# Patient Record
Sex: Male | Born: 1990
Health system: Southern US, Community
[De-identification: ages and names within clinical notes are randomized; demographics above are authoritative.]

## PROBLEM LIST (undated history)

## (undated) DIAGNOSIS — E669 Obesity, unspecified: Secondary | ICD-10-CM

## (undated) DIAGNOSIS — R6250 Unspecified lack of expected normal physiological development in childhood: Secondary | ICD-10-CM

## (undated) DIAGNOSIS — G40909 Epilepsy, unspecified, not intractable, without status epilepticus: Secondary | ICD-10-CM

## (undated) DIAGNOSIS — R7302 Impaired glucose tolerance (oral): Secondary | ICD-10-CM

## (undated) DIAGNOSIS — F79 Unspecified intellectual disabilities: Secondary | ICD-10-CM

## (undated) HISTORY — DX: Obesity, unspecified: E66.9

## (undated) HISTORY — DX: Epilepsy, unspecified, not intractable, without status epilepticus: G40.909

## (undated) HISTORY — DX: Impaired glucose tolerance (oral): R73.02

## (undated) HISTORY — PX: NO PAST SURGERIES: SHX2092

## (undated) HISTORY — DX: Unspecified lack of expected normal physiological development in childhood: R62.50

## (undated) HISTORY — DX: Unspecified intellectual disabilities: F79

---
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
## 2011-05-06 LAB — CBC
Hemoglobin: 13
MCHC: 33.6
RBC: 4.72
WBC: 15.3 — ABNORMAL HIGH

## 2011-05-06 LAB — BASIC METABOLIC PANEL
CO2: 25
Calcium: 9.3
Creatinine, Ser: 0.75
Sodium: 137

## 2011-05-06 LAB — DIFFERENTIAL
Basophils Relative: 0
Lymphocytes Relative: 5 — ABNORMAL LOW
Monocytes Absolute: 1.4 — ABNORMAL HIGH
Monocytes Relative: 9
Neutro Abs: 13 — ABNORMAL HIGH
Neutrophils Relative %: 85 — ABNORMAL HIGH

## 2011-05-06 LAB — URINALYSIS, ROUTINE W REFLEX MICROSCOPIC
Hgb urine dipstick: NEGATIVE
Nitrite: NEGATIVE
Specific Gravity, Urine: >1.03 — ABNORMAL HIGH
Urobilinogen, UA: 0.2
pH: 6

## 2011-05-06 LAB — CULTURE, BLOOD (ROUTINE X 2): Culture: NO GROWTH

## 2011-09-14 ENCOUNTER — Encounter: Payer: Self-pay | Admitting: Internal Medicine

## 2011-09-14 DIAGNOSIS — Z0000 Encounter for general adult medical examination without abnormal findings: Secondary | ICD-10-CM | POA: Insufficient documentation

## 2011-09-14 DIAGNOSIS — E669 Obesity, unspecified: Secondary | ICD-10-CM

## 2011-09-14 DIAGNOSIS — G40909 Epilepsy, unspecified, not intractable, without status epilepticus: Secondary | ICD-10-CM

## 2011-09-14 DIAGNOSIS — R6250 Unspecified lack of expected normal physiological development in childhood: Secondary | ICD-10-CM

## 2011-09-14 DIAGNOSIS — F79 Unspecified intellectual disabilities: Secondary | ICD-10-CM

## 2011-09-14 HISTORY — DX: Epilepsy, unspecified, not intractable, without status epilepticus: G40.909

## 2011-09-14 HISTORY — DX: Unspecified lack of expected normal physiological development in childhood: R62.50

## 2011-09-14 HISTORY — DX: Unspecified intellectual disabilities: F79

## 2011-09-14 HISTORY — DX: Obesity, unspecified: E66.9

## 2011-09-17 ENCOUNTER — Ambulatory Visit (INDEPENDENT_AMBULATORY_CARE_PROVIDER_SITE_OTHER): Payer: Medicare Other | Admitting: Internal Medicine

## 2011-09-17 ENCOUNTER — Encounter: Payer: Self-pay | Admitting: Internal Medicine

## 2011-09-17 VITALS — BP 104/70 | HR 60 | Temp 98.7°F | Ht 61.0 in | Wt 232.5 lb

## 2011-09-17 DIAGNOSIS — R21 Rash and other nonspecific skin eruption: Secondary | ICD-10-CM

## 2011-09-17 DIAGNOSIS — H1010 Acute atopic conjunctivitis, unspecified eye: Secondary | ICD-10-CM | POA: Insufficient documentation

## 2011-09-17 DIAGNOSIS — J019 Acute sinusitis, unspecified: Secondary | ICD-10-CM

## 2011-09-17 DIAGNOSIS — H1045 Other chronic allergic conjunctivitis: Secondary | ICD-10-CM

## 2011-09-17 MED ORDER — TRIAMCINOLONE ACETONIDE 0.1 % EX CREA: TOPICAL_CREAM | Freq: Two times a day (BID) | CUTANEOUS | Status: AC

## 2011-09-17 MED ORDER — OLOPATADINE HCL 0.1 % OP SOLN: 1.0000 [drp] | Freq: Two times a day (BID) | OPHTHALMIC | Status: AC

## 2011-09-17 MED ORDER — CEFDINIR 300 MG PO CAPS: 300.0000 mg | ORAL_CAPSULE | Freq: Two times a day (BID) | ORAL | Status: AC

## 2011-09-17 NOTE — Patient Instructions (Signed)
Take all new medications as prescribed Continue all other medications as before  

## 2011-09-22 ENCOUNTER — Encounter: Payer: Self-pay | Admitting: Internal Medicine

## 2011-09-22 NOTE — Assessment & Plan Note (Signed)
Mild to mod, for triam cream prn  course,  to f/u any worsening symptoms or concerns

## 2011-09-22 NOTE — Assessment & Plan Note (Signed)
Mild to mod, for patanol course,  to f/u any worsening symptoms or concerns

## 2011-09-22 NOTE — Progress Notes (Signed)
  Subjective:    Patient ID: Scott Curtis, male    DOB: 10-27-1990, 21 y.o.   MRN: [id number]  HPI  Here as new pt with mother;  Here with 3 days acute onset fever, facial pain, pressure, general weakness and malaise, and greenish d/c, with slight ST, but little to no cough and Pt denies chest pain, increased sob or doe, wheezing, orthopnea, PND, increased LE swelling, palpitations, dizziness or syncope. Does have several wks ongoing nasal  And eye allergy symptoms with clear congestion, itch and sneeze, without fever, pain, ST, cough or wheezing.Also with rash itchy to left knee, tends to spend quite a bit of time on his knees at home. Pt denies new neurological symptoms such as new headache, or facial or extremity weakness or numbness   Pt denies polydipsia, polyuria.  Past Medical History  Diagnosis Date  . Seizure disorder 09/14/2011  . Development delay 09/14/2011  . Mental retardation 09/14/2011  . Obesity 09/14/2011   No past surgical history on file.  reports that he has never smoked. He does not have any smokeless tobacco history on file. He reports that he does not drink alcohol or use illicit drugs. family history includes Arthritis in his other; Cancer in his other; and Hypertension in his other. Allergies  Allergen Reactions  . Codeine    No current outpatient prescriptions on file prior to visit.   Review of Systems Review of Systems  Constitutional: Negative for diaphoresis and unexpected weight change.  HENT: Negative for drooling and tinnitus.   Eyes: Negative for photophobia and visual disturbance.  Respiratory: Negative for choking and stridor.   Gastrointestinal: Negative for vomiting and blood in stool.  Genitourinary: Negative for hematuria and decreased urine volume.    Objective:   Physical Exam BP 104/70  Pulse 60  Temp(Src) 98.7 F (37.1 C) (Oral)  Ht 5\' 1"  (1.549 m)  Wt 232 lb 8 oz (105.461 kg)  BMI 43.93 kg/m2  SpO2 99% Physical Exam  VS noted, mild  ill Constitutional: Pt appears well-developed and well-nourished.  HENT: Head: Normocephalic.  Right Ear: External ear normal.  Left Ear: External ear normal. bilat conjucnt with mild watery d/c, mild redness bilat Bilat tm's mild erythema.  Sinus tender bilat.  Pharynx mild erythema Eyes: Conjunctivae and EOM are normal. Pupils are equal, round, and reactive to light.  Neck: Normal range of motion. Neck supple.  Cardiovascular: Normal rate and regular rhythm.   Pulmonary/Chest: Effort normal and breath sounds normal.  Abd:  Soft, NT, non-distended, + BS Neurological: Pt is alert. No cranial nerve deficit.  Skin: Skin is warm. No erythema.  Psychiatric: Pt behavior is normal. Thought content c/w developmental delay   Assessment & Plan:

## 2011-09-22 NOTE — Assessment & Plan Note (Signed)
Mild to mod, for antibx course,  to f/u any worsening symptoms or concerns 

## 2012-09-24 ENCOUNTER — Ambulatory Visit (INDEPENDENT_AMBULATORY_CARE_PROVIDER_SITE_OTHER): Payer: Medicare Other | Admitting: Internal Medicine

## 2012-09-24 ENCOUNTER — Encounter: Payer: Self-pay | Admitting: Internal Medicine

## 2012-09-24 VITALS — BP 102/70 | HR 103 | Temp 98.4°F | Ht 61.0 in | Wt 232.0 lb

## 2012-09-24 DIAGNOSIS — R7302 Impaired glucose tolerance (oral): Secondary | ICD-10-CM

## 2012-09-24 HISTORY — DX: Impaired glucose tolerance (oral): R73.02

## 2012-09-24 MED ORDER — DOXYCYCLINE HYCLATE 100 MG PO TABS: 100.0000 mg | ORAL_TABLET | Freq: Two times a day (BID) | ORAL | Status: DC

## 2012-09-24 NOTE — Patient Instructions (Addendum)
Please take all new medication as prescribed Please continue all other medications as before Please call if you would need to be referred to dermatology Please go to the LAB in the Basement (turn left off the elevator) for the tests to be done at your convenience You will be contacted by phone if any changes need to be made immediately.  Otherwise, you will receive a letter about your results with an explanation, but please check with MyChart first. Thank you for enrolling in MyChart. Please follow the instructions below to securely access your online medical record. MyChart allows you to send messages to your doctor, view your test results, renew your prescriptions, schedule appointments, and more. To Log into My Chart online, please go by [employer] or [employer] to [email or web address], or download the MyChart App from the [employer] of [employer] .  Your Username is: [email or web address] ([email or web address]) Please send a practice Message on Mychart later today. Please return in 1 year for your yearly visit, or sooner if needed

## 2012-09-24 NOTE — Assessment & Plan Note (Signed)

## 2012-09-24 NOTE — Assessment & Plan Note (Signed)
Asympt, for a1c 

## 2012-09-24 NOTE — Progress Notes (Signed)
  Subjective:    Patient ID: Scott Curtis, male    DOB: 1990-10-18, 22 y.o.   MRN: [id number]  HPI  Here with mother, Here for wellness and f/u;  Overall doing ok;  Pt denies CP, worsening SOB, DOE, wheezing, orthopnea, PND, worsening LE edema, palpitations, dizziness or syncope.  Pt denies neurological change such as new headache, facial or extremity weakness.  Pt denies polydipsia, polyuria, or low sugar symptoms. Pt states overall good compliance with treatment and medications, good tolerability, and has been trying to follow lower cholesterol diet.  Pt denies worsening depressive symptoms, suicidal ideation or panic. No fever, night sweats, wt loss, loss of appetite, or other constitutional symptoms.  Pt states good ability with ADL's, has low fall risk, home safety reviewed and adequate, no other significant changes in hearing or vision, and essentially no exercise as he spends all of his time playing video games Past Medical History  Diagnosis Date  . Seizure disorder 09/14/2011  . Development delay 09/14/2011  . Mental retardation 09/14/2011  . Obesity 09/14/2011  . Impaired glucose tolerance 09/24/2012   No past surgical history on file.  reports that he has never smoked. He does not have any smokeless tobacco history on file. He reports that he does not drink alcohol or use illicit drugs. family history includes Arthritis in his other; Cancer in his other; and Hypertension in his other. Allergies  Allergen Reactions  . Codeine    No current outpatient prescriptions on file prior to visit.   No current facility-administered medications on file prior to visit.   Review of Systems Constitutional: Negative for diaphoresis, activity change, appetite change or unexpected weight change.  HENT: Negative for hearing loss, ear pain, facial swelling, mouth sores and neck stiffness.   Eyes: Negative for pain, redness and visual disturbance.  Respiratory: Negative for shortness of breath and  wheezing.   Cardiovascular: Negative for chest pain and palpitations.  Gastrointestinal: Negative for diarrhea, blood in stool, abdominal distention or other pain Genitourinary: Negative for hematuria, flank pain or change in urine volume.  Musculoskeletal: Negative for myalgias and joint swelling.  Skin: Negative for color change and wound.  Neurological: Negative for syncope and numbness. other than noted Hematological: Negative for adenopathy.  Psychiatric/Behavioral: Negative for hallucinations, self-injury, decreased concentration and agitation.      Objective:   Physical Exam BP 102/70  Pulse 103  Temp(Src) 98.4 F (36.9 C) (Oral)  Ht 5\' 1"  (1.549 m)  Wt 232 lb (105.235 kg)  BMI 43.86 kg/m2  SpO2 94% VS noted,  Constitutional: Pt is oriented to person, place, and time. Appears well-developed and well-nourished.  Head: Normocephalic and atraumatic.  Right Ear: External ear normal.  Left Ear: External ear normal.  Nose: Nose normal.  Mouth/Throat: Oropharynx is clear and moist.  Eyes: Conjunctivae and EOM are normal. Pupils are equal, round, and reactive to light.  Neck: Normal range of motion. Neck supple. No JVD present. No tracheal deviation present.  Cardiovascular: Normal rate, regular rhythm, normal heart sounds and intact distal pulses.   Pulmonary/Chest: Effort normal and breath sounds normal.  Abdominal: Soft. Bowel sounds are normal. There is no tenderness. No HSM  Musculoskeletal: Normal range of motion. Exhibits no edema.  Lymphadenopathy:  Has no cervical adenopathy.  Neurological: Pt is alert . Pt has normal reflexes. No cranial nerve deficit. Motor/gait intact Skin: Skin is warm and dry. Has marked acne to bilat upper back    Assessment & Plan:

## 2012-09-24 NOTE — Assessment & Plan Note (Signed)
Flare to back - for doxy course,  to f/u any worsening symptoms or concerns

## 2012-12-11 ENCOUNTER — Telehealth: Payer: Self-pay

## 2012-12-11 NOTE — Telephone Encounter (Signed)
The patients mother is requesting something sent in for the patients ear to [facility] [address]  He is having drainage and an odor from his ears. Call back number is [phone]

## 2012-12-11 NOTE — Telephone Encounter (Signed)
Ok to come to office for visit now

## 2012-12-11 NOTE — Telephone Encounter (Signed)
Spoke to the mother and informed of MD instructions.  PCP was full and the mother scheduled the patient to be seen tomorrow at the Saturday clinic 12/12/12.

## 2012-12-11 NOTE — Telephone Encounter (Signed)
Called the patients mother Gweneth Dimitri) left message to call back

## 2012-12-12 ENCOUNTER — Ambulatory Visit (INDEPENDENT_AMBULATORY_CARE_PROVIDER_SITE_OTHER): Payer: Medicare Other | Admitting: Family Medicine

## 2012-12-12 ENCOUNTER — Encounter: Payer: Self-pay | Admitting: Family Medicine

## 2012-12-12 VITALS — BP 118/78 | HR 68 | Temp 98.1°F | Wt 233.0 lb

## 2012-12-12 DIAGNOSIS — H6123 Impacted cerumen, bilateral: Secondary | ICD-10-CM

## 2012-12-12 DIAGNOSIS — H6120 Impacted cerumen, unspecified ear: Secondary | ICD-10-CM

## 2012-12-12 DIAGNOSIS — H60399 Other infective otitis externa, unspecified ear: Secondary | ICD-10-CM

## 2012-12-12 DIAGNOSIS — H60391 Other infective otitis externa, right ear: Secondary | ICD-10-CM

## 2012-12-12 MED ORDER — NEOMYCIN-POLYMYXIN-HC 3.5-10000-1 OT SUSP: 3.0000 [drp] | Freq: Four times a day (QID) | OTIC | Status: DC

## 2012-12-12 NOTE — Progress Notes (Signed)
Date:  12/12/2012   Name:  Scott Curtis   DOB:  1990-11-08   MRN:  [id number] Gender: male Age: 22 y.o.  Primary Physician:  [name], MD  Evaluating MD: Hannah Beat, MD   Chief Complaint: Ear Fullness   History of Present Illness:  Scott Curtis is a 22 y.o. pleasant patient who presents with the following:  R ear pain and smell; pleasant non-verbal male. R ear tugging this week and mom worried about infection. Smell coming from the R ear also. No systemic sx.  Patient Active Problem List   Diagnosis Date Noted  . Impaired glucose tolerance 09/24/2012  . Acne 09/24/2012  . Allergic conjunctivitis 09/17/2011  . Seizure disorder 09/14/2011  . Preventative health care 09/14/2011  . Development delay 09/14/2011  . Mental retardation 09/14/2011  . Obesity 09/14/2011    Past Medical History  Diagnosis Date  . Seizure disorder 09/14/2011  . Development delay 09/14/2011  . Mental retardation 09/14/2011  . Obesity 09/14/2011  . Impaired glucose tolerance 09/24/2012    No past surgical history on file.  History   Social History  . Marital Status: Single    Spouse Name: N/A    Number of Children: N/A  . Years of Education: N/A   Occupational History  . Not on file.   Social History Main Topics  . Smoking status: Never Smoker   . Smokeless tobacco: Not on file  . Alcohol Use: No  . Drug Use: No  . Sexually Active: Not on file   Other Topics Concern  . Not on file   Social History Narrative  . No narrative on file    Family History  Problem Relation Age of Onset  . Arthritis Other   . Hypertension Other   . Cancer Other     breast cancer    Allergies  Allergen Reactions  . Codeine     Medication list has been reviewed and updated.  Outpatient Prescriptions Prior to Visit  Medication Sig Dispense Refill  . doxycycline (VIBRA-TABS) 100 MG tablet Take 1 tablet (100 mg total) by mouth 2 (two) times daily.  20 tablet  0   No  facility-administered medications prior to visit.    Review of Systems:   GEN: No acute illnesses, no fevers, chills. GI: No n/v/d, eating normally Pulm: No SOB Interactive and getting along well at home.  Otherwise, ROS is as per the HPI.   Physical Examination: BP 118/78  Temp(Src) 98.1 F (36.7 C) (Oral)  Wt 233 lb (105.688 kg)  BMI 44.05 kg/m2  Ideal Body Weight:     GEN: WDWN, NAD, Non-toxic, Alert & Oriented x 3 HEENT: Atraumatic, Normocephalic. Cerumen impaction B. After cleaning, TM clear, canal swollen on R. Ears and Nose: No external deformity. EXTR: No clubbing/cyanosis/edema NEURO: Normal gait.  PSYCH: Normally interactive. Conversant. Not depressed or anxious appearing.  Calm demeanor.    Assessment and Plan:  Otitis, externa, infective, right  Cerumen impaction, bilateral  Canals cleaned of wax. R ear remains suspicious for oe.  Orders Today:  No orders of the defined types were placed in this encounter.    Updated Medication List: (Includes new medications, updates to list, dose adjustments) Meds ordered this encounter  Medications  . neomycin-polymyxin-hydrocortisone (CORTISPORIN) 3.5-10000-1 otic suspension    Sig: Place 3 drops into the right ear 4 (four) times daily.    Dispense:  10 mL    Refill:  0    Medications Discontinued: Medications  Discontinued During This Encounter  Medication Reason  . doxycycline (VIBRA-TABS) 100 MG tablet       Signed, Sharda Keddy T. Bernese Doffing, MD 12/12/2012 9:05 AM

## 2012-12-12 NOTE — Addendum Note (Signed)
Addended by: Kern Reap B on: 12/12/2012 10:20 AM   Modules accepted: Orders

## 2013-04-06 ENCOUNTER — Ambulatory Visit: Payer: Medicare Other | Admitting: Internal Medicine

## 2013-04-06 ENCOUNTER — Ambulatory Visit (INDEPENDENT_AMBULATORY_CARE_PROVIDER_SITE_OTHER): Payer: Medicare Other | Admitting: Internal Medicine

## 2013-04-06 ENCOUNTER — Encounter: Payer: Self-pay | Admitting: Internal Medicine

## 2013-04-06 VITALS — BP 110/80 | HR 118 | Temp 97.0°F

## 2013-04-06 DIAGNOSIS — R7309 Other abnormal glucose: Secondary | ICD-10-CM

## 2013-04-06 DIAGNOSIS — H60391 Other infective otitis externa, right ear: Secondary | ICD-10-CM

## 2013-04-06 DIAGNOSIS — H60399 Other infective otitis externa, unspecified ear: Secondary | ICD-10-CM | POA: Insufficient documentation

## 2013-04-06 DIAGNOSIS — R7302 Impaired glucose tolerance (oral): Secondary | ICD-10-CM

## 2013-04-06 MED ORDER — NEOMYCIN-POLYMYXIN-HC 3.5-10000-1 OT SUSP: 3.0000 [drp] | Freq: Four times a day (QID) | OTIC | Status: DC

## 2013-04-06 MED ORDER — CIPROFLOXACIN HCL 500 MG PO TABS: 500.0000 mg | ORAL_TABLET | Freq: Two times a day (BID) | ORAL | Status: DC
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
## 2013-04-06 NOTE — Patient Instructions (Addendum)
You have a right ear canal infection today We tried to irrigate (wash out) with water some of the mucous in the canal Please take all new medication as prescribed - the ear drops, but also the pill antibiotic in case the drops do not work well Please continue all other medications as before, and refills have been done if requested.  Please remember to sign up for My Chart if you have not done so, as this will be important to you in the future with finding out test results, communicating by private email, and scheduling acute appointments online when needed.  Please return in 3 months, or sooner if needed

## 2013-04-06 NOTE — Assessment & Plan Note (Signed)
?   Control, but decliens labs at this time, f/u 3 mo

## 2013-04-06 NOTE — Assessment & Plan Note (Signed)
Mild to mod, for antibx course, we did try to irrigate today without complete results, consider ENT if persists, to f/u any worsening symptoms or concerns

## 2013-04-19 ENCOUNTER — Telehealth: Payer: Self-pay | Admitting: *Deleted

## 2013-04-19 ENCOUNTER — Other Ambulatory Visit: Payer: Self-pay | Admitting: Internal Medicine

## 2013-04-19 DIAGNOSIS — H60399 Other infective otitis externa, unspecified ear: Secondary | ICD-10-CM

## 2013-04-19 MED ORDER — CEFDINIR 300 MG PO CAPS: 300.0000 mg | ORAL_CAPSULE | Freq: Two times a day (BID) | ORAL | Status: DC

## 2013-04-19 NOTE — Telephone Encounter (Signed)
[name] pts mother called states Ciprofloxacin is causing abd pain, requesting a different antibiotic.  Please advise in Dr Melvyn Novas absence

## 2013-04-19 NOTE — Telephone Encounter (Signed)
Switched to omnicef

## 2013-04-19 NOTE — Telephone Encounter (Signed)
Spoke with Gweneth Dimitri advised of Medication change.

## 2013-05-27 ENCOUNTER — Other Ambulatory Visit: Payer: Self-pay

## 2013-08-06 ENCOUNTER — Telehealth: Payer: Self-pay

## 2013-08-06 ENCOUNTER — Ambulatory Visit (INDEPENDENT_AMBULATORY_CARE_PROVIDER_SITE_OTHER): Payer: Medicare Other | Admitting: Physician Assistant

## 2013-08-06 ENCOUNTER — Encounter: Payer: Self-pay | Admitting: Physician Assistant

## 2013-08-06 VITALS — BP 120/72 | HR 72 | Temp 98.3°F | Wt 237.8 lb

## 2013-08-06 DIAGNOSIS — H608X9 Other otitis externa, unspecified ear: Secondary | ICD-10-CM

## 2013-08-06 DIAGNOSIS — H6060 Unspecified chronic otitis externa, unspecified ear: Secondary | ICD-10-CM

## 2013-08-06 NOTE — Telephone Encounter (Signed)
Unfort, we are not supposed to tx with antibx by phone; can he come to see PA today?

## 2013-08-06 NOTE — Progress Notes (Signed)
   Subjective:    Patient ID: Scott Curtis, male    DOB: June 22, 1991, 23 y.o.   MRN: [id number]  HPI Comments: Patient is a 23 year old male who present to the office with complaint of right ear pain. Patient is accompanied by his parents. Mother is primary historian. Mother explains patient has been seen in this office for same complaint. Reports patient diagnosed with ear infection in past, provided Rx for antibiotic otic drops which were switched to an oral med. Mother states stills has drops at home. Mother explains due to patient's developmental issues he has always had small tight ear canals with frequent infections. Reports the patient will not leave his right ear alone and continues to pick at the entrance to the right ear canal. Mother noticed the ear has had drainage in the last couple days that "stinks". States she is concerned the ear is infected again.    Review of Systems  Constitutional: Negative for fever and chills.  HENT: Positive for ear discharge ("dirty" and "stinky" per mother) and ear pain (per mother). Negative for facial swelling, rhinorrhea, sneezing and trouble swallowing.   Eyes: Negative for visual disturbance.  Neurological: Negative for dizziness and light-headedness.  All other systems reviewed and are negative.    Objective:  Physical Exam  Vitals reviewed. Constitutional: He appears well-developed and well-nourished. No distress.  Patient seated comfortably in chair, in NAD. Watching notebook screen, picking at right ear intermittently.   HENT:  Head: Normocephalic and atraumatic.  Right Ear: External ear normal.  Left Ear: External ear normal.  Narrowing of right ear canal. Cerumen noted to canal however not completely occluded. No drainage, erythema or odor noted. External ear WNL. No pain noted during inspection.      Past Medical History  Diagnosis Date  . Seizure disorder 09/14/2011  . Development delay 09/14/2011  . Mental retardation 09/14/2011  .  Obesity 09/14/2011  . Impaired glucose tolerance 09/24/2012   Lab Results  Component Value Date   WBC 15.3* 03/05/2007   HGB 13.0 03/05/2007   HCT 38.8 03/05/2007   PLT 290 03/05/2007   GLUCOSE 126* 03/05/2007   NA 137 03/05/2007   K 4.0 03/05/2007   CL 104 03/05/2007   CREATININE 0.75 03/05/2007   BUN 8 03/05/2007   CO2 25 03/05/2007     Assessment & Plan:   Otitis externa Discussed with patients mother physical exam findings, can not rule out oe. Instructed to continue use of Cipro drops and will refer to ENT.

## 2013-08-06 NOTE — Telephone Encounter (Signed)
The patient's mother called in and is hoping to get the patient a medication called in for his ear.   Mom - [phone]

## 2013-08-06 NOTE — Patient Instructions (Signed)
Very nice to meet all of your today!  Continue use of ear drops as prescribed. I have ordered referral to ENT.  Otitis Externa Otitis externa is a bacterial or fungal infection of the outer ear canal. This is the area from the eardrum to the outside of the ear. Otitis externa is sometimes called "swimmer's ear." CAUSES  Possible causes of infection include:  Swimming in dirty water.  Moisture remaining in the ear after swimming or bathing.  Mild injury (trauma) to the ear.  Objects stuck in the ear (foreign body).  Cuts or scrapes (abrasions) on the outside of the ear. SYMPTOMS  The first symptom of infection is often itching in the ear canal. Later signs and symptoms may include swelling and redness of the ear canal, ear pain, and yellowish-white fluid (pus) coming from the ear. The ear pain may be worse when pulling on the earlobe. DIAGNOSIS  Your caregiver will perform a physical exam. A sample of fluid may be taken from the ear and examined for bacteria or fungi. TREATMENT  Antibiotic ear drops are often given for 10 to 14 days. Treatment may also include pain medicine or corticosteroids to reduce itching and swelling. PREVENTION   Keep your ear dry. Use the corner of a towel to absorb water out of the ear canal after swimming or bathing.  Avoid scratching or putting objects inside your ear. This can damage the ear canal or remove the protective wax that lines the canal. This makes it easier for bacteria and fungi to grow.  Avoid swimming in lakes, polluted water, or poorly chlorinated pools.  You may use ear drops made of rubbing alcohol and vinegar after swimming. Combine equal parts of white vinegar and alcohol in a bottle. Put 3 or 4 drops into each ear after swimming. HOME CARE INSTRUCTIONS   Apply antibiotic ear drops to the ear canal as prescribed by your caregiver.  Only take over-the-counter or prescription medicines for pain, discomfort, or fever as directed by  your caregiver.  If you have diabetes, follow any additional treatment instructions from your caregiver.  Keep all follow-up appointments as directed by your caregiver. SEEK MEDICAL CARE IF:   You have a fever.  Your ear is still red, swollen, painful, or draining pus after 3 days.  Your redness, swelling, or pain gets worse.  You have a severe headache.  You have redness, swelling, pain, or tenderness in the area behind your ear. MAKE SURE YOU:   Understand these instructions.  Will watch your condition.  Will get help right away if you are not doing well or get worse. Document Released: 07/08/2005 Document Revised: 09/30/2011 Document Reviewed: 07/25/2011 [facility] Patient Information 2014 [place], [place].

## 2013-08-06 NOTE — Telephone Encounter (Signed)
Mother informed and did agree to appointment.

## 2013-08-06 NOTE — Progress Notes (Signed)
Pre-visit discussion using our clinic review tool. No additional management support is needed unless otherwise documented below in the visit note.  

## 2014-01-14 ENCOUNTER — Ambulatory Visit (INDEPENDENT_AMBULATORY_CARE_PROVIDER_SITE_OTHER): Payer: Medicare Other | Admitting: Internal Medicine

## 2014-01-14 ENCOUNTER — Encounter: Payer: Self-pay | Admitting: Internal Medicine

## 2014-01-14 VITALS — HR 73 | Wt 230.8 lb

## 2014-01-14 DIAGNOSIS — B369 Superficial mycosis, unspecified: Secondary | ICD-10-CM

## 2014-01-14 DIAGNOSIS — H6123 Impacted cerumen, bilateral: Secondary | ICD-10-CM

## 2014-01-14 DIAGNOSIS — L738 Other specified follicular disorders: Secondary | ICD-10-CM

## 2014-01-14 DIAGNOSIS — L03011 Cellulitis of right finger: Secondary | ICD-10-CM

## 2014-01-14 DIAGNOSIS — H6120 Impacted cerumen, unspecified ear: Secondary | ICD-10-CM

## 2014-01-14 DIAGNOSIS — H6240 Otitis externa in other diseases classified elsewhere, unspecified ear: Secondary | ICD-10-CM

## 2014-01-14 DIAGNOSIS — L739 Follicular disorder, unspecified: Secondary | ICD-10-CM

## 2014-01-14 DIAGNOSIS — L03019 Cellulitis of unspecified finger: Secondary | ICD-10-CM

## 2014-01-14 MED ORDER — KETOCONAZOLE 2 % EX CREA: 1.0000 "application " | TOPICAL_CREAM | Freq: Two times a day (BID) | CUTANEOUS | Status: DC

## 2014-01-14 MED ORDER — CEPHALEXIN 500 MG PO CAPS: 500.0000 mg | ORAL_CAPSULE | Freq: Four times a day (QID) | ORAL | Status: DC

## 2014-01-14 NOTE — Progress Notes (Signed)
   Subjective:    Patient ID: Scott Curtis, male    DOB: 1991/02/19, 23 y.o.   MRN: [id number]  HPI Pt presents today with parents for four different issues. The pt is mentally handicapped and thus the HPI is given by his parents.  Finger infection: Yesterday the pt's mom noticed the middle finger of his R hand was discolored with what she believed was a pus pocket. She thinks he may have closed his finger in a door. It has not gotten worse since she noticed it.   R ear fungus: Pt's mom reports he has had a chronic fungal infection of the skin around his R ear for about a month. He was seeing his developmental specialist who gave him a prescription cream for the ear fungus, at which time it resolved. It has since come back, as the pt is out of the cream. The ear is itchy and the mom reports he is always touching it.   Dry skin: The pt's mom reports she has noticed he has dry skin on his back. She is unsure of when this first began but states he does have a history of eczema. She thought it looked like acne on his back and reports he is constantly scratching his back.   Eye drainage: The pt has been suffering from allergies for the past couple of weeks. He has had red watery eyes per pt's mom. He has been taking claritin but his mom reports every morning his L eye is "all gunked up". The gunk is white in color and all in his eyelashes. The eye is not glued shut. The eye does not seem to bother the pt; he does not scratch at the eye.      Review of Systems  Constitutional: Negative for fever and chills.  HENT: Negative for congestion, ear discharge, ear pain and sore throat.   Eyes: Negative for itching.  Respiratory: Negative for cough and shortness of breath.        Objective:   Physical Exam  Dry flaking skin on R pinna and auricle. Unable to visualize TMs bilaterally d/t cerumen  R hand middle finger      Assessment & Plan:  #1 paronychia; bactrim or keflex  #2 eczema vs. Fungus on  ear;  #3 eczema vs. Folliculitis on back #4 conjunctivitis; most likely allergic component with red watery eyes. Unable to see conjuctiva as pt would not open his eyes. Would give erythromycin ointment to be applied to eyelid.

## 2014-01-14 NOTE — Progress Notes (Signed)
Pre visit review using our clinic review tool, if applicable. No additional management support is needed unless otherwise documented below in the visit note. 

## 2014-01-14 NOTE — Progress Notes (Signed)
   Subjective:    Patient ID: Scott Curtis, male    DOB: 1990-12-12, 23 y.o.   MRN: [id number]  HPI  This young man was brought in by his parents to address your issues. The patient is minimally and she; his parents are historians.  6/25 his mother noted that the distal aspect of his third right finger was discolored with what appeared to be a pus pocket. She questioned possible trauma. He has been stable over the last 24 hours.  He's had chronic fungal infection skin around his right ear for a month. His developmental specialist given a prescription for cream which did help. It has now come back to green. The ear itches and he is. Light touch.  She's concerned about dry skin of the back with a question of acneiform rash. This leads to him really scratching his back  She questions whether he is been having extrinsic conjunctivitis for several weeks. He has watery eyes for which he's been using Claritin. In the morning his mother states his eyes are "all gone". The mucus is white without purulence. He has not been scratching her    Review of Systems Fever, chills, sweats are denied  There is an no complaining of frontal headache, facial pain, nasal purulence, or otic discharge.  There is no associated cough or sputum production. r     Objective:   Physical Exam  He is short and stocky.  He is nonverbal.  He appears to be in no acute distress.  Marked decreased excursion of mandible.    The external right ear is dry and scaly.  Wax is noted bilaterally.  The conjunctiva reveal minimal erythema with no purulence. There is no scleral icterus.   Chest is clear to auscultation with no increased work of breathing or abnormal breath sounds  He has an S4 without murmurs or gallops.  Abdomen protuberant.  Lymphadenopathy cannot be appreciated neck or axilla. Paronychia is noted at the nailbed of the third right finger diffuse folliculitis type lesions are noted over the  upper back.        Assessment & Plan:

## 2014-01-14 NOTE — Patient Instructions (Addendum)
Soak finger twice a day in saline. The saline can be purchased at the drugstore or you can make your own .Boil cup of salt in a gallon of water. Store mixture  in a clean container.Report Warning  signs as discussed (red streaks, pus, fever, increasing pain).  Nizoral applied topically and blown  dry with hair dryer after saline soaks twice a day. Nizoral can also be used on the ear.  Please do not use Q-tips . Should wax build up occur, please put 2-3 drops of mineral oil in the affected  ear at night to soften the wax .Cover the canal with a  cotton ball to prevent the oil from staining bed linens. In the morning fill the ear canal with hydrogen peroxide & lie in the opposite lateral decubitus position(on the side opposite the affected ear)  for 10-15 minutes. After allowing this period of time for the peroxide to dissolve the wax ;shower and use the thinnest washrag available to wick out the wax. If both ears are involved ; alternate this treatment from ear to ear each night until no wax is found on the washrag.    External cleansing of tightly closed eyes in the shower  with lather of no tears  shampoo.After 10 seconds wash off lather completely

## 2014-03-01 ENCOUNTER — Telehealth: Payer: Self-pay | Admitting: Internal Medicine

## 2014-03-01 NOTE — Telephone Encounter (Signed)
Virl CageyLouise Welker PHD Fax [phone]

## 2014-03-01 NOTE — Telephone Encounter (Signed)
Faxed as requested by the patients mother the patients Guardianship Paperwork to Virl CageyLouise Welker PHD at [phone]

## 2014-03-02 ENCOUNTER — Telehealth: Payer: Self-pay

## 2014-03-02 DIAGNOSIS — R6250 Unspecified lack of expected normal physiological development in childhood: Secondary | ICD-10-CM

## 2014-03-02 DIAGNOSIS — F79 Unspecified intellectual disabilities: Secondary | ICD-10-CM

## 2014-03-02 NOTE — Telephone Encounter (Signed)
Received a phone call from Virl CageyLouise Welker PHD an independent psychologist who is seeing this patient for an evaluation.  To satisfy Medicaid she needs a written referral faxed to her office at [phone].  She stated it could be faxed from this telephone note or written on a prescription.

## 2014-03-02 NOTE — Telephone Encounter (Signed)
Done per emr 

## 2014-05-25 ENCOUNTER — Telehealth (HOSPITAL_COMMUNITY): Payer: Self-pay | Admitting: *Deleted

## 2014-05-25 NOTE — Telephone Encounter (Signed)
Talked to pts mother & stated not interested in bringing pt in if he is not sick because he does not do good in doctor's office since he is a "special needs child.

## 2014-10-26 ENCOUNTER — Ambulatory Visit (INDEPENDENT_AMBULATORY_CARE_PROVIDER_SITE_OTHER): Payer: Medicare Other | Admitting: Internal Medicine

## 2014-10-26 ENCOUNTER — Encounter: Payer: Self-pay | Admitting: Internal Medicine

## 2014-10-26 VITALS — BP 118/82 | HR 83 | Temp 97.4°F | Resp 18 | Wt 232.1 lb

## 2014-10-26 DIAGNOSIS — J019 Acute sinusitis, unspecified: Secondary | ICD-10-CM | POA: Diagnosis not present

## 2014-10-26 DIAGNOSIS — R7302 Impaired glucose tolerance (oral): Secondary | ICD-10-CM | POA: Diagnosis not present

## 2014-10-26 DIAGNOSIS — J309 Allergic rhinitis, unspecified: Secondary | ICD-10-CM

## 2014-10-26 MED ORDER — AZITHROMYCIN 250 MG PO TABS: ORAL_TABLET | ORAL | Status: DC

## 2014-10-26 MED ORDER — PREDNISONE 10 MG PO TABS: ORAL_TABLET | ORAL | Status: DC

## 2014-10-26 NOTE — Progress Notes (Signed)
   Subjective:    Patient ID: Scott Curtis, male    DOB: 19-Jul-1991, 24 y.o.   MRN: [id number]  HPI   Here with 2-3 days acute onset fever, facial pain, pressure, headache, general weakness and malaise, and greenish d/c, with mild ST and cough, but pt denies chest pain, wheezing, increased sob or doe, orthopnea, PND, increased LE swelling, palpitations, dizziness or syncope.  Does have several wks ongoing nasal allergy symptoms with clearish congestion, itch and sneezing, without fever, pain, ST, cough, swelling or wheezing.   Pt denies polydipsia, polyuria, did have elev blood sugar 126 several yrs ago, has refused all fingerstick and labs since then Past Medical History  Diagnosis Date  . Seizure disorder 09/14/2011  . Development delay 09/14/2011  . Mental retardation 09/14/2011  . Obesity 09/14/2011  . Impaired glucose tolerance 09/24/2012   No past surgical history on file.  reports that he has never smoked. He does not have any smokeless tobacco history on file. He reports that he does not drink alcohol or use illicit drugs. family history includes Arthritis in his other; Cancer in his other; Hypertension in his other. Allergies  Allergen Reactions  . Codeine    Current Outpatient Prescriptions on File Prior to Visit  Medication Sig Dispense Refill  . cephALEXin (KEFLEX) 500 MG capsule Take 1 capsule (500 mg total) by mouth 4 (four) times daily. (Patient not taking: Reported on 10/26/2014) 20 capsule 0  . ketoconazole (NIZORAL) 2 % cream Apply 1 application topically 2 (two) times daily. To ear & finger (Patient not taking: Reported on 10/26/2014) 30 g 0   No current facility-administered medications on file prior to visit.   Review of Systems  Constitutional: Negative for unusual diaphoresis or night sweats HENT: Negative for ringing in ear or discharge Eyes: Negative for double vision or worsening visual disturbance.  Respiratory: Negative for choking and stridor.   Gastrointestinal:  Negative for vomiting or other signifcant bowel change Genitourinary: Negative for hematuria or change in urine volume.  Musculoskeletal: Negative for other MSK pain or swelling Skin: Negative for color change and worsening wound.  Neurological: Negative for tremors and numbness other than noted  Psychiatric/Behavioral: Negative for decreased concentration or agitation other than above       Objective:   Physical Exam BP 118/82 mmHg  Pulse 83  Temp(Src) 97.4 F (36.3 C) (Oral)  Resp 18  Wt 232 lb 1.3 oz (105.271 kg)  SpO2 94% VS noted, mild ill Constitutional: Pt appears in no significant distress HENT: Head: NCAT.  Right Ear: External ear normal.  Left Ear: External ear normal.  Eyes: . Pupils are equal, round, and reactive to light. Conjunctivae and EOM are normal Bilat tm's with mild erythema.  Max sinus areas mild tender.  Pharynx with mild erythema, no exudate Neck: Normal range of motion. Neck supple.  Cardiovascular: Normal rate and regular rhythm.   Pulmonary/Chest: Effort normal and breath sounds without rales or wheezing.  Abd:  Soft, NT, ND, + BS Neurological: Pt is alert. Not confused , motor grossly intact Skin: Skin is warm. No rash, no LE edema Psychiatric: Pt behavior is normal. No agitation.      Assessment & Plan:

## 2014-10-26 NOTE — Assessment & Plan Note (Signed)
Refused cbg in office today, encouraged to follow lower cal diet, increase activity, for wt loss

## 2014-10-26 NOTE — Progress Notes (Signed)
Pre visit review using our clinic review tool, if applicable. No additional management support is needed unless otherwise documented below in the visit note. 

## 2014-10-26 NOTE — Patient Instructions (Signed)
Please take all new medication as prescribed - the antibiotic, and prednisone  Please continue all other medications as before, and refills have been done if requested.  Please have the pharmacy call with any other refills you may need.  Please continue your efforts at being more active, low cholesterol diet, and weight control.  Please keep your appointments with your specialists as you may have planned  Please return if you change your mind about having the blood tests

## 2014-10-26 NOTE — Assessment & Plan Note (Signed)
For short low prednisone, declines all other tx

## 2014-10-26 NOTE — Assessment & Plan Note (Signed)
Mild to mod, for antibx course,  to f/u any worsening symptoms or concerns 

## 2014-11-10 ENCOUNTER — Telehealth: Payer: Self-pay | Admitting: Internal Medicine

## 2014-11-10 MED ORDER — LEVOFLOXACIN 500 MG PO TABS: 500.0000 mg | ORAL_TABLET | Freq: Every day | ORAL | Status: DC

## 2014-11-10 NOTE — Telephone Encounter (Signed)
Ok for levaquin asd - done erx °

## 2014-11-10 NOTE — Telephone Encounter (Signed)
Pt mother called in and said that meds did not work that was give on the 6th.  Can something else be called in ?     [phone]  [facility]

## 2014-11-11 NOTE — Telephone Encounter (Signed)
Called pt mother no answer LMOM with md response...Raechel Chute/lmb

## 2014-11-25 ENCOUNTER — Telehealth: Payer: Self-pay | Admitting: Internal Medicine

## 2014-11-25 NOTE — Telephone Encounter (Signed)
Left message for patient to call back  

## 2014-11-25 NOTE — Telephone Encounter (Signed)
Pt mother called in said that she would like a call back from a nurse.  She said every time he eats his nose and eyes run?    Best number [phone]-

## 2015-04-06 ENCOUNTER — Encounter: Payer: Self-pay | Admitting: Internal Medicine

## 2015-04-06 ENCOUNTER — Ambulatory Visit (INDEPENDENT_AMBULATORY_CARE_PROVIDER_SITE_OTHER): Payer: Medicare Other | Admitting: Internal Medicine

## 2015-04-06 VITALS — BP 122/78 | HR 66 | Temp 98.4°F | Wt 233.0 lb

## 2015-04-06 DIAGNOSIS — R21 Rash and other nonspecific skin eruption: Secondary | ICD-10-CM

## 2015-04-06 DIAGNOSIS — J019 Acute sinusitis, unspecified: Secondary | ICD-10-CM

## 2015-04-06 DIAGNOSIS — J309 Allergic rhinitis, unspecified: Secondary | ICD-10-CM | POA: Diagnosis not present

## 2015-04-06 MED ORDER — OLOPATADINE HCL 0.2 % OP SOLN: OPHTHALMIC | Status: DC

## 2015-04-06 MED ORDER — CEFDINIR 300 MG PO CAPS: 300.0000 mg | ORAL_CAPSULE | Freq: Two times a day (BID) | ORAL | Status: DC

## 2015-04-06 MED ORDER — PREDNISONE 10 MG PO TABS: ORAL_TABLET | ORAL | Status: DC

## 2015-04-06 MED ORDER — CLOTRIMAZOLE-BETAMETHASONE 1-0.05 % EX CREA: TOPICAL_CREAM | CUTANEOUS | Status: DC

## 2015-04-06 NOTE — Patient Instructions (Signed)
Please take all new medication as prescribed - the antibiotic, pataday, prednisone, and lotrisone cream (all generic)  Please continue all other medications as before, and refills have been done if requested.  Please have the pharmacy call with any other refills you may need.  Please keep your appointments with your specialists as you may have planned

## 2015-04-06 NOTE — Progress Notes (Signed)
Pre visit review using our clinic review tool, if applicable. No additional management support is needed unless otherwise documented below in the visit note. 

## 2015-04-06 NOTE — Progress Notes (Signed)
   Subjective:    Patient ID: Scott Curtis, Scott Curtis    DOB: 1991/06/05, 24 y.o.   MRN: [id number]  HPI  Here with mother -  Here with 2-3 days acute onset fever, facial pain, pressure, headache, general weakness and malaise, and greenish d/c, with mild ST and cough, but pt denies chest pain, wheezing, increased sob or doe, orthopnea, PND, increased LE swelling, palpitations, dizziness or syncope.Does have several wks ongoing nasal allergy symptoms with clearish congestion, itch and sneezing, without fever, pain, ST, cough, swelling or wheezing, but does eye itching as well. Also with bilat ear congestion and popping, hearing decreased.  Also with itchy rash to right face just in front or ear.   Past Medical History  Diagnosis Date  . Seizure disorder 09/14/2011  . Development delay 09/14/2011  . Mental retardation 09/14/2011  . Obesity 09/14/2011  . Impaired glucose tolerance 09/24/2012   History reviewed. No pertinent past surgical history.  reports that he has never smoked. He does not have any smokeless tobacco history on file. He reports that he does not drink alcohol or use illicit drugs. family history includes Arthritis in his other; Cancer in his other; Hypertension in his other. Allergies  Allergen Reactions  . Codeine    Current Outpatient Prescriptions on File Prior to Visit  Medication Sig Dispense Refill  . cephALEXin (KEFLEX) 500 MG capsule Take 1 capsule (500 mg total) by mouth 4 (four) times daily. (Patient not taking: Reported on 10/26/2014) 20 capsule 0  . ketoconazole (NIZORAL) 2 % cream Apply 1 application topically 2 (two) times daily. To ear & finger (Patient not taking: Reported on 10/26/2014) 30 g 0  . levofloxacin (LEVAQUIN) 500 MG tablet Take 1 tablet (500 mg total) by mouth daily. (Patient not taking: Reported on 04/06/2015) 10 tablet 0   No current facility-administered medications on file prior to visit.   Review of Systems  Constitutional: Negative for unusual  diaphoresis or night sweats HENT: Negative for ringing in ear or discharge Eyes: Negative for double vision or worsening visual disturbance.  Respiratory: Negative for choking and stridor.   Gastrointestinal: Negative for vomiting or other signifcant bowel change Genitourinary: Negative for hematuria or change in urine volume.  Musculoskeletal: Negative for other MSK pain or swelling Skin: Negative for color change and worsening wound.  Neurological: Negative for tremors and numbness other than noted  Psychiatric/Behavioral: Negative for decreased concentration or agitation other than above       Objective:   Physical Exam BP 122/78 mmHg  Pulse 66  Temp(Src) 98.4 F (36.9 C)  Wt 233 lb (105.688 kg)  SpO2 98% VS noted, mild ill Constitutional: Pt appears in no significant distress HENT: Head: NCAT.  Right Ear: External ear normal.  Left Ear: External ear normal.  Eyes: . Pupils are equal, round, and reactive to light. Conjunctivae with mild erythema , no d/c and EOM are normal Bilat tm's with mild erythema.  Max sinus areas mild tender.  Pharynx with mild erythema, no exudate Neck: Normal range of motion. Neck supple.  Cardiovascular: Normal rate and regular rhythm.   Pulmonary/Chest: Effort normal and breath sounds without rales or wheezing.  Neurological: Pt is alert. Not confused , motor grossly intact Skin: Skin is warm. + rash erythem, scaly slight raised .5 x 1 cm preauricular, NT,  no LE edema Psychiatric: Pt behavior is normal. No agitation.     Assessment & Plan:

## 2015-04-08 NOTE — Assessment & Plan Note (Signed)
Noninfectious, possible allergic, for lotrisone prn,  to f/u any worsening symptoms or concerns

## 2015-04-08 NOTE — Assessment & Plan Note (Addendum)
Mild to mod, for antibx course,  to f/u any worsening symptoms or concerns, also for mucinex otc prn 

## 2015-04-08 NOTE — Assessment & Plan Note (Signed)
Also with conjunctivitis related, for depomedrol, predpac asd, pataday rx asd

## 2016-07-25 ENCOUNTER — Emergency Department (HOSPITAL_COMMUNITY): Payer: Medicare Other

## 2016-07-25 ENCOUNTER — Encounter (HOSPITAL_COMMUNITY): Payer: Self-pay | Admitting: Emergency Medicine

## 2016-07-25 ENCOUNTER — Emergency Department (HOSPITAL_COMMUNITY)
Admission: EM | Admit: 2016-07-25 | Discharge: 2016-07-25 | Disposition: A | Discharge status: Home / Self Care | Payer: Medicare Other | Attending: Emergency Medicine | Admitting: Emergency Medicine

## 2016-07-25 DIAGNOSIS — R319 Hematuria, unspecified: Secondary | ICD-10-CM | POA: Insufficient documentation

## 2016-07-25 DIAGNOSIS — R109 Unspecified abdominal pain: Secondary | ICD-10-CM | POA: Diagnosis present

## 2016-07-25 LAB — URINALYSIS, ROUTINE W REFLEX MICROSCOPIC
Bacteria, UA: NONE SEEN
Bilirubin Urine: NEGATIVE
Glucose, UA: NEGATIVE mg/dL
KETONES UR: NEGATIVE mg/dL
Leukocytes, UA: NEGATIVE
Nitrite: NEGATIVE
PROTEIN: 30 mg/dL — AB
Specific Gravity, Urine: 1.019 (ref 1.005–1.030)
pH: 5 (ref 5.0–8.0)

## 2016-07-25 LAB — COMPREHENSIVE METABOLIC PANEL
ALT: 16 U/L — AB (ref 17–63)
AST: 18 U/L (ref 15–41)
Albumin: 4.3 g/dL (ref 3.5–5.0)
Alkaline Phosphatase: 62 U/L (ref 38–126)
Anion gap: 11 (ref 5–15)
BUN: 10 mg/dL (ref 6–20)
CHLORIDE: 103 mmol/L (ref 101–111)
CO2: 23 mmol/L (ref 22–32)
CREATININE: 0.76 mg/dL (ref 0.61–1.24)
Calcium: 9.1 mg/dL (ref 8.9–10.3)
GFR calc Af Amer: >60 mL/min (ref >60)
Glucose, Bld: 172 mg/dL — ABNORMAL HIGH (ref 65–99)
Potassium: 3.3 mmol/L — ABNORMAL LOW (ref 3.5–5.1)
Sodium: 137 mmol/L (ref 135–145)
Total Bilirubin: 0.7 mg/dL (ref 0.3–1.2)
Total Protein: 7.4 g/dL (ref 6.5–8.1)

## 2016-07-25 LAB — CBC
HCT: 43.4 % (ref 39.0–52.0)
Hemoglobin: 14.4 g/dL (ref 13.0–17.0)
MCH: 28.4 pg (ref 26.0–34.0)
MCHC: 33.2 g/dL (ref 30.0–36.0)
MCV: 85.6 fL (ref 78.0–100.0)
PLATELETS: 304 10*3/uL (ref 150–400)
RBC: 5.07 MIL/uL (ref 4.22–5.81)
RDW: 13.4 % (ref 11.5–15.5)
WBC: 15.9 10*3/uL — ABNORMAL HIGH (ref 4.0–10.5)

## 2016-07-25 LAB — LIPASE, BLOOD: LIPASE: 21 U/L (ref 11–51)

## 2016-07-25 MED ORDER — CEPHALEXIN 500 MG PO CAPS: 500.0000 mg | ORAL_CAPSULE | Freq: Two times a day (BID) | ORAL | 0 refills | Status: DC

## 2016-07-25 MED ORDER — SODIUM CHLORIDE 0.9 % IV BOLUS (SEPSIS)
500.0000 mL | Freq: Once | INTRAVENOUS | Status: AC
  Administered 2016-07-25: 500 mL via INTRAVENOUS

## 2016-07-25 MED ORDER — ONDANSETRON 4 MG PO TBDP: 4.0000 mg | ORAL_TABLET | Freq: Three times a day (TID) | ORAL | 0 refills | Status: DC | PRN

## 2016-07-25 MED ORDER — CEPHALEXIN 500 MG PO CAPS
500.0000 mg | ORAL_CAPSULE | Freq: Once | ORAL | Status: AC
  Administered 2016-07-25: 500 mg via ORAL
  Filled 2016-07-25: qty 1

## 2016-07-25 MED ORDER — ONDANSETRON 4 MG PO TBDP
4.0000 mg | ORAL_TABLET | Freq: Once | ORAL | Status: AC | PRN
  Administered 2016-07-25: 4 mg via ORAL
  Filled 2016-07-25: qty 1

## 2016-07-25 NOTE — ED Notes (Signed)
Pt given Coke for PO challenge.

## 2016-07-25 NOTE — ED Notes (Signed)
Pt attempted to use restroom in triage and was not able to. Will try again.

## 2016-07-25 NOTE — ED Provider Notes (Signed)
[facility]-EMERGENCY DEPT Provider Note   CSN: [id number] Arrival date & time: 07/25/16  1219     History   Chief Complaint Chief Complaint  Patient presents with  . Emesis  . Abdominal Pain    HPI Scott Curtis is a 26 y.o. male.  The history is provided by a parent. No language interpreter was used.  Emesis   Associated symptoms include abdominal pain.  Abdominal Pain   Associated symptoms include vomiting.   Scott Curtis is a 26 y.o. male who presents to the Emergency Department complaining of abdominal pain, vomiting.  Level V caveat due to developmental delay.  History is provided by the patient's mother. She states that he has a history of cough and serous syndrome and is nonverbal at baseline. Today he communicated with her that he was having burning in his stomach and left side. Shortly after the burning he developed severe vomiting. No hematemesis. No fevers, chest pain, dysuria, diarrhea, constipation. Past Medical History:  Diagnosis Date  . Development delay 09/14/2011  . Impaired glucose tolerance 09/24/2012  . Mental retardation 09/14/2011  . Obesity 09/14/2011  . Seizure disorder (HCC) 09/14/2011    Patient Active Problem List   Diagnosis Date Noted  . Rash and nonspecific skin eruption 04/06/2015  . Acute sinus infection 10/26/2014  . Allergic rhinitis 10/26/2014  . Impaired glucose tolerance 09/24/2012  . Acne 09/24/2012  . Allergic conjunctivitis 09/17/2011  . Seizure disorder (HCC) 09/14/2011  . Preventative health care 09/14/2011  . Development delay 09/14/2011  . Mental retardation 09/14/2011  . Obesity 09/14/2011    History reviewed. No pertinent surgical history.     Home Medications    Prior to Admission medications   Medication Sig Start Date End Date Taking? Authorizing Provider  cefdinir (OMNICEF) 300 MG capsule Take 1 capsule (300 mg total) by mouth 2 (two) times daily. Patient not taking: Reported on 07/25/2016 04/06/15   Corwin LevinsJames W John, MD    cephALEXin (KEFLEX) 500 MG capsule Take 1 capsule (500 mg total) by mouth 2 (two) times daily. 07/25/16   Tilden FossaElizabeth Garrit Marrow, MD  clotrimazole-betamethasone (LOTRISONE) cream Use as directed to affected area twice per day as needed Patient not taking: Reported on 07/25/2016 04/06/15   Corwin LevinsJames W John, MD  ketoconazole (NIZORAL) 2 % cream Apply 1 application topically 2 (two) times daily. To ear & finger Patient not taking: Reported on 10/26/2014 01/14/14   Pecola LawlessWilliam F Hopper, MD  levofloxacin (LEVAQUIN) 500 MG tablet Take 1 tablet (500 mg total) by mouth daily. Patient not taking: Reported on 04/06/2015 11/10/14   Corwin LevinsJames W John, MD  Olopatadine HCl (PATADAY) 0.2 % SOLN 1-2 drops each eye twice per day as needed Patient not taking: Reported on 07/25/2016 04/06/15   Corwin LevinsJames W John, MD  ondansetron (ZOFRAN ODT) 4 MG disintegrating tablet Take 1 tablet (4 mg total) by mouth every 8 (eight) hours as needed for nausea or vomiting. 07/25/16   Tilden FossaElizabeth Savonna Birchmeier, MD  predniSONE (DELTASONE) 10 MG tablet 3 tabs by mouth per day for 3 days,2tabs per day for 3 days,1tab per day for 3 days Patient not taking: Reported on 07/25/2016 04/06/15   Corwin LevinsJames W John, MD    Family History Family History  Problem Relation Age of Onset  . Arthritis Other   . Hypertension Other   . Cancer Other     breast cancer    Social History Social History  Substance Use Topics  . Smoking status: Never Smoker  . Smokeless  tobacco: Not on file  . Alcohol use No     Allergies   Codeine   Review of Systems Review of Systems  Gastrointestinal: Positive for abdominal pain and vomiting.  All other systems reviewed and are negative.    Physical Exam Updated Vital Signs BP 111/74   Pulse 77   Resp 16   Ht 5\' 1"  (1.549 m)   Wt 229 lb 8 oz (104.1 kg)   SpO2 100%   BMI 43.36 kg/m   Physical Exam  Constitutional: He appears well-nourished. No distress.  HENT:  Head: Normocephalic and atraumatic.  Neck: Normal range of motion.   Cardiovascular: Normal rate and regular rhythm.   No murmur heard. Pulmonary/Chest: Effort normal and breath sounds normal. No respiratory distress.  Abdominal: Soft.  Mild right-sided abdominal tenderness, moderate left-sided abdominal tenderness no guarding or rebound  Musculoskeletal: He exhibits no edema or tenderness.  Neurological:  Drowsy but arousable to verbal stimuli. Moves all extremities symmetrically.  Skin: Skin is warm and dry. Capillary refill takes less than 2 seconds.  Nursing note and vitals reviewed.    ED Treatments / Results  Labs (all labs ordered are listed, but only abnormal results are displayed) Labs Reviewed  COMPREHENSIVE METABOLIC PANEL - Abnormal; Notable for the following:       Result Value   Potassium 3.3 (*)    Glucose, Bld 172 (*)    ALT 16 (*)    All other components within normal limits  CBC - Abnormal; Notable for the following:    WBC 15.9 (*)    All other components within normal limits  URINALYSIS, ROUTINE W REFLEX MICROSCOPIC - Abnormal; Notable for the following:    APPearance HAZY (*)    Hgb urine dipstick LARGE (*)    Protein, ur 30 (*)    Squamous Epithelial / LPF 0-5 (*)    All other components within normal limits  URINE CULTURE  LIPASE, BLOOD    EKG  EKG Interpretation None       Radiology Ct Renal Stone Study  Result Date: 07/25/2016 CLINICAL DATA:  Left upper quadrant pain EXAM: CT ABDOMEN AND PELVIS WITHOUT CONTRAST TECHNIQUE: Multidetector CT imaging of the abdomen and pelvis was performed following the standard protocol without IV contrast. COMPARISON:  None. FINDINGS: Lower chest: Partially visualized gynecomastia. Lung bases show no acute infiltrate or effusion. Heart size is normal. Hepatobiliary: No focal liver abnormality is seen. No gallstones, gallbladder wall thickening, or biliary dilatation. Pancreas: Unremarkable. No pancreatic ductal dilatation or surrounding inflammatory changes. Spleen: Normal in size  without focal abnormality. Adrenals/Urinary Tract: Adrenal glands are within normal limits. Punctate nonobstructing stone in the upper pole of the left kidney. No hydronephrosis. No ureteral stones. Subtle soft tissue stranding around left kidney hands inferior renal pelvis. Bladder is nearly empty. Superior tenting of the bladder which is contiguous with linear non fluid filled tract extending to the umbilicus, suspicious for urachal remnant, possible vesicourachal diverticulum. Stomach/Bowel: Stomach is nonenlarged. No dilated small bowel. No colon wall thickening. Mild prominent submucosal fatty deposition of colon and distal small bowel, could relate to chronic inflammation. Appendix is noninflamed. Small amount of increased intraluminal density within the appendix. Vascular/Lymphatic: No significant vascular findings are present. No enlarged abdominal or pelvic lymph nodes. Reproductive: Prostate is unremarkable. Other: No free air or free fluid. Small fat filled umbilical hernia. Musculoskeletal: No acute or significant osseous findings. IMPRESSION: 1. Subtle soft tissue stranding/mild perinephric edema raises possibility of pyelonephritis. No hydronephrosis or  obstructing stones. Punctate nonobstructing stone in the left kidney. 2. Tented appearance of the bladder which is contiguous with a linear fibrotic tract extending to the umbilicus raises possibility of your apical remnant/small vesicourachal diverticulum. Electronically Signed   By: Jasmine Pang M.D.   On: 07/25/2016 19:24    Procedures Procedures (including critical care time)  Medications Ordered in ED Medications  ondansetron (ZOFRAN-ODT) disintegrating tablet 4 mg (4 mg Oral Given 07/25/16 1324)  sodium chloride 0.9 % bolus 500 mL (0 mLs Intravenous Stopped 07/25/16 2024)  cephALEXin (KEFLEX) capsule 500 mg (500 mg Oral Given 07/25/16 2023)     Initial Impression / Assessment and Plan / ED Course  I have reviewed the triage vital signs  and the nursing notes.  Pertinent labs & imaging results that were available during my care of the patient were reviewed by me and considered in my medical decision making (see chart for details).  Clinical Course     Patient here for evaluation of severe left flank pain, vomiting. History and examination are significantly limited due to developmental delay. He does have hematuria on UA. CT stone study with perinephric stranding on the left. Question patient had recently passed a stone related to his pain. UA is not consistent with UTI, but with radiographic findings will treat for possible UTI. On repeat assessments in the emergency department his abdominal pain has completely resolved and he is no longer vomiting. Plan to DC home with close outpatient follow up in her symptoms worsens.  Final Clinical Impressions(s) / ED Diagnoses   Final diagnoses:  Left flank pain  Hematuria, unspecified type    New Prescriptions Discharge Medication List as of 07/25/2016  7:53 PM    START taking these medications   Details  ondansetron (ZOFRAN ODT) 4 MG disintegrating tablet Take 1 tablet (4 mg total) by mouth every 8 (eight) hours as needed for nausea or vomiting., Starting Thu 07/25/2016, Print         Tilden Fossa, MD 07/26/16 [id number]

## 2016-07-25 NOTE — ED Notes (Signed)
Pt has urinal at bedside. Has tried several times and cannot urinate. Fluids are now running

## 2016-07-25 NOTE — ED Triage Notes (Signed)
Unable to get temp. Pt would not hold probe under tongue

## 2016-07-25 NOTE — ED Triage Notes (Signed)
Mother reports pt began to have LUQ abd pain today accompanied by dry heaves. Felt well yesterday. No diarrhea or dysuria.

## 2016-07-27 LAB — URINE CULTURE: Culture: NO GROWTH

## 2016-08-21 DIAGNOSIS — H6240 Otitis externa in other diseases classified elsewhere, unspecified ear: Secondary | ICD-10-CM

## 2016-08-21 DIAGNOSIS — B369 Superficial mycosis, unspecified: Secondary | ICD-10-CM | POA: Insufficient documentation

## 2016-08-21 DIAGNOSIS — H6123 Impacted cerumen, bilateral: Secondary | ICD-10-CM | POA: Insufficient documentation

## 2016-09-10 ENCOUNTER — Ambulatory Visit (INDEPENDENT_AMBULATORY_CARE_PROVIDER_SITE_OTHER): Payer: Medicare Other | Admitting: Internal Medicine

## 2016-09-10 ENCOUNTER — Encounter: Payer: Self-pay | Admitting: Internal Medicine

## 2016-09-10 VITALS — BP 160/100 | HR 80 | Temp 99.4°F | Ht 61.0 in | Wt 229.0 lb

## 2016-09-10 DIAGNOSIS — L989 Disorder of the skin and subcutaneous tissue, unspecified: Secondary | ICD-10-CM

## 2016-09-10 DIAGNOSIS — J069 Acute upper respiratory infection, unspecified: Secondary | ICD-10-CM | POA: Diagnosis not present

## 2016-09-10 DIAGNOSIS — R030 Elevated blood-pressure reading, without diagnosis of hypertension: Secondary | ICD-10-CM

## 2016-09-10 MED ORDER — AZITHROMYCIN 250 MG PO TABS: ORAL_TABLET | ORAL | 1 refills | Status: DC

## 2016-09-10 NOTE — Patient Instructions (Signed)
Your flu test was negative  Please take all new medication as prescribed - the antibiotic  You can also take Delsym OTC for cough, and/or Mucinex (or it's generic off brand) for congestion, and tylenol as needed for pain.  You will be contacted regarding the referral for: Dermatology  Please continue all other medications as before, and refills have been done if requested.  Please have the pharmacy call with any other refills you may need.  Please keep your appointments with your specialists as you may have planned

## 2016-09-10 NOTE — Progress Notes (Signed)
Pre visit review using our clinic review tool, if applicable. No additional management support is needed unless otherwise documented below in the visit note. 

## 2016-09-10 NOTE — Progress Notes (Signed)
Subjective:    Patient ID: Scott Curtis, male    DOB: 17-Oct-1990, 26 y.o.   MRN: [id number]  HPI   Here with 2-3 days acute onset fever, facial pain, pressure, headache, general weakness and malaise, and greenish d/c, with mild ST and cough, but pt denies chest pain, wheezing, increased sob or doe, orthopnea, PND, increased LE swelling, palpitations, dizziness or syncope.  Has seen Dr Cloria Spring recently for ear infection.  Has also a scalp nodule getting larger in the past weeks, with slight tender but no pain, drainage or ulcer, no recent trauma Past Medical History:  Diagnosis Date  . Development delay 09/14/2011  . Impaired glucose tolerance 09/24/2012  . Mental retardation 09/14/2011  . Obesity 09/14/2011  . Seizure disorder (HCC) 09/14/2011   No past surgical history on file.  reports that he has never smoked. He has never used smokeless tobacco. He reports that he does not drink alcohol or use drugs. family history includes Arthritis in his other; Cancer in his other; Hypertension in his other. Allergies  Allergen Reactions  . Codeine Hives  . Penicillin G Rash   Current Outpatient Prescriptions on File Prior to Visit  Medication Sig Dispense Refill  . clotrimazole-betamethasone (LOTRISONE) cream Use as directed to affected area twice per day as needed 45 g 1  . ketoconazole (NIZORAL) 2 % cream Apply 1 application topically 2 (two) times daily. To ear & finger 30 g 0  . Olopatadine HCl (PATADAY) 0.2 % SOLN 1-2 drops each eye twice per day as needed 2.5 mL 5  . ondansetron (ZOFRAN ODT) 4 MG disintegrating tablet Take 1 tablet (4 mg total) by mouth every 8 (eight) hours as needed for nausea or vomiting. 10 tablet 0   No current facility-administered medications on file prior to visit.    Review of Systems  Constitutional: Negative for unusual diaphoresis or night sweats HENT: Negative for ear swelling or discharge Eyes: Negative for worsening visual haziness  Respiratory: Negative  for choking and stridor.   Gastrointestinal: Negative for distension or worsening eructation Genitourinary: Negative for retention or change in urine volume.  Musculoskeletal: Negative for other MSK pain or swelling Skin: Negative for color change and worsening wound Neurological: Negative for tremors and numbness other than noted  Psychiatric/Behavioral: Negative for decreased concentration or agitation other than above   All other system neg per pt    Objective:   Physical Exam BP (!) 160/100 (BP Location: Left Arm, Patient Position: Sitting, Cuff Size: Normal)   Pulse 80   Temp 99.4 F (37.4 C) (Oral)   Ht 5\' 1"  (1.549 m)   Wt 229 lb (103.9 kg)   SpO2 98%   BMI 43.27 kg/m  VS noted, mild ill Constitutional: Pt appears in no apparent distress HENT: Head: NCAT.  Right Ear: External ear normal.  Left Ear: External ear normal.  Eyes: . Pupils are equal, round, and reactive to light. Conjunctivae and EOM are normal Bilat tm's with mild erythema.  Max sinus areas mild tender.  Pharynx with mild erythema, no exudate Neck: Normal range of motion. Neck supple.  Cardiovascular: Normal rate and regular rhythm.   Pulmonary/Chest: Effort normal and breath sounds without rales or wheezing.  Neurological: Pt is alert. Not confused , motor grossly intact Skin: Skin is warm. No rash, no LE edema, left crown scalp with 1-1.5 cm area slight tender raised approx 1/2 cm soft nodule without fluctuance or drainage or ulcer Psychiatric: Pt behavior is normal. No agitation.  No other exam findings  Flu test  - negative     Assessment & Plan:

## 2016-09-15 DIAGNOSIS — R030 Elevated blood-pressure reading, without diagnosis of hypertension: Secondary | ICD-10-CM | POA: Insufficient documentation

## 2016-09-15 NOTE — Assessment & Plan Note (Signed)
Mild to mod, for antibx course,  to f/u any worsening symptoms or concerns 

## 2016-09-15 NOTE — Assessment & Plan Note (Signed)
BP Readings from Last 3 Encounters:  09/10/16 (!) 160/100  07/25/16 111/74  04/06/15 122/78   Reading most likely reactive, hold on specific tx except that as above, cont to monitor at home and next visit

## 2016-09-15 NOTE — Assessment & Plan Note (Signed)
Mild, likely benign, for derm referral,  to f/u any worsening symptoms or concerns

## 2016-11-21 DIAGNOSIS — L280 Lichen simplex chronicus: Secondary | ICD-10-CM | POA: Insufficient documentation

## 2016-11-21 DIAGNOSIS — L219 Seborrheic dermatitis, unspecified: Secondary | ICD-10-CM | POA: Insufficient documentation

## 2016-12-11 ENCOUNTER — Encounter: Payer: Self-pay | Admitting: Neurology

## 2016-12-11 ENCOUNTER — Ambulatory Visit (INDEPENDENT_AMBULATORY_CARE_PROVIDER_SITE_OTHER): Payer: Medicare Other | Admitting: Neurology

## 2016-12-11 VITALS — BP 101/66 | HR 76 | Ht 61.0 in | Wt 223.0 lb

## 2016-12-11 DIAGNOSIS — R51 Headache: Secondary | ICD-10-CM

## 2016-12-11 DIAGNOSIS — R238 Other skin changes: Secondary | ICD-10-CM

## 2016-12-11 DIAGNOSIS — R519 Headache, unspecified: Secondary | ICD-10-CM

## 2016-12-11 DIAGNOSIS — G8929 Other chronic pain: Secondary | ICD-10-CM

## 2016-12-11 NOTE — Patient Instructions (Signed)
Remember to drink plenty of fluid, eat healthy meals and do not skip any meals. Try to eat protein with a every meal and eat a healthy snack such as fruit or nuts in between meals. Try to keep a regular sleep-wake schedule and try to exercise daily, particularly in the form of walking, 20-30 minutes a day, if you can.   As far as diagnostic testing: CT of the head  I would like to see you back as needed, sooner if we need to. Please call us with any interim questions, concerns, problems, updates or refill requests.   Our phone number is [phone]. We also have an after hours call service for urgent matters and there is a physician on-call for urgent questions. For any emergencies you know to call 911 or go to the nearest emergency room

## 2016-12-11 NOTE — Progress Notes (Signed)
[facility]    Provider:  Dr Lucia Gaskins Referring Provider: Doreen Beam, MD Primary Care Physician:  Corwin Levins, MD  CC:  Localized neuropathy in the parietal scalp  HPI:  Scott Curtis is a 26 y.o. male here as a referral from Dr. Jonny Ruiz for evaluation of localized neuropathy on the parietal scalp. He has a past medical history of Coffin Siris syndrome, mild mental retardation, seizures, obesity. He was previously followed by Dr. Sharene Skeans.  Mother is here and provides all information. Patient is non verbal but cannot do questions and follow simple commands. This has been ongoing for 25 years. Sensitivity of the left parietal scalp. Only when he gets his hair cut he can;t stand it. No headaches. He touches it. He points to the entire top of the head. He is in no acute distress. Only when cutting his hear. No other issues with wearing hats or headaches or sleeping on it. Something about the part on the scalp.  He denies numbness, tingling, burning it just hurts. Getting worse. More painful. Just hurts when someone touches not continuous. No headache. No vision changes. No pain anywhere else. Just the left side of the head. No other focal neurologic deficits, associated symptoms, inciting events or modifiable factors.  Reviewed notes, labs and imaging from outside physicians, which showed:   He's had a lesion on the scalp years, is a bump in pain, recommend bumpy, getting more sensitive. Reviewed notes from [facility]. He has no significant skin history. They put him on triamcinolone topical cream. Located on the right inferior lateral anterior scalp. No clinical findings. Had chickenpox vaccine as a child and has never had shingles to explain this pain. Appears to be localized neuropathy for some reason. Dry, scaly, mildly erythematous plaques located on his left inferior mid concha diagnosis seborrheic dermatitis. Apply triamcinolone. He also has erythematous,  scaly, lichenified plaques located in his right mid medial knee, left inferior central knee.  Reviewed XR report of the skull report from the year 2000:FINDINGS CLINICAL:    HEAD VERSUS POLE. NO EVIDENCE OF DISPLACED OR DEPRESSED FRACTURE OR OTHER BONE ABNORMALITY. IMPRESSION NEGATIVE.  Reviewed labs from January CBC showed elevated white blood cells otherwise normal, CMP was largely unremarkable except for some minimally decreased potassium and elevated glucose  Review of Systems: Patient complains of symptoms per HPI as well as the following symptoms: No rash, no lesions. Pertinent negatives per HPI. All others negative.   Social History   Social History  . Marital status: Single    Spouse name: N/A  . Number of children: N/A  . Years of education: N/A   Occupational History  . N/A    Social History Main Topics  . Smoking status: Never Smoker  . Smokeless tobacco: Never Used  . Alcohol use No  . Drug use: No  . Sexual activity: Not on file   Other Topics Concern  . Not on file   Social History Narrative   Lives at home w/ his mom   Caffeine: occasional tea or soda    Family History  Problem Relation Age of Onset  . Arthritis Other   . Hypertension Other   . Cancer Other        breast cancer  . Neuropathy Neg Hx     Past Medical History:  Diagnosis Date  . Development delay 09/14/2011  . Impaired glucose tolerance 09/24/2012  . Mental retardation 09/14/2011  . Obesity 09/14/2011  . Seizure disorder (HCC) 09/14/2011  Past Surgical History:  Procedure Laterality Date  . NO PAST SURGERIES      Current Outpatient Prescriptions  Medication Sig Dispense Refill  . clotrimazole-betamethasone (LOTRISONE) cream Use as directed to affected area twice per day as needed 45 g 1  . ketoconazole (NIZORAL) 2 % cream Apply 1 application topically 2 (two) times daily. To ear & finger 30 g 0  . Olopatadine HCl (PATADAY) 0.2 % SOLN 1-2 drops each eye twice per day as  needed 2.5 mL 5  . ondansetron (ZOFRAN ODT) 4 MG disintegrating tablet Take 1 tablet (4 mg total) by mouth every 8 (eight) hours as needed for nausea or vomiting. 10 tablet 0   No current facility-administered medications for this visit.     Allergies as of 12/11/2016 - Review Complete 12/11/2016  Allergen Reaction Noted  . Codeine Hives 09/14/2011  . Penicillin g Rash 08/21/2016    Vitals: BP 101/66   Pulse 76   Ht 5\' 1"  (1.549 m)   Wt 223 lb (101.2 kg)   BMI 42.14 kg/m  Last Weight:  Wt Readings from Last 1 Encounters:  12/11/16 223 lb (101.2 kg)   Last Height:   Ht Readings from Last 1 Encounters:  12/11/16 5\' 1"  (1.549 m)    caould not test equal and antigravity Large round face Not ataxic wide based due to  Cannot speak but can answer questions by nodding and follow simple comands   Physical exam: Exam: Gen: NAD, patient is averbal and obese, coarse facial features            CV: RRR, no MRG. No Carotid Bruits. No peripheral edema, warm, nontender Eyes: Conjunctivae clear without exudates or hemorrhage  Neuro: Detailed Neurologic Exam  Speech:    Patient is nonverbal Cognition:    The patient is oriented to person.    recent and remote memory impaired;     language nonverbal but can follow simple commands and nod yes or no to simple questions;     Impaired attention, concentration, fund of knowledge Cranial Nerves:    The pupils are equal, round, and reactive to light. Attempted funduscopic exam could not visualize due to patient noncooperation. Blink to threat in all quadrants. Extraocular movements are intact. Trigeminal sensation is intact and the muscles of mastication are normal. The face is symmetric. The palate elevates in the midline. Hearing intact. Voice is severely dysarthric and he is largely nonverbal. Shoulder shrug is normal. The tongue has normal motion without fasciculations.   Coordination:    Mild dysmetria dysmetria and impaired fine  motor  Gait:    Slightly wide-based, waddling  Motor Observation:     no involuntary movements noted. Tone:    Normal muscle tone.    Posture:    Posture is normal. normal erect    Strength:    Strength is V/V in the upper and lower limbs.      Sensation: intact to LT     Reflex Exam:  DTR's:    Deep tendon reflexes in the upper and lower extremities are symmetrical bilaterally.   Toes:    The toes are equivocal bilaterally.   Clonus:    Clonus is absent.   Assessment/Plan:   26 y.o. male here as a referral from Dr. Jonny Ruiz for evaluation of localized neuropathy on the parietal scalp. He has a past medical history of Coffin Siris syndrome, mild mental retardation, seizures, obesity.  He has had a left scalp sensitivity as a young child,  not sensitive to touch, patient only reports that it makes him uncomfortable to have his haircut on that side. She's been to dermatology. I don't see any lesions or rashes. Nontender to palpation. Unclear etiology. Order a CT of the head to rule out underlying lesions but otherwise we could try some topical numbing agent when he gets his haircut, mother declined she just wants to make sure everything is fine with him.  Orders Placed This Encounter  Procedures  . CT HEAD WO CONTRAST   Cc: Dr. Doreen BeamWhitworth and Dr. Brynda PeonJohn Mahlik Lenn, MD  [facility] [address] [place], [place] [id number]  Phone [phone] Fax [phone]

## 2016-12-12 ENCOUNTER — Encounter: Payer: Self-pay | Admitting: Neurology

## 2016-12-20 ENCOUNTER — Ambulatory Visit
Admission: RE | Admit: 2016-12-20 | Discharge: 2016-12-20 | Disposition: A | Discharge status: Home / Self Care | Payer: Medicare Other | Source: Ambulatory Visit | Attending: Neurology | Admitting: Neurology

## 2016-12-20 DIAGNOSIS — R238 Other skin changes: Secondary | ICD-10-CM

## 2016-12-20 DIAGNOSIS — R51 Headache: Secondary | ICD-10-CM | POA: Diagnosis not present

## 2016-12-20 DIAGNOSIS — R519 Headache, unspecified: Secondary | ICD-10-CM

## 2016-12-20 DIAGNOSIS — G8929 Other chronic pain: Secondary | ICD-10-CM

## 2016-12-31 ENCOUNTER — Telehealth: Payer: Self-pay | Admitting: Internal Medicine

## 2016-12-31 ENCOUNTER — Telehealth: Payer: Self-pay | Admitting: *Deleted

## 2016-12-31 NOTE — Telephone Encounter (Signed)
Called and LVM for pt about normal CT head per [facility],MD note. Gave [facility] phone number if they have further questions or concerns.

## 2016-12-31 NOTE — Telephone Encounter (Signed)
Pt mother called and states her son has been very hyper lately and would like something prescribed to calm him down and take the edge off.  Please advise. Would you like them to come in for an appointment? Thanks

## 2016-12-31 NOTE — Telephone Encounter (Signed)
-----   Message from Anson FretAntonia B Ahern, MD sent at 12/22/2016  6:30 PM EDT ----- CT of the head is normal thanks

## 2016-12-31 NOTE — Telephone Encounter (Signed)
Please schedule an appt

## 2017-08-11 ENCOUNTER — Encounter (HOSPITAL_COMMUNITY): Payer: Self-pay | Admitting: Nurse Practitioner

## 2017-08-11 ENCOUNTER — Other Ambulatory Visit: Payer: Self-pay

## 2017-08-11 ENCOUNTER — Emergency Department (HOSPITAL_COMMUNITY)
Admission: EM | Admit: 2017-08-11 | Discharge: 2017-08-12 | Disposition: A | Discharge status: Home / Self Care | Payer: Medicare Other | Attending: Emergency Medicine | Admitting: Emergency Medicine

## 2017-08-11 DIAGNOSIS — Z79899 Other long term (current) drug therapy: Secondary | ICD-10-CM | POA: Diagnosis not present

## 2017-08-11 DIAGNOSIS — F71 Moderate intellectual disabilities: Secondary | ICD-10-CM | POA: Diagnosis not present

## 2017-08-11 DIAGNOSIS — F3481 Disruptive mood dysregulation disorder: Secondary | ICD-10-CM | POA: Diagnosis not present

## 2017-08-11 DIAGNOSIS — G3184 Mild cognitive impairment, so stated: Secondary | ICD-10-CM | POA: Insufficient documentation

## 2017-08-11 DIAGNOSIS — R17 Unspecified jaundice: Secondary | ICD-10-CM | POA: Insufficient documentation

## 2017-08-11 DIAGNOSIS — F79 Unspecified intellectual disabilities: Secondary | ICD-10-CM | POA: Diagnosis not present

## 2017-08-11 DIAGNOSIS — R4689 Other symptoms and signs involving appearance and behavior: Secondary | ICD-10-CM | POA: Diagnosis not present

## 2017-08-11 DIAGNOSIS — R6250 Unspecified lack of expected normal physiological development in childhood: Secondary | ICD-10-CM | POA: Diagnosis not present

## 2017-08-11 LAB — CBC WITH DIFFERENTIAL/PLATELET
Basophils Absolute: 0 10*3/uL (ref 0.0–0.1)
Basophils Relative: 0 %
EOS ABS: 0 10*3/uL (ref 0.0–0.7)
Eosinophils Relative: 1 %
HCT: 41.7 % (ref 39.0–52.0)
HEMOGLOBIN: 13.9 g/dL (ref 13.0–17.0)
LYMPHS PCT: 20 %
Lymphs Abs: 1.4 10*3/uL (ref 0.7–4.0)
MCH: 29.2 pg (ref 26.0–34.0)
MCHC: 33.3 g/dL (ref 30.0–36.0)
MCV: 87.6 fL (ref 78.0–100.0)
Monocytes Absolute: 0.5 10*3/uL (ref 0.1–1.0)
Monocytes Relative: 7 %
NEUTROS PCT: 72 %
Neutro Abs: 4.8 10*3/uL (ref 1.7–7.7)
Platelets: 241 10*3/uL (ref 150–400)
RBC: 4.76 MIL/uL (ref 4.22–5.81)
RDW: 13.4 % (ref 11.5–15.5)
WBC: 6.7 10*3/uL (ref 4.0–10.5)

## 2017-08-11 LAB — COMPREHENSIVE METABOLIC PANEL
ALBUMIN: 4.1 g/dL (ref 3.5–5.0)
ALK PHOS: 61 U/L (ref 38–126)
ALT: 17 U/L (ref 17–63)
ANION GAP: 8 (ref 5–15)
AST: 23 U/L (ref 15–41)
BUN: 9 mg/dL (ref 6–20)
CALCIUM: 9.3 mg/dL (ref 8.9–10.3)
CO2: 27 mmol/L (ref 22–32)
CREATININE: 0.66 mg/dL (ref 0.61–1.24)
Chloride: 103 mmol/L (ref 101–111)
GFR calc Af Amer: >60 mL/min (ref >60)
GFR calc non Af Amer: >60 mL/min (ref >60)
Glucose, Bld: 95 mg/dL (ref 65–99)
Potassium: 3.3 mmol/L — ABNORMAL LOW (ref 3.5–5.1)
SODIUM: 138 mmol/L (ref 135–145)
Total Bilirubin: 2 mg/dL — ABNORMAL HIGH (ref 0.3–1.2)
Total Protein: 7.4 g/dL (ref 6.5–8.1)

## 2017-08-11 LAB — ETHANOL: Alcohol, Ethyl (B): <10 mg/dL (ref <10)

## 2017-08-11 LAB — SALICYLATE LEVEL: Salicylate Lvl: <7 mg/dL (ref 2.8–30.0)

## 2017-08-11 LAB — ACETAMINOPHEN LEVEL

## 2017-08-11 MED ORDER — LORAZEPAM 2 MG/ML IJ SOLN
1.0000 mg | Freq: Once | INTRAMUSCULAR | Status: AC
  Administered 2017-08-11: 1 mg via INTRAMUSCULAR
  Filled 2017-08-11: qty 1

## 2017-08-11 MED ORDER — ZIPRASIDONE MESYLATE 20 MG IM SOLR
20.0000 mg | Freq: Once | INTRAMUSCULAR | Status: AC
  Administered 2017-08-11: 20 mg via INTRAMUSCULAR
  Filled 2017-08-11: qty 20

## 2017-08-11 MED ORDER — DIPHENHYDRAMINE HCL 50 MG/ML IJ SOLN
50.0000 mg | Freq: Once | INTRAMUSCULAR | Status: AC
  Administered 2017-08-11: 50 mg via INTRAMUSCULAR
  Filled 2017-08-11: qty 1

## 2017-08-11 MED ORDER — STERILE WATER FOR INJECTION IJ SOLN
INTRAMUSCULAR | Status: AC
  Administered 2017-08-11: 1.2 mL
  Filled 2017-08-11: qty 10

## 2017-08-11 MED ORDER — ZIPRASIDONE MESYLATE 20 MG IM SOLR: 10.0000 mg | Freq: Once | INTRAMUSCULAR | Status: DC

## 2017-08-11 NOTE — ED Notes (Signed)
Patient has been physically aggressive. Mostly towards himself. He has been slapping and punching himself in the face. He has been biting himself to the point where he draws blood. MD was made aware and medicated per MAR.
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
## 2017-08-12 ENCOUNTER — Telehealth: Payer: Self-pay | Admitting: Internal Medicine

## 2017-08-12 DIAGNOSIS — Z79899 Other long term (current) drug therapy: Secondary | ICD-10-CM | POA: Diagnosis not present

## 2017-08-12 DIAGNOSIS — R6250 Unspecified lack of expected normal physiological development in childhood: Secondary | ICD-10-CM

## 2017-08-12 DIAGNOSIS — R4689 Other symptoms and signs involving appearance and behavior: Secondary | ICD-10-CM

## 2017-08-12 DIAGNOSIS — F71 Moderate intellectual disabilities: Secondary | ICD-10-CM | POA: Diagnosis not present

## 2017-08-12 MED ORDER — POTASSIUM CHLORIDE CRYS ER 20 MEQ PO TBCR: 40.0000 meq | EXTENDED_RELEASE_TABLET | Freq: Once | ORAL | Status: DC

## 2017-08-12 NOTE — Telephone Encounter (Signed)
Very sorry, I dont feel comfortable with this as I have not examined him, and I would think the d/c physicians should be aware of medication that he would need at d/c

## 2017-08-12 NOTE — ED Notes (Signed)
Mother-[phone]

## 2017-08-12 NOTE — Telephone Encounter (Signed)
Called pt's mother, [name], LVM on her cell phone.

## 2017-08-12 NOTE — BH Assessment (Signed)
[facility]  Per Juanetta BeetsJacqueline Norman, DO, this pt does not require psychiatric hospitalization at this time.  Pt presents under IVC initiated by pt's mother/guardian, which Dr Sharma CovertNorman has rescinded.  [facility].  This Clinical research associatewriter also called the [facility] and ascertained that pt has an IDD care coordinator by the name of Scott Curtis [phone]).  Discharge instructions include contact information for both of these resources.  At 10:39 this Clinical research associatewriter called pt's mother, Scott Curtis [phone]), reporting pt's disposition.  [facility] up.  Pt's nurse, [name], has been notified.  Scott Canninghomas Zafir Schauer, MA Triage Specialist [phone]

## 2017-08-12 NOTE — Telephone Encounter (Signed)
Copied from CRM #[id number]. Topic: Quick Communication - See Telephone Encounter >> Aug 12, 2017 10:48 AM Diana EvesHoyt, Maryann B wrote: CRM for notification. See Telephone encounter for:  Pt's mother calling in requesting a xanax be called in for son. He is being discharged from the hospital today and he has a follow up appt on 08/19/17. She is requesting it from his behavior she is stating all the notes from his hospital records will indicate why he needs it.  08/12/17.

## 2017-08-12 NOTE — BHH Suicide Risk Assessment (Signed)
Suicide Risk Assessment  Discharge Assessment   [facility]   Principal Problem: Development delay Discharge Diagnoses:  Patient Active Problem List   Diagnosis Date Noted  . Elevated blood-pressure reading without diagnosis of hypertension [R03.0] 09/15/2016  . Scalp lesion [L98.9] 09/10/2016  . Acute upper respiratory infection [J06.9] 09/10/2016  . Rash and nonspecific skin eruption [R21] 04/06/2015  . Allergic rhinitis [J30.9] 10/26/2014  . Impaired glucose tolerance [R73.02] 09/24/2012  . Acne [L70.9] 09/24/2012  . Allergic conjunctivitis [H10.10] 09/17/2011  . Seizure disorder (HCC) [G40.909] 09/14/2011  . Preventative health care [Z00.00] 09/14/2011  . Development delay [R62.50] 09/14/2011  . Mental retardation [F79] 09/14/2011  . Obesity [E66.9] 09/14/2011    Total Time spent with patient: 45 minutes  Musculoskeletal: Strength & Muscle Tone: within normal limits Gait & Station: normal Patient leans: N/A  Psychiatric Specialty Exam: Physical Exam  Constitutional: He appears well-developed and well-nourished.  HENT:  Head: Normocephalic.  Respiratory: Effort normal.  Musculoskeletal: Normal range of motion.  Neurological: He is alert.  Psychiatric: His speech is normal. Thought content normal. His mood appears anxious. He is agitated. Cognition and memory are impaired. He expresses impulsivity.   Review of Systems  Psychiatric/Behavioral: Positive for depression and memory loss (moderate IDD). Negative for hallucinations, substance abuse and suicidal ideas. The patient is nervous/anxious. The patient does not have insomnia.   All other systems reviewed and are negative.  Blood pressure 113/71, pulse 87, temperature 98.2 F (36.8 C), temperature source Oral, resp. rate 16, height 5' (1.524 m), weight 101.2 kg (223 lb), SpO2 100 %.Body mass index is 43.55 kg/m. General Appearance: Casual Eye Contact:  Fair Speech:  non verbal Volume:   non verbal Mood:  Depressed and Irritable Affect:  Congruent Thought Process:  NA Orientation:  Other:  person Thought Content:  UTA pt is non verbal and moderate IDD Suicidal Thoughts:  No Homicidal Thoughts:  No Memory:  Immediate;   UTA pt is non verbal and moderate IDD Recent;   UTA pt is non verbal and moderate IDD Remote;   UTA pt is non verbal and moderate IDD Judgement:  Poor Insight:  Shallow Psychomotor Activity:  Increased Concentration:  Concentration: Fair and Attention Span: Fair Recall:  UTA pt is non verbal and moderate IDD Fund of Knowledge:  Poor Language:  UTA pt is non verbal and moderate IDD Akathisia:  No Handed:  Right AIMS (if indicated):    Assets:  Financial Resources/Insurance Housing ADL's:  Intact Cognition:  WNL   Mental Status Per Nursing Assessment::   On Admission:   aggressive and agitated  Demographic Factors:  Male, Adolescent or young adult and moderte IDD  Loss Factors: NA  Historical Factors: Impulsivity  Risk Reduction Factors:   Living with another person, especially a relative  Continued Clinical Symptoms:  Severe Anxiety and/or Agitation Depression:   Impulsivity  Cognitive Features That Contribute To Risk:  Loss of executive function    Suicide Risk:  Minimal: No identifiable suicidal ideation.  Patients presenting with no risk factors but with morbid ruminations; may be classified as minimal risk based on the severity of the depressive symptoms    Plan Of Care/Follow-up recommendations:  Activity:  as tolerated Diet:  Heart healthy  Laveda AbbeLaurie Britton Glorimar Stroope, NP 08/12/2017, 10:57 AM

## 2017-08-12 NOTE — Consult Note (Signed)
[facility] Consult   Reason for Consult:  Aggressive behavior Referring Physician:  EDP Patient Identification: Scott Curtis MRN:  [id number] Principal Diagnosis: Development delay Diagnosis:   Patient Active Problem List   Diagnosis Date Noted  . Elevated blood-pressure reading without diagnosis of hypertension [R03.0] 09/15/2016  . Scalp lesion [L98.9] 09/10/2016  . Acute upper respiratory infection [J06.9] 09/10/2016  . Rash and nonspecific skin eruption [R21] 04/06/2015  . Allergic rhinitis [J30.9] 10/26/2014  . Impaired glucose tolerance [R73.02] 09/24/2012  . Acne [L70.9] 09/24/2012  . Allergic conjunctivitis [H10.10] 09/17/2011  . Seizure disorder ([place]) [S49.675] 09/14/2011  . Preventative health care [Z00.00] 09/14/2011  . Development delay [R62.50] 09/14/2011  . Mental retardation [F79] 09/14/2011  . Obesity [E66.9] 09/14/2011    Total Time spent with patient: 45 minutes  Subjective:   Scott Curtis is a 27 y.o. male patient admitted with aggressive behavior.  HPI:  Pt was seen and chart reviewed with treatment team and Dr Mariea Clonts. Pt is non-verbal and moderate IDD. Pt communicates by shaking his head yes and no and hitting himself in the chest with his hand. Pt is focused on having a cell phone to play games. Pt has become more aggressive at home. Pt's legal guardian is his mother and she placed him under IVC due to being overwhelmed with his care. Pt has an appointment tomorrow with his PCP for medication management.  Pt denies suicidal/homicidal ideation, denies auditory/visual hallucinations and does not appear to be responding to internal stimuli. Pt's mother is aware of the plan to discharge patient today and is in agreement. Pt will be given outpatient resources for psychiatry and Pt has been assigned a care coordinator with [facility]. Pt is psychiatrically clear for discharge.   Past Psychiatric History: As tolerated  Risk to Self: None Risk to  Others: None Prior Inpatient Therapy: Prior Inpatient Therapy: No Prior Therapy Dates: None Prior Therapy Facilty/Provider(s): None Reason for Treatment: None Prior Outpatient Therapy: Prior Outpatient Therapy: No Prior Therapy Dates: None Prior Therapy Facilty/Provider(s): None Reason for Treatment: N/A Does patient have an ACCT team?: No Does patient have Intensive In-House Services?  : No Does patient have [facility] services? : No Does patient have P4CC services?: No  Past Medical History:  Past Medical History:  Diagnosis Date  . Development delay 09/14/2011  . Impaired glucose tolerance 09/24/2012  . Mental retardation 09/14/2011  . Obesity 09/14/2011  . Seizure disorder ([facility] [place]) 09/14/2011    Past Surgical History:  Procedure Laterality Date  . NO PAST SURGERIES     Family History:  Family History  Problem Relation Age of Onset  . Arthritis Other   . Hypertension Other   . Cancer Other        breast cancer  . Neuropathy Neg Hx    Family Psychiatric  History: Unknown Social History:  Social History   Substance and Sexual Activity  Alcohol Use No     Social History   Substance and Sexual Activity  Drug Use No    Social History   Socioeconomic History  . Marital status: Single    Spouse name: None  . Number of children: None  . Years of education: None  . Highest education level: None  Social Needs  . Financial resource strain: None  . Food insecurity - worry: None  . Food insecurity - inability: None  . Transportation needs - medical: None  . Transportation needs - non-medical: None  Occupational History  . Occupation: N/A  Tobacco Use  . Smoking status: Never Smoker  . Smokeless tobacco: Never Used  Substance and Sexual Activity  . Alcohol use: No  . Drug use: No  . Sexual activity: None  Other Topics Concern  . None  Social History Narrative   Lives at home w/ his mom   Caffeine: occasional tea or soda   Additional Social History: N/A     Allergies:   Allergies  Allergen Reactions  . Codeine Hives  . Penicillin G Rash    Has patient had a PCN reaction causing immediate rash, facial/tongue/throat swelling, SOB or lightheadedness with hypotension: Unknown Has patient had a PCN reaction causing severe rash involving mucus membranes or skin necrosis: No Has patient had a PCN reaction that required hospitalization: No Has patient had a PCN reaction occurring within the last 10 years: No If all of the above answers are "NO", then may proceed with Cephalosporin use.     Labs:  Results for orders placed or performed during the hospital encounter of 08/11/17 (from the past 48 hour(s))  Comprehensive metabolic panel     Status: Abnormal   Collection Time: 08/11/17  8:22 PM  Result Value Ref Range   Sodium 138 135 - 145 mmol/L   Potassium 3.3 (L) 3.5 - 5.1 mmol/L   Chloride 103 101 - 111 mmol/L   CO2 27 22 - 32 mmol/L   Glucose, Bld 95 65 - 99 mg/dL   BUN 9 6 - 20 mg/dL   Creatinine, Ser 0.66 0.61 - 1.24 mg/dL   Calcium 9.3 8.9 - 10.3 mg/dL   Total Protein 7.4 6.5 - 8.1 g/dL   Albumin 4.1 3.5 - 5.0 g/dL   AST 23 15 - 41 U/L   ALT 17 17 - 63 U/L   Alkaline Phosphatase 61 38 - 126 U/L   Total Bilirubin 2.0 (H) 0.3 - 1.2 mg/dL   GFR calc non Af Amer >60 >60 mL/min   GFR calc Af Amer >60 >60 mL/min    Comment: (NOTE) The eGFR has been calculated using the CKD EPI equation. This calculation has not been validated in all clinical situations. eGFR's persistently <60 mL/min signify possible Chronic Kidney Disease.    Anion gap 8 5 - 15  Ethanol     Status: None   Collection Time: 08/11/17  8:22 PM  Result Value Ref Range   Alcohol, Ethyl (B) <10 <10 mg/dL    Comment:        LOWEST DETECTABLE LIMIT FOR SERUM ALCOHOL IS 10 mg/dL FOR MEDICAL PURPOSES ONLY   CBC with Diff     Status: None   Collection Time: 08/11/17  8:22 PM  Result Value Ref Range   WBC 6.7 4.0 - 10.5 K/uL   RBC 4.76 4.22 - 5.81 MIL/uL    Hemoglobin 13.9 13.0 - 17.0 g/dL   HCT 41.7 39.0 - 52.0 %   MCV 87.6 78.0 - 100.0 fL   MCH 29.2 26.0 - 34.0 pg   MCHC 33.3 30.0 - 36.0 g/dL   RDW 13.4 11.5 - 15.5 %   Platelets 241 150 - 400 K/uL   Neutrophils Relative % 72 %   Neutro Abs 4.8 1.7 - 7.7 K/uL   Lymphocytes Relative 20 %   Lymphs Abs 1.4 0.7 - 4.0 K/uL   Monocytes Relative 7 %   Monocytes Absolute 0.5 0.1 - 1.0 K/uL   Eosinophils Relative 1 %   Eosinophils Absolute 0.0 0.0 - 0.7 K/uL   Basophils  Relative 0 %   Basophils Absolute 0.0 0.0 - 0.1 K/uL  Acetaminophen level     Status: Abnormal   Collection Time: 08/11/17  8:22 PM  Result Value Ref Range   Acetaminophen (Tylenol), Serum <10 (L) 10 - 30 ug/mL    Comment:        THERAPEUTIC CONCENTRATIONS VARY SIGNIFICANTLY. A RANGE OF 10-30 ug/mL MAY BE AN EFFECTIVE CONCENTRATION FOR MANY PATIENTS. HOWEVER, SOME ARE BEST TREATED AT CONCENTRATIONS OUTSIDE THIS RANGE. ACETAMINOPHEN CONCENTRATIONS >150 ug/mL AT 4 HOURS AFTER INGESTION AND >50 ug/mL AT 12 HOURS AFTER INGESTION ARE OFTEN ASSOCIATED WITH TOXIC REACTIONS.   Salicylate level     Status: None   Collection Time: 08/11/17  8:22 PM  Result Value Ref Range   Salicylate Lvl <7.3 2.8 - 30.0 mg/dL    Current Facility-Administered Medications  Medication Dose Route Frequency Provider Last Rate Last Dose  . potassium chloride SA (K-DUR,KLOR-CON) CR tablet 40 mEq  40 mEq Oral Once Duffy Bruce, MD   Stopped at 08/12/17 0226   Current Outpatient Medications  Medication Sig Dispense Refill  . clotrimazole-betamethasone (LOTRISONE) cream Use as directed to affected area twice per day as needed (Patient not taking: Reported on 08/11/2017) 45 g 1  . ketoconazole (NIZORAL) 2 % cream Apply 1 application topically 2 (two) times daily. To ear & finger (Patient not taking: Reported on 08/11/2017) 30 g 0  . Olopatadine HCl (PATADAY) 0.2 % SOLN 1-2 drops each eye twice per day as needed (Patient not taking: Reported on  08/11/2017) 2.5 mL 5  . ondansetron (ZOFRAN ODT) 4 MG disintegrating tablet Take 1 tablet (4 mg total) by mouth every 8 (eight) hours as needed for nausea or vomiting. (Patient not taking: Reported on 08/11/2017) 10 tablet 0    Musculoskeletal: Strength & Muscle Tone: within normal limits Gait & Station: normal Patient leans: N/A  Psychiatric Specialty Exam: Physical Exam  Nursing note and vitals reviewed. Constitutional: He appears well-developed and well-nourished.  HENT:  Head: Normocephalic.  Neck: Normal range of motion.  Respiratory: Effort normal.  Musculoskeletal: Normal range of motion.  Neurological: He is alert.  Psychiatric: His speech is normal. Thought content normal. His mood appears anxious. He is agitated. Cognition and memory are impaired. He expresses impulsivity.    Review of Systems  Psychiatric/Behavioral: Positive for depression and memory loss (moderate IDD). Negative for hallucinations, substance abuse and suicidal ideas. The patient is nervous/anxious. The patient does not have insomnia.   All other systems reviewed and are negative.   Blood pressure 113/71, pulse 87, temperature 98.2 F (36.8 C), temperature source Oral, resp. rate 16, height 5' (1.524 m), weight 101.2 kg (223 lb), SpO2 100 %.Body mass index is 43.55 kg/m.  General Appearance: Casual  Eye Contact:  Fair  Speech:  non verbal  Volume:  non verbal  Mood:  UTA since patient is nonverbal.  Affect:  Anxious and mildly irritable.  Thought Process:  NA  Orientation:  Other:  person  Thought Content:  UTA pt is non verbal and moderate IDD  Suicidal Thoughts:  No  Homicidal Thoughts:  No  Memory:  Immediate;   UTA pt is non verbal and moderate IDD Recent;   UTA pt is non verbal and moderate IDD Remote;   UTA pt is non verbal and moderate IDD  Judgement:  Poor  Insight:  Shallow  Psychomotor Activity:  Increased  Concentration:  Concentration: Fair and Attention Span: Fair  Recall:  UTA pt  is  non verbal and moderate IDD  Fund of Knowledge:  Poor  Language:  UTA pt is non verbal and moderate IDD  Akathisia:  No  Handed:  Right  AIMS (if indicated):    N/A  Assets:  Financial Resources/Insurance Housing  ADL's:  Intact  Cognition:  Impaired secondary to IDD.   Sleep:    N/A     Treatment Plan Summary: Plan development delay with aggressive behavior  Discharge Home Follow up with PCP for medication management Follow up with outpatient psychiatry resources provided Take all medications as prescribed  Disposition: No evidence of imminent risk to self or others at present.   Patient does not meet criteria for psychiatric inpatient admission. Supportive therapy provided about ongoing stressors. Discussed crisis plan, support from social network, calling 911, coming to the Emergency Department, and calling Suicide Hotline.  Ethelene Hal, NP 08/12/2017 10:55 AM   Patient seen face-to-face for psychiatric evaluation, chart reviewed and case discussed with the physician extender and developed treatment plan. Reviewed the information documented and agree with the treatment plan.  Buford Dresser, DO

## 2017-08-12 NOTE — Telephone Encounter (Signed)
I have scheduled patient appt

## 2017-08-12 NOTE — Discharge Instructions (Signed)
For your behavioral health needs, you are advised to follow up with an outpatient psychiatrist.  Contact the [facility] at your earliest opportunity to ask about scheduling an intake appointment:       [facility]      [address]      [place], [place] [address]      [phone]  For additional supportive services, contact your [facility] care coordinator, Asher MuirJamie Buccola:       Osvaldo HumanJamie Buccola, Care Coordinator      [facility]      [phone]

## 2017-08-13 ENCOUNTER — Encounter: Payer: Medicare Other | Admitting: Internal Medicine

## 2017-08-13 ENCOUNTER — Encounter: Payer: Self-pay | Admitting: Internal Medicine

## 2017-08-13 ENCOUNTER — Ambulatory Visit (INDEPENDENT_AMBULATORY_CARE_PROVIDER_SITE_OTHER): Payer: Medicare Other | Admitting: Internal Medicine

## 2017-08-13 VITALS — BP 128/86 | HR 73 | Ht 60.0 in | Wt 212.0 lb

## 2017-08-13 DIAGNOSIS — R7302 Impaired glucose tolerance (oral): Secondary | ICD-10-CM | POA: Diagnosis not present

## 2017-08-13 DIAGNOSIS — Z0001 Encounter for general adult medical examination with abnormal findings: Secondary | ICD-10-CM | POA: Diagnosis not present

## 2017-08-13 DIAGNOSIS — R454 Irritability and anger: Secondary | ICD-10-CM | POA: Insufficient documentation

## 2017-08-13 DIAGNOSIS — H6091 Unspecified otitis externa, right ear: Secondary | ICD-10-CM

## 2017-08-13 DIAGNOSIS — Z0000 Encounter for general adult medical examination without abnormal findings: Secondary | ICD-10-CM

## 2017-08-13 MED ORDER — LORAZEPAM 0.5 MG PO TABS: 0.5000 mg | ORAL_TABLET | Freq: Two times a day (BID) | ORAL | 1 refills | Status: DC | PRN

## 2017-08-13 MED ORDER — NEOMYCIN-POLYMYXIN-HC 1 % OT SOLN: 3.0000 [drp] | Freq: Four times a day (QID) | OTIC | 0 refills | Status: DC

## 2017-08-13 NOTE — Patient Instructions (Addendum)
Please take all new medication as prescribed -  Ativan 0.5 mg twice per day as needed, and ear antibiotic  Please call if worsening, and we could consider seroquel for behavior  Please continue all other medications as before, and refills have been done if requested.  Please have the pharmacy call with any other refills you may need.  Please keep your appointments with your specialists as you may have planned

## 2017-08-13 NOTE — Progress Notes (Signed)
Subjective:    Patient ID: Scott Curtis, male    DOB: [birth year], 27 y.o.   MRN: [id number]  HPI  Here for wellness and f/u with mother who gives most of the hx;  Overall doing ok;  Pt denies Chest pain, worsening SOB, DOE, wheezing, orthopnea, PND, worsening LE edema, palpitations, dizziness or syncope.  Pt denies neurological change such as new headache, facial or extremity weakness.  Pt denies polydipsia, polyuria, or low sugar symptoms. Pt states overall good compliance with treatment and medications, good tolerability, and has been trying to follow appropriate diet.  Pt denies worsening depressive symptoms, suicidal ideation or panic. No fever, night sweats, wt loss, loss of appetite, or other constitutional symptoms.  Pt states good ability with ADL's, has low fall risk, home safety reviewed and adequate, no other significant changes in hearing or vision, and only occasionally active with exercise. Declines labs or immunizations.   Aggressive towards mom, now 212 lbs but usually is gentle giant but gets frustrated.  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  Has lost wt with better diet.  Wt Readings from Last 3 Encounters:  08/13/17 212 lb (96.2 kg)  08/11/17 223 lb (101.2 kg)  12/11/16 223 lb (101.2 kg)  Also c/o right ear pain and swelling mild for 1 wk without d/c, perceived hearing loss, apparent vertigo, ST or fever.  Has been batting at the ear.  Was seen recently in ED with aggressive behavior, mother quite supportive and states she can get him to take meds when needed, asks for ativan prn type med. Mother states ? Mild worsening depressive symptoms though pt denies to her, and no suicidal ideation, or panic; has ongoing anxiety with frustration at times Past Medical History:  Diagnosis Date  . Development delay 09/14/2011  . Impaired glucose tolerance 09/24/2012  . Mental retardation 09/14/2011  . Obesity 09/14/2011  . Seizure disorder ([facility]) 09/14/2011   Past Surgical History:    Procedure Laterality Date  . NO PAST SURGERIES      reports that  has never smoked. he has never used smokeless tobacco. He reports that he does not drink alcohol or use drugs. family history includes Arthritis in his other; Cancer in his other; Hypertension in his other. Allergies  Allergen Reactions  . Codeine Hives  . Penicillin G Rash    Has patient had a PCN reaction causing immediate rash, facial/tongue/throat swelling, SOB or lightheadedness with hypotension: Unknown Has patient had a PCN reaction causing severe rash involving mucus membranes or skin necrosis: No Has patient had a PCN reaction that required hospitalization: No Has patient had a PCN reaction occurring within the last 10 years: No If all of the above answers are "NO", then may proceed with Cephalosporin use.    Current Outpatient Medications on File Prior to Visit  Medication Sig Dispense Refill  . clotrimazole-betamethasone (LOTRISONE) cream Use as directed to affected area twice per day as needed 45 g 1  . ketoconazole (NIZORAL) 2 % cream Apply 1 application topically 2 (two) times daily. To ear & finger 30 g 0  . Olopatadine HCl (PATADAY) 0.2 % SOLN 1-2 drops each eye twice per day as needed 2.5 mL 5  . ondansetron (ZOFRAN ODT) 4 MG disintegrating tablet Take 1 tablet (4 mg total) by mouth every 8 (eight) hours as needed for nausea or vomiting. 10 tablet 0   No current facility-administered medications on file prior to visit.    Review of Systems Unable  due to pt not communicative All otherwise neg per mother    Objective:   Physical Exam BP 128/86   Pulse 73   Ht 5' (1.524 m)   Wt 212 lb (96.2 kg)   SpO2 99%   BMI 41.40 kg/m  VS noted,  Constitutional: Pt appears in NAD HENT: Head: NCAT.  Right Ear: External ear normal. Right ext canal with 1+ red, tender, swelling and slight mucoid d/c Left Ear: External ear normal.  Eyes: . Pupils are equal, round, and reactive to light. Conjunctivae and EOM  are normal Nose: without d/c or deformity Neck: Neck supple. Gross normal ROM Cardiovascular: Normal rate and regular rhythm.   Pulmonary/Chest: Effort normal and breath sounds without rales or wheezing.  Abd:  Soft, NT, ND, + BS, no organomegaly Neurological: Pt is alert. At baseline orientation, motor grossly intact Skin: Skin is warm. No rashes, other new lesions, no LE edema Psychiatric: Pt behavior is normal without agitation  No other exam findings     Assessment & Plan:

## 2017-08-17 ENCOUNTER — Encounter: Payer: Self-pay | Admitting: Internal Medicine

## 2017-08-17 NOTE — Assessment & Plan Note (Addendum)
Possible mild depression, but mother only willing to accept ativan prn for Very occasional use only  In addition to the time spent performing CPE, I spent an additional 10 minutes face to face,in which greater than 50% of this time was spent in counseling and coordination of care for patient's illness as documented, including the differential dx, treatment, further evaluation and other management of right external otitis media, hyperglycemia, anger and irritability

## 2017-08-17 NOTE — Assessment & Plan Note (Signed)

## 2017-08-17 NOTE — Assessment & Plan Note (Signed)
asympt per mother, for a1c with next labs, decline POCT today

## 2017-08-17 NOTE — Assessment & Plan Note (Signed)
Mild to mod, for topical antibx course,  to f/u any worsening symptoms or concerns 

## 2017-08-19 ENCOUNTER — Inpatient Hospital Stay: Payer: Medicare Other | Admitting: Internal Medicine

## 2017-09-10 ENCOUNTER — Other Ambulatory Visit: Payer: Self-pay | Admitting: Internal Medicine

## 2017-10-15 ENCOUNTER — Ambulatory Visit: Payer: Self-pay | Admitting: *Deleted

## 2017-10-15 NOTE — Telephone Encounter (Signed)
Attempted  To  Call  Both numbers    Home  Number  vm not  Set  Up  Yet

## 2017-10-16 ENCOUNTER — Ambulatory Visit (INDEPENDENT_AMBULATORY_CARE_PROVIDER_SITE_OTHER): Payer: Medicare Other | Admitting: Internal Medicine

## 2017-10-16 ENCOUNTER — Encounter: Payer: Self-pay | Admitting: Internal Medicine

## 2017-10-16 VITALS — BP 114/80 | HR 72 | Temp 97.6°F | Ht 60.0 in | Wt 214.0 lb

## 2017-10-16 DIAGNOSIS — R030 Elevated blood-pressure reading, without diagnosis of hypertension: Secondary | ICD-10-CM

## 2017-10-16 DIAGNOSIS — K4090 Unilateral inguinal hernia, without obstruction or gangrene, not specified as recurrent: Secondary | ICD-10-CM

## 2017-10-16 DIAGNOSIS — H1013 Acute atopic conjunctivitis, bilateral: Secondary | ICD-10-CM

## 2017-10-16 DIAGNOSIS — R7302 Impaired glucose tolerance (oral): Secondary | ICD-10-CM | POA: Diagnosis not present

## 2017-10-16 MED ORDER — AZELASTINE HCL 0.05 % OP SOLN: 1.0000 [drp] | Freq: Two times a day (BID) | OPHTHALMIC | 12 refills | Status: DC

## 2017-10-16 MED ORDER — TRIAMCINOLONE ACETONIDE 55 MCG/ACT NA AERO: 2.0000 | INHALATION_SPRAY | Freq: Every day | NASAL | 12 refills | Status: DC

## 2017-10-16 NOTE — Patient Instructions (Addendum)
You will be contacted regarding the referral for: General Surgury (urgent)  Please take all new medication as prescribed - the eye drops and the nasal spray  Please continue all other medications as before, and refills have been done if requested.  Please have the pharmacy call with any other refills you may need.  Please keep your appointments with your specialists as you may have planned

## 2017-10-16 NOTE — Progress Notes (Signed)
   Subjective:    Patient ID: Scott Curtis, male    DOB: Jun 21, 1991, 27 y.o.   MRN: [id number]  HPI  Here with mother for acute visit, pt is disabled and not responding to questions today, will only read his cell phon, but she explains she noted a swelling to left groin area with clothes off for back a few days ago; it was tender at the time, and cont'd to complain of pain later, now present for evaluation.  No prior hx of hernia.  Denies worsening reflux, other abd pain, dysphagia, n/v, bowel change or blood. No fever. Pain overall mild to mod but sensitive to touch and have not tried to push too hard on it.  Pt denies chest pain, increased sob or doe, wheezing, orthopnea, PND, increased LE swelling, palpitations, dizziness or syncope.  Does also have several wks ongoing nasal allergy symptoms with clearish congestion, itch and sneezing, without fever, pain, ST, cough, swelling or wheezing, but with marked eye symptoms of itch and clearish d/c as well.   Pt denies polydipsia, polyuria,  Past Medical History:  Diagnosis Date  . Development delay 09/14/2011  . Impaired glucose tolerance 09/24/2012  . Mental retardation 09/14/2011  . Obesity 09/14/2011  . Seizure disorder ([facility]) 09/14/2011   Past Surgical History:  Procedure Laterality Date  . NO PAST SURGERIES      reports that he has never smoked. He has never used smokeless tobacco. He reports that he does not drink alcohol or use drugs. family history includes Arthritis in his other; Cancer in his other; Hypertension in his other. Allergies  Allergen Reactions  . Codeine Hives  . Penicillin G Rash    Has patient had a PCN reaction causing immediate rash, facial/tongue/throat swelling, SOB or lightheadedness with hypotension: Unknown Has patient had a PCN reaction causing severe rash involving mucus membranes or skin necrosis: No Has patient had a PCN reaction that required hospitalization: No Has patient had a PCN reaction occurring within the  last 10 years: No If all of the above answers are "NO", then may proceed with Cephalosporin use.    No current outpatient medications on file prior to visit.   No current facility-administered medications on file prior to visit.    Review of Systems Pt unwilling today    Objective:   Physical Exam BP 114/80   Pulse 72   Temp 97.6 F (36.4 C) (Oral)   Ht 5' (1.524 m)   Wt 214 lb (97.1 kg)   SpO2 98%   BMI 41.79 kg/m  VS noted,  Constitutional: Pt appears in NAD HENT: Head: NCAT.  Right Ear: External ear normal.  Left Ear: External ear normal.  Eyes: . Pupils are equal, round, and reactive to light. Conjunctivae with mod erythema and d/c, and EOM are normal Nose: without d/c or deformity Neck: Neck supple. Gross normal ROM Cardiovascular: Normal rate and regular rhythm.   Pulmonary/Chest: Effort normal and breath sounds without rales or wheezing.  Abd:  Soft, NT, ND, + BS, no organomegaly, but left groin with mild tender swelling not easily reduced, no rash and penis/scrotum o/w normal in appearance Neurological: Pt is alert. At baseline orientation, motor grossly intact Skin: Skin is warm. No rashes, other new lesions, no LE edema Psychiatric: Pt behavior is normal without agitation  No other exam findings    Assessment & Plan:

## 2017-10-17 NOTE — Assessment & Plan Note (Signed)
New onset, symptomatic, for general surgury referral

## 2017-10-17 NOTE — Assessment & Plan Note (Signed)
Mild to mod likely seasonal reaction, for optivar and nasacort asd

## 2017-10-17 NOTE — Assessment & Plan Note (Signed)
stable overall by history and exam, recent data reviewed with pt, and pt to continue medical treatment as before,  to f/u any worsening symptoms or concerns  

## 2017-11-11 DIAGNOSIS — R1032 Left lower quadrant pain: Secondary | ICD-10-CM | POA: Diagnosis not present

## 2017-11-13 ENCOUNTER — Other Ambulatory Visit: Payer: Self-pay | Admitting: Surgery

## 2017-11-13 DIAGNOSIS — R1032 Left lower quadrant pain: Secondary | ICD-10-CM

## 2017-11-19 ENCOUNTER — Ambulatory Visit
Admission: RE | Admit: 2017-11-19 | Discharge: 2017-11-19 | Disposition: A | Discharge status: Home / Self Care | Payer: Medicare Other | Source: Ambulatory Visit | Attending: Surgery | Admitting: Surgery

## 2017-11-19 DIAGNOSIS — R1032 Left lower quadrant pain: Secondary | ICD-10-CM

## 2017-11-19 DIAGNOSIS — R1030 Lower abdominal pain, unspecified: Secondary | ICD-10-CM | POA: Diagnosis not present

## 2017-11-19 MED ORDER — IOPAMIDOL (ISOVUE-300) INJECTION 61%
100.0000 mL | Freq: Once | INTRAVENOUS | Status: AC | PRN
  Administered 2017-11-19: 100 mL via INTRAVENOUS

## 2017-11-25 ENCOUNTER — Telehealth: Payer: Self-pay | Admitting: Internal Medicine

## 2017-11-25 MED ORDER — ESCITALOPRAM OXALATE 10 MG PO TABS: 10.0000 mg | ORAL_TABLET | Freq: Every day | ORAL | 3 refills | Status: DC

## 2017-11-25 NOTE — Telephone Encounter (Signed)
Copied from CRM [id number]. Topic: Quick Communication - See Telephone Encounter >> Nov 25, 2017 12:07 PM Arlyss Gandy, NT wrote: CRM for notification. See Telephone encounter for: 11/25/17. Pts mom calling to see if something else can be ordered for her sons anxiety. She states the current medication he is on is not helping.

## 2017-11-25 NOTE — Telephone Encounter (Signed)
Ok for start lexapro 10 qd - done erx

## 2017-11-25 NOTE — Telephone Encounter (Signed)
Pt's mother, [name], has been informed.  

## 2017-11-26 ENCOUNTER — Telehealth: Payer: Self-pay | Admitting: Internal Medicine

## 2017-11-26 NOTE — Telephone Encounter (Signed)
Copied from CRM [id number]. Topic: Quick Communication - See Telephone Encounter >> Nov 26, 2017  1:03 PM Raquel Sarna wrote: Pt's Mother called about anxiety Rx prescribed to pt is too strong escitalopram (LEXAPRO) 10 MG tablet.

## 2017-11-26 NOTE — Telephone Encounter (Signed)
This is the usual dose, I 'm really not sure why she would think it is too strong.  I will not start xanax or other medication similar as these are controlled substances that we dont want him to get hooked on

## 2017-11-27 NOTE — Telephone Encounter (Signed)
LVM for pt's mother, Gweneth Dimitri, with details below per PCP.

## 2017-12-05 ENCOUNTER — Telehealth: Payer: Self-pay | Admitting: Internal Medicine

## 2017-12-05 NOTE — Telephone Encounter (Signed)
Called pt's mother, LVM with details below.

## 2017-12-05 NOTE — Telephone Encounter (Signed)
This could be the new medication side effect, but would be unusual at this dose and his weight.  Please continue as is, but call on Monday if symptoms persist.  If has anything else new such as fever, he would need ROV.  Otherwise we would consider change of the lexapro

## 2017-12-05 NOTE — Telephone Encounter (Signed)
Copied from CRM [id number]. Topic: Quick Communication - See Telephone Encounter >> Dec 05, 2017 11:38 AM Raquel Sarna wrote: Pt's mother is calling regarding pt and his sleeping excessively, behavior has calmed down, but not active and very tired.  escitalopram (LEXAPRO) 10 MG tablet

## 2018-02-23 ENCOUNTER — Ambulatory Visit (INDEPENDENT_AMBULATORY_CARE_PROVIDER_SITE_OTHER): Payer: Medicare Other | Admitting: Internal Medicine

## 2018-02-23 ENCOUNTER — Encounter: Payer: Self-pay | Admitting: Internal Medicine

## 2018-02-23 VITALS — BP 126/84 | HR 61 | Temp 98.7°F | Ht 60.0 in | Wt 233.0 lb

## 2018-02-23 DIAGNOSIS — J069 Acute upper respiratory infection, unspecified: Secondary | ICD-10-CM | POA: Diagnosis not present

## 2018-02-23 DIAGNOSIS — R454 Irritability and anger: Secondary | ICD-10-CM

## 2018-02-23 DIAGNOSIS — J309 Allergic rhinitis, unspecified: Secondary | ICD-10-CM

## 2018-02-23 MED ORDER — ESCITALOPRAM OXALATE 20 MG PO TABS: 20.0000 mg | ORAL_TABLET | Freq: Every day | ORAL | 3 refills | Status: DC

## 2018-02-23 MED ORDER — FEXOFENADINE HCL 180 MG PO TABS: 180.0000 mg | ORAL_TABLET | Freq: Every day | ORAL | 11 refills | Status: DC

## 2018-02-23 MED ORDER — AZITHROMYCIN 250 MG PO TABS: ORAL_TABLET | ORAL | 1 refills | Status: DC

## 2018-02-23 NOTE — Assessment & Plan Note (Signed)
For allegra asd,  to f/u any worsening symptoms or concerns 

## 2018-02-23 NOTE — Progress Notes (Signed)
Subjective:    Patient ID: Scott Curtis, male    DOB: Jan 19, 1991, 27 y.o.   MRN: [id number]  HPI   Here with 2-3 days acute onset fever, facial pain, pressure, headache, general weakness and malaise, and greenish d/c, with mild ST and cough, but pt denies chest pain, wheezing, increased sob or doe, orthopnea, PND, increased LE swelling, palpitations, dizziness or syncope.  Does have several wks ongoing nasal allergy symptoms with clearish congestion, itch and sneezing, without fever, pain, ST, cough, swelling or wheezing.  Also per mom has "control issues" in that he gets upset and agitated and difficult to handle when things done happen that he wants them to.  Denies worsening depressive symptoms, suicidal ideation, or panic; but lexapro has certainly helped, and mom is asking for increased dose. Past Medical History:  Diagnosis Date  . Development delay 09/14/2011  . Impaired glucose tolerance 09/24/2012  . Mental retardation 09/14/2011  . Obesity 09/14/2011  . Seizure disorder (HCC) 09/14/2011   Past Surgical History:  Procedure Laterality Date  . NO PAST SURGERIES      reports that he has never smoked. He has never used smokeless tobacco. He reports that he does not drink alcohol or use drugs. family history includes Arthritis in his other; Cancer in his other; Hypertension in his other. Allergies  Allergen Reactions  . Codeine Hives  . Penicillin G Rash    Has patient had a PCN reaction causing immediate rash, facial/tongue/throat swelling, SOB or lightheadedness with hypotension: Unknown Has patient had a PCN reaction causing severe rash involving mucus membranes or skin necrosis: No Has patient had a PCN reaction that required hospitalization: No Has patient had a PCN reaction occurring within the last 10 years: No If all of the above answers are "NO", then may proceed with Cephalosporin use.    Current Outpatient Medications on File Prior to Visit  Medication Sig Dispense Refill    . azelastine (OPTIVAR) 0.05 % ophthalmic solution Place 1 drop into both eyes 2 (two) times daily. 6 mL 12  . triamcinolone (NASACORT) 55 MCG/ACT AERO nasal inhaler Place 2 sprays into the nose daily. 1 Inhaler 12   No current facility-administered medications on file prior to visit.    Review of Systems  Constitutional: Negative for other unusual diaphoresis or sweats HENT: Negative for ear discharge or swelling Eyes: Negative for other worsening visual disturbances Respiratory: Negative for stridor or other swelling  Gastrointestinal: Negative for worsening distension or other blood Genitourinary: Negative for retention or other urinary change Musculoskeletal: Negative for other MSK pain or swelling Skin: Negative for color change or other new lesions Neurological: Negative for worsening tremors and other numbness  Psychiatric/Behavioral: Negative for worsening agitation or other fatigue All other system neg per pt    Objective:   Physical Exam BP 126/84   Pulse 61   Temp 98.7 F (37.1 C) (Oral)   Ht 5' (1.524 m)   Wt 233 lb (105.7 kg)   SpO2 98%   BMI 45.50 kg/m  VS noted,  Constitutional: Pt appears in NAD HENT: Head: NCAT.  Right Ear: External ear normal.  Left Ear: External ear normal.  Eyes: . Pupils are equal, round, and reactive to light. Conjunctivae and EOM are normal Bilat tm's with mild erythema.  Max sinus areas mild tender.  Pharynx with mild erythema, no exudate Nose: without d/c or deformity Neck: Neck supple. Gross normal ROM Cardiovascular: Normal rate and regular rhythm.   Pulmonary/Chest: Effort normal  and breath sounds without rales or wheezing.  Neurological: Pt is alert. At baseline orientation, motor grossly intact Skin: Skin is warm. No rashes, other new lesions, no LE edema Psychiatric: Pt behavior is normal without agitation  No other exam findings    Assessment & Plan:

## 2018-02-23 NOTE — Patient Instructions (Addendum)
Please take all new medication as prescribed - the antibiotic, and allegra for allergies  OK to increase the lexapro to 20 mg per day  Please continue all other medications as before, and refills have been done if requested.  Please have the pharmacy call with any other refills you may need.  Please keep your appointments with your specialists as you may have planned

## 2018-02-23 NOTE — Assessment & Plan Note (Signed)
Improved, ok for increased lexapo 20 qd

## 2018-03-06 ENCOUNTER — Encounter: Payer: Self-pay | Admitting: Internal Medicine

## 2018-03-06 ENCOUNTER — Ambulatory Visit (INDEPENDENT_AMBULATORY_CARE_PROVIDER_SITE_OTHER): Payer: Medicare Other | Admitting: Internal Medicine

## 2018-03-06 VITALS — BP 120/78 | HR 69 | Temp 97.9°F | Ht 60.0 in | Wt 224.2 lb

## 2018-03-06 DIAGNOSIS — J309 Allergic rhinitis, unspecified: Secondary | ICD-10-CM | POA: Diagnosis not present

## 2018-03-06 DIAGNOSIS — H109 Unspecified conjunctivitis: Secondary | ICD-10-CM | POA: Diagnosis not present

## 2018-03-06 DIAGNOSIS — J069 Acute upper respiratory infection, unspecified: Secondary | ICD-10-CM

## 2018-03-06 DIAGNOSIS — R7302 Impaired glucose tolerance (oral): Secondary | ICD-10-CM | POA: Diagnosis not present

## 2018-03-06 DIAGNOSIS — R454 Irritability and anger: Secondary | ICD-10-CM | POA: Diagnosis not present

## 2018-03-06 MED ORDER — ERYTHROMYCIN 5 MG/GM OP OINT: 1.0000 "application " | TOPICAL_OINTMENT | Freq: Four times a day (QID) | OPHTHALMIC | 0 refills | Status: AC

## 2018-03-06 MED ORDER — SULFAMETHOXAZOLE-TRIMETHOPRIM 800-160 MG PO TABS: 1.0000 | ORAL_TABLET | Freq: Two times a day (BID) | ORAL | 0 refills | Status: DC

## 2018-03-06 NOTE — Assessment & Plan Note (Signed)
Also for eryth ophthalmic asd,  to f/u any worsening symptoms or concerns

## 2018-03-06 NOTE — Assessment & Plan Note (Signed)
stable overall by history and exam, recent data reviewed with pt, and pt to continue medical treatment as before,  to f/u any worsening symptoms or concerns  

## 2018-03-06 NOTE — Assessment & Plan Note (Signed)
Mild, cont allegra but consider add nasacort asd

## 2018-03-06 NOTE — Assessment & Plan Note (Signed)
Worsening, now with left ear possible otittis media, has PCN allergy, for septra ds

## 2018-03-06 NOTE — Progress Notes (Signed)
Subjective:    Patient ID: Scott Curtis, male    DOB: 1990/11/19, 27 y.o.   MRN: [id number]  HPI   Here with mother with c/o  2-3 days acute onset fever, facial pain, pressure, severe left ear pain, headache, marked left conjuncival erythema with some weepiness, general weakness and malaise, and greenish d/c, with mild ST and cough, but pt denies chest pain, wheezing, increased sob or doe, orthopnea, PND, increased LE swelling, palpitations, dizziness or syncope.  Lexapro has worked out very well and no longer exhibits such irritability and anger, mom is quite pleased, says "I should have asked long ago."   Does have several wks ongoing nasal allergy symptoms with clearish congestion, itch and sneezing, without fever, pain, ST, cough, swelling or wheezing, but better on days he takes the allegra and optivar. Past Medical History:  Diagnosis Date  . Development delay 09/14/2011  . Impaired glucose tolerance 09/24/2012  . Mental retardation 09/14/2011  . Obesity 09/14/2011  . Seizure disorder (HCC) 09/14/2011   Past Surgical History:  Procedure Laterality Date  . NO PAST SURGERIES      reports that he has never smoked. He has never used smokeless tobacco. He reports that he does not drink alcohol or use drugs. family history includes Arthritis in his other; Cancer in his other; Hypertension in his other. Allergies  Allergen Reactions  . Codeine Hives  . Penicillin G Rash    Has patient had a PCN reaction causing immediate rash, facial/tongue/throat swelling, SOB or lightheadedness with hypotension: Unknown Has patient had a PCN reaction causing severe rash involving mucus membranes or skin necrosis: No Has patient had a PCN reaction that required hospitalization: No Has patient had a PCN reaction occurring within the last 10 years: No If all of the above answers are "NO", then may proceed with Cephalosporin use.    Current Outpatient Medications on File Prior to Visit  Medication Sig  Dispense Refill  . azelastine (OPTIVAR) 0.05 % ophthalmic solution Place 1 drop into both eyes 2 (two) times daily. 6 mL 12  . escitalopram (LEXAPRO) 20 MG tablet Take 1 tablet (20 mg total) by mouth daily. 90 tablet 3  . fexofenadine (ALLEGRA) 180 MG tablet Take 1 tablet (180 mg total) by mouth daily. 30 tablet 11  . triamcinolone (NASACORT) 55 MCG/ACT AERO nasal inhaler Place 2 sprays into the nose daily. 1 Inhaler 12   No current facility-administered medications on file prior to visit.    Review of Systems  Constitutional: Negative for other unusual diaphoresis or sweats HENT: Negative for ear discharge or swelling Eyes: Negative for other worsening visual disturbances Respiratory: Negative for stridor or other swelling  Gastrointestinal: Negative for worsening distension or other blood Genitourinary: Negative for retention or other urinary change Musculoskeletal: Negative for other MSK pain or swelling Skin: Negative for color change or other new lesions Neurological: Negative for worsening tremors and other numbness  Psychiatric/Behavioral: Negative for worsening agitation or other fatigue All other system neg per pt    Objective:   Physical Exam BP 120/78 (BP Location: Left Arm, Patient Position: Sitting, Cuff Size: Normal)   Pulse 69   Temp 97.9 F (36.6 C) (Oral)   Ht 5' (1.524 m)   Wt 224 lb 4 oz (101.7 kg)   SpO2 98%   BMI 43.80 kg/m  VS noted, mild ill Constitutional: Pt appears in NAD HENT: Head: NCAT.  Right Ear: External ear normal.  Left Ear: External ear normal.  Bilat  tm's with erythema  - left severe, right mild  Max sinus areas mild tender.  Pharynx with mild erythema, no exudate Eyes: . Pupils are equal, round, and reactive to light. Conjunctivae with marked erythema and weepiness, and EOM are normal Nose: without d/c or deformity Neck: Neck supple. Gross normal ROM Cardiovascular: Normal rate and regular rhythm.   Pulmonary/Chest: Effort normal and  breath sounds without rales or wheezing.  Neurological: Pt is alert. At baseline orientation, motor grossly intact Skin: Skin is warm. No rashes, other new lesions, no LE edema Psychiatric: Pt behavior is normal without agitation  No other exam findings    Assessment & Plan:

## 2018-03-06 NOTE — Assessment & Plan Note (Signed)
Much improved, cont lexapro now taking at 10 mg

## 2018-03-06 NOTE — Patient Instructions (Signed)
Please take all new medication as prescribed - the pill antibiotic (septra) and eye ointment as directed  Please continue all other medications as before, and refills have been done if requested.  Please have the pharmacy call with any other refills you may need.  Please keep your appointments with your specialists as you may have planned

## 2018-03-12 DIAGNOSIS — H938X3 Other specified disorders of ear, bilateral: Secondary | ICD-10-CM | POA: Diagnosis not present

## 2018-03-12 DIAGNOSIS — B369 Superficial mycosis, unspecified: Secondary | ICD-10-CM | POA: Diagnosis not present

## 2018-03-12 DIAGNOSIS — H6243 Otitis externa in other diseases classified elsewhere, bilateral: Secondary | ICD-10-CM | POA: Diagnosis not present

## 2018-04-01 ENCOUNTER — Telehealth: Payer: Self-pay

## 2018-04-01 DIAGNOSIS — H9202 Otalgia, left ear: Secondary | ICD-10-CM

## 2018-04-01 NOTE — Telephone Encounter (Signed)
Ok for referral urgent to ENT  - done

## 2018-04-01 NOTE — Telephone Encounter (Signed)
Copied from CRM [id number]. Topic: General - Other >> Apr 01, 2018  9:38 AM Windy Kalata, NT wrote: Reason for CRM: patient mother is calling and states that she has brought the patient in twice for his left ear pain and the medicine has not helped. She would like to know what does Dr. Jonny Ruiz suggest.

## 2018-04-01 NOTE — Addendum Note (Signed)
Addended by: Corwin Levins on: 04/01/2018 12:36 PM   Modules accepted: Orders

## 2018-04-16 DIAGNOSIS — H9202 Otalgia, left ear: Secondary | ICD-10-CM | POA: Insufficient documentation

## 2018-04-16 DIAGNOSIS — H938X3 Other specified disorders of ear, bilateral: Secondary | ICD-10-CM | POA: Diagnosis not present

## 2018-05-13 DIAGNOSIS — B369 Superficial mycosis, unspecified: Secondary | ICD-10-CM | POA: Diagnosis not present

## 2018-05-13 DIAGNOSIS — H6243 Otitis externa in other diseases classified elsewhere, bilateral: Secondary | ICD-10-CM | POA: Diagnosis not present

## 2018-06-08 DIAGNOSIS — H6240 Otitis externa in other diseases classified elsewhere, unspecified ear: Secondary | ICD-10-CM | POA: Diagnosis not present

## 2018-06-08 DIAGNOSIS — H6123 Impacted cerumen, bilateral: Secondary | ICD-10-CM | POA: Diagnosis not present

## 2018-06-08 DIAGNOSIS — B369 Superficial mycosis, unspecified: Secondary | ICD-10-CM | POA: Diagnosis not present

## 2018-06-17 ENCOUNTER — Ambulatory Visit (INDEPENDENT_AMBULATORY_CARE_PROVIDER_SITE_OTHER): Payer: Medicare Other | Admitting: Family

## 2018-06-17 ENCOUNTER — Encounter: Payer: Self-pay | Admitting: Family

## 2018-06-17 VITALS — BP 122/78 | HR 68 | Temp 97.8°F | Ht 60.0 in | Wt 245.0 lb

## 2018-06-17 DIAGNOSIS — L600 Ingrowing nail: Secondary | ICD-10-CM | POA: Diagnosis not present

## 2018-06-17 DIAGNOSIS — R05 Cough: Secondary | ICD-10-CM

## 2018-06-17 DIAGNOSIS — R059 Cough, unspecified: Secondary | ICD-10-CM

## 2018-06-17 MED ORDER — CEPHALEXIN 500 MG PO CAPS: 500.0000 mg | ORAL_CAPSULE | Freq: Three times a day (TID) | ORAL | 0 refills | Status: DC

## 2018-06-17 NOTE — Progress Notes (Signed)
Scott Curtis is a 27 y.o. male with the following history as recorded in [employer]:  Patient Active Problem List   Diagnosis Date Noted  . Left conjunctivitis 03/06/2018  . URI (upper respiratory infection) 02/23/2018  . Left inguinal hernia 10/16/2017  . Irritability and anger 08/13/2017  . External otitis of right ear 08/13/2017  . Elevated blood-pressure reading without diagnosis of hypertension 09/15/2016  . Scalp lesion 09/10/2016  . Rash and nonspecific skin eruption 04/06/2015  . Allergic rhinitis 10/26/2014  . Impaired glucose tolerance 09/24/2012  . Acne 09/24/2012  . Allergic conjunctivitis 09/17/2011  . Seizure disorder (HCC) 09/14/2011  . Encounter for well adult exam with abnormal findings 09/14/2011  . Development delay 09/14/2011  . Mental retardation 09/14/2011  . Obesity 09/14/2011    Current Outpatient Medications  Medication Sig Dispense Refill  . escitalopram (LEXAPRO) 10 MG tablet Take 10 mg by mouth daily.  3  . escitalopram (LEXAPRO) 20 MG tablet Take 1 tablet (20 mg total) by mouth daily. 90 tablet 3  . cephALEXin (KEFLEX) 500 MG capsule Take 1 capsule (500 mg total) by mouth 3 (three) times daily. 15 capsule 0   No current facility-administered medications for this visit.     Allergies: Codeine and Penicillin g  Past Medical History:  Diagnosis Date  . Development delay 09/14/2011  . Impaired glucose tolerance 09/24/2012  . Mental retardation 09/14/2011  . Obesity 09/14/2011  . Seizure disorder (HCC) 09/14/2011    Past Surgical History:  Procedure Laterality Date  . NO PAST SURGERIES      Family History  Problem Relation Age of Onset  . Arthritis Other   . Hypertension Other   . Cancer Other        breast cancer  . Neuropathy Neg Hx     Social History   Tobacco Use  . Smoking status: Never Smoker  . Smokeless tobacco: Never Used  Substance Use Topics  . Alcohol use: No    Subjective:  Patient presents with concerns for ingrown toenail-  right foot/ 1st toe; accompanied by his mother who provides majority of history; symptoms present x 1 month; has never had problems with ingrown nails previously; not done any type of Epsom salt soaks;   Mother is also concerned about cough- does have seasonal allergies/ not currently on any type of medication; no fever per mother;    Objective:  Vitals:   06/17/18 1108  BP: 122/78  Pulse: 68  Temp: 97.8 F (36.6 C)  TempSrc: Oral  SpO2: 98%  Weight: 245 lb 0.6 oz (111.1 kg)  Height: 5' (1.524 m)    General: Well developed, well nourished, in no acute distress  Skin : Warm and dry.  Head: Normocephalic and atraumatic  Eyes: Sclera and conjunctiva clear; pupils round and reactive to light; extraocular movements intact  Ears: External normal; canals clear; tympanic membranes normal  Oropharynx: Pink, supple. No suspicious lesions/ limited exam as patient would not allow me to use tongue depressor Neck: Supple without thyromegaly, adenopathy  Lungs: Respirations unlabored; clear to auscultation bilaterally without wheeze, rales, rhonchi  CVS exam: normal rate and regular rhythm.  Extremities: No edema, cyanosis, clubbing  Vessels: Symmetric bilaterally  Neurologic: Alert and oriented; speech intact; face symmetrical; moves all extremities well; CNII-XII intact without focal deficit   Assessment:  1. Ingrown toenail of right foot   2. Cough     Plan:  1. Recommend Epsom salt soaks; will use short-term Rx for Keflex 500 mg  tid x 5 days ( has tolerated in the past with no difficulty); refer to podiatry; 2. Suspect allergy component; Recommend to re-start Allegra;    No follow-ups on file.  Orders Placed This Encounter  Procedures  . Ambulatory referral to Podiatry    Referral Priority:   Routine    Referral Type:   Consultation    Referral Reason:   Specialty Services Required    Requested Specialty:   Podiatry    Number of Visits Requested:   1    Requested Prescriptions    Signed Prescriptions Disp Refills  . cephALEXin (KEFLEX) 500 MG capsule 15 capsule 0    Sig: Take 1 capsule (500 mg total) by mouth 3 (three) times daily.

## 2018-06-17 NOTE — Patient Instructions (Signed)
Ingrown Toenail An ingrown toenail occurs when the corner or sides of your toenail grow into the surrounding skin. The big toe is most commonly affected, but it can happen to any of your toes. If your ingrown toenail is not treated, you will be at risk for infection. What are the causes? This condition may be caused by:  Wearing shoes that are too small or tight.  Injury or trauma, such as stubbing your toe or having your toe stepped on.  Improper cutting or care of your toenails.  Being born with (congenital) nail or foot abnormalities, such as having a nail that is too big for your toe.  What increases the risk? Risk factors for an ingrown toenail include:  Age. Your nails tend to thicken as you get older, so ingrown nails are more common in older people.  Diabetes.  Cutting your toenails incorrectly.  Blood circulation problems.  What are the signs or symptoms? Symptoms may include:  Pain, soreness, or tenderness.  Redness.  Swelling.  Hardening of the skin surrounding the toe.  Your ingrown toenail may be infected if there is fluid, pus, or drainage. How is this diagnosed? An ingrown toenail may be diagnosed by medical history and physical exam. If your toenail is infected, your health care provider may test a sample of the drainage. How is this treated? Treatment depends on the severity of your ingrown toenail. Some ingrown toenails may be treated at home. More severe or infected ingrown toenails may require surgery to remove all or part of the nail. Infected ingrown toenails may also be treated with antibiotic medicines. Follow these instructions at home:  If you were prescribed an antibiotic medicine, finish all of it even if you start to feel better.  Soak your foot in warm soapy water for 20 minutes, 3 times per day or as directed by your health care provider.  Carefully lift the edge of the nail away from the sore skin by wedging a small piece of cotton under  the corner of the nail. This may help with the pain. Be careful not to cause more injury to the area.  Wear shoes that fit well. If your ingrown toenail is causing you pain, try wearing sandals, if possible.  Trim your toenails regularly and carefully. Do not cut them in a curved shape. Cut your toenails straight across. This prevents injury to the skin at the corners of the toenail.  Keep your feet clean and dry.  If you are having trouble walking and are given crutches by your health care provider, use them as directed.  Do not pick at your toenail or try to remove it yourself.  Take medicines only as directed by your health care provider.  Keep all follow-up visits as directed by your health care provider. This is important. Contact a health care provider if:  Your symptoms do not improve with treatment. Get help right away if:  You have red streaks that start at your foot and go up your leg.  You have a fever.  You have increased redness, swelling, or pain.  You have fluid, blood, or pus coming from your toenail. This information is not intended to replace advice given to you by your health care provider. Make sure you discuss any questions you have with your health care provider. Document Released: 07/05/2000 Document Revised: 12/08/2015 Document Reviewed: 06/01/2014 Elsevier Interactive Patient Education  2018 [employer]  

## 2018-06-26 ENCOUNTER — Encounter: Payer: Self-pay | Admitting: Podiatry

## 2018-06-26 ENCOUNTER — Ambulatory Visit (INDEPENDENT_AMBULATORY_CARE_PROVIDER_SITE_OTHER): Payer: Medicare Other | Admitting: Podiatry

## 2018-06-26 DIAGNOSIS — M79674 Pain in right toe(s): Secondary | ICD-10-CM | POA: Diagnosis not present

## 2018-06-26 DIAGNOSIS — M79675 Pain in left toe(s): Secondary | ICD-10-CM

## 2018-06-26 DIAGNOSIS — M2142 Flat foot [pes planus] (acquired), left foot: Secondary | ICD-10-CM

## 2018-06-26 DIAGNOSIS — M2141 Flat foot [pes planus] (acquired), right foot: Secondary | ICD-10-CM | POA: Diagnosis not present

## 2018-06-26 DIAGNOSIS — B351 Tinea unguium: Secondary | ICD-10-CM

## 2018-06-26 DIAGNOSIS — L600 Ingrowing nail: Secondary | ICD-10-CM

## 2018-06-26 NOTE — Progress Notes (Signed)
Subjective:    Patient ID: Scott Curtis, male    DOB: 23-Apr-1991, 27 y.o.   MRN: [id number]  HPI 27 year old male presents the office today with his mom for concerns of ingrown toenail on the right big toe which is been sore and tender.  The nails are painful with pressure and shoes. The right hallux has ben ingrown for the last month. They have been soaking it in his mom is try to cut the toenail but been unsuccessful.  Also he has flat feet his mom is wondering if we can give him order for new inserts today.  Denies any recent injury or trauma.    Review of Systems  All other systems reviewed and are negative.  Past Medical History:  Diagnosis Date  . Development delay 09/14/2011  . Impaired glucose tolerance 09/24/2012  . Mental retardation 09/14/2011  . Obesity 09/14/2011  . Seizure disorder (HCC) 09/14/2011    Past Surgical History:  Procedure Laterality Date  . NO PAST SURGERIES       Current Outpatient Medications:  .  cephALEXin (KEFLEX) 500 MG capsule, Take 1 capsule (500 mg total) by mouth 3 (three) times daily., Disp: 15 capsule, Rfl: 0 .  escitalopram (LEXAPRO) 10 MG tablet, Take 10 mg by mouth daily., Disp: , Rfl: 3 .  escitalopram (LEXAPRO) 20 MG tablet, Take 1 tablet (20 mg total) by mouth daily., Disp: 90 tablet, Rfl: 3 .  fluocinonide (LIDEX) 0.05 % external solution, , Disp: , Rfl:   Allergies  Allergen Reactions  . Codeine Hives  . Penicillin G Rash    Has patient had a PCN reaction causing immediate rash, facial/tongue/throat swelling, SOB or lightheadedness with hypotension: Unknown Has patient had a PCN reaction causing severe rash involving mucus membranes or skin necrosis: No Has patient had a PCN reaction that required hospitalization: No Has patient had a PCN reaction occurring within the last 10 years: No If all of the above answers are "NO", then may proceed with Cephalosporin use.          Objective:   Physical Exam General:  NAD  Dermatological: Nails appear to be mildly hypertrophic, dystrophic with yellow to brown discoloration but the right hallux toenail the worst and there is incurvation present along the medial aspect of the right hallux toenail.  This is where the majority of tenderness is localized.  There is no edema, erythema or any clinical signs of infection noted today.  Vascular: Dorsalis Pedis artery and Posterior Tibial artery pedal pulses are 2/4 bilateral with immedate capillary fill time. There is no pain with calf compression, swelling, warmth, erythema.   Neruologic: Grossly intact via light touch bilateral. Protective threshold with Semmes Wienstein monofilament intact to all pedal sites bilateral.   Musculoskeletal: Flatfoot deformities present.  There is no area of tenderness that can elicit today toenail on the right medial hallux.  Muscular strength 5/5 in all groups tested bilateral.  Gait: Unassisted, Nonantalgic.     Assessment & Plan:  27 year old male with onychomycosis, right hallux ingrown toenail; flatfoot deformity -Treatment options discussed including all alternatives, risks, and complications -Etiology of symptoms were discussed -They do not want to proceed with partial nail avulsion today.  I did debride the toenails without any complications or bleeding today.  There is some discomfort along the right medial nail corner I try to debride it is much as I could. Dispensed formula #3 to apply for the fungus.  -Will have him see Raiford Noble to get  molded for new inserts.   Vivi BarrackMatthew R Wagoner DPM

## 2018-06-29 ENCOUNTER — Ambulatory Visit: Payer: Self-pay

## 2018-06-29 MED ORDER — AZITHROMYCIN 250 MG PO TABS: ORAL_TABLET | ORAL | 0 refills | Status: DC

## 2018-06-29 NOTE — Telephone Encounter (Signed)
Attempted to call by TN several times, left VM for mother to return call, will route to practice. See below message.   Message from Scott Curtis sent at 06/29/2018 8:23 AM EST ----- Patients mom called to say that he was seen on 06/17/18 for congestion which he was told to take Allegra but said it did not do anything for him. She stated that he was given cephALEXin (KEFLEX) 500 MG capsule for another issue and he took it but is still coughing and very congested. Requesting a Zpak sent to the pharmacy please. Ph# [phone]

## 2018-06-29 NOTE — Addendum Note (Signed)
Addended by: Corwin LevinsJOHN, JAMES W on: 06/29/2018 05:29 PM   Modules accepted: Orders

## 2018-06-29 NOTE — Telephone Encounter (Signed)
Done erx 

## 2018-07-03 ENCOUNTER — Telehealth: Payer: Self-pay | Admitting: Podiatry

## 2018-07-03 NOTE — Telephone Encounter (Signed)
lvm for pts mom to call to get schedule for orthotic measurements.Marland Kitchen..Marland Kitchen

## 2018-08-19 ENCOUNTER — Encounter: Payer: Self-pay | Admitting: Internal Medicine

## 2018-08-19 ENCOUNTER — Ambulatory Visit (INDEPENDENT_AMBULATORY_CARE_PROVIDER_SITE_OTHER): Payer: Medicare Other | Admitting: Internal Medicine

## 2018-08-19 VITALS — BP 118/82 | HR 70 | Temp 98.0°F | Ht 60.0 in | Wt 246.0 lb

## 2018-08-19 DIAGNOSIS — Z114 Encounter for screening for human immunodeficiency virus [HIV]: Secondary | ICD-10-CM | POA: Diagnosis not present

## 2018-08-19 DIAGNOSIS — G4710 Hypersomnia, unspecified: Secondary | ICD-10-CM | POA: Diagnosis not present

## 2018-08-19 DIAGNOSIS — G4733 Obstructive sleep apnea (adult) (pediatric): Secondary | ICD-10-CM | POA: Insufficient documentation

## 2018-08-19 DIAGNOSIS — R7302 Impaired glucose tolerance (oral): Secondary | ICD-10-CM

## 2018-08-19 DIAGNOSIS — Z0000 Encounter for general adult medical examination without abnormal findings: Secondary | ICD-10-CM | POA: Diagnosis not present

## 2018-08-19 NOTE — Patient Instructions (Signed)
Please continue all other medications as before, and refills have been done if requested.  Please have the pharmacy call with any other refills you may need.  Please continue your efforts at being more active, low cholesterol diet, and weight control.  You are otherwise up to date with prevention measures today.  Please keep your appointments with your specialists as you may have planned  You will be contacted regarding the referral for: Pulmonary for possible sleep apnea  Please go to the LAB in the Basement (turn left off the elevator) for the tests to be done at your convenience  You will be contacted by phone if any changes need to be made immediately.  Otherwise, you will receive a letter about your results with an explanation, but please check with MyChart first.  Please remember to sign up for MyChart if you have not done so, as this will be important to you in the future with finding out test results, communicating by private email, and scheduling acute appointments online when needed.  Please return in 1 year for your yearly visit, or sooner if needed, with Lab testing done 3-5 days before

## 2018-08-19 NOTE — Progress Notes (Signed)
Subjective:    Patient ID: Scott Curtis, male    DOB: 10/12/1990, 28 y.o.   MRN: [id number]  HPI  Here for wellness and f/u;  Overall doing ok;  Pt denies Chest pain, worsening SOB, DOE, wheezing, orthopnea, PND, worsening LE edema, palpitations, dizziness or syncope.  Pt denies neurological change such as new headache, facial or extremity weakness.  Pt denies polydipsia, polyuria, or low sugar symptoms. Pt states overall good compliance with treatment and medications, good tolerability, and has been trying to follow appropriate diet.  Pt denies worsening depressive symptoms, suicidal ideation or panic. No fever, night sweats, wt loss, loss of appetite, or other constitutional symptoms.  Pt states good ability with ADL's, has low fall risk, home safety reviewed and adequate, no other significant changes in hearing or vision, and only occasionally active with exercise.  Mother with him c/o his daytime somnolence with recent wt gain, snoring at night Past Medical History:  Diagnosis Date  . Development delay 09/14/2011  . Impaired glucose tolerance 09/24/2012  . Mental retardation 09/14/2011  . Obesity 09/14/2011  . Seizure disorder (HCC) 09/14/2011   Past Surgical History:  Procedure Laterality Date  . NO PAST SURGERIES      reports that he has never smoked. He has never used smokeless tobacco. He reports that he does not drink alcohol or use drugs. family history includes Arthritis in an other family member; Cancer in an other family member; Hypertension in an other family member. Allergies  Allergen Reactions  . Codeine Hives  . Penicillin G Rash    Has patient had a PCN reaction causing immediate rash, facial/tongue/throat swelling, SOB or lightheadedness with hypotension: Unknown Has patient had a PCN reaction causing severe rash involving mucus membranes or skin necrosis: No Has patient had a PCN reaction that required hospitalization: No Has patient had a PCN reaction occurring within  the last 10 years: No If all of the above answers are "NO", then may proceed with Cephalosporin use.    Current Outpatient Medications on File Prior to Visit  Medication Sig Dispense Refill  . escitalopram (LEXAPRO) 10 MG tablet Take 10 mg by mouth daily.  3  . escitalopram (LEXAPRO) 20 MG tablet Take 1 tablet (20 mg total) by mouth daily. 90 tablet 3  . fluocinonide (LIDEX) 0.05 % external solution      No current facility-administered medications on file prior to visit.    Review of Systems Constitutional: Negative for other unusual diaphoresis, sweats, appetite or weight changes HENT: Negative for other worsening hearing loss, ear pain, facial swelling, mouth sores or neck stiffness.   Eyes: Negative for other worsening pain, redness or other visual disturbance.  Respiratory: Negative for other stridor or swelling Cardiovascular: Negative for other palpitations or other chest pain  Gastrointestinal: Negative for worsening diarrhea or loose stools, blood in stool, distention or other pain Genitourinary: Negative for hematuria, flank pain or other change in urine volume.  Musculoskeletal: Negative for myalgias or other joint swelling.  Skin: Negative for other color change, or other wound or worsening drainage.  Neurological: Negative for other syncope or numbness. Hematological: Negative for other adenopathy or swelling Psychiatric/Behavioral: Negative for hallucinations, other worsening agitation, SI, self-injury, or new decreased concentration All other system neg per mother    Objective:   Physical Exam BP 118/82   Pulse 70   Temp 98 F (36.7 C) (Oral)   Ht 5' (1.524 m)   Wt 246 lb (111.6 kg)   SpO2  97%   BMI 48.04 kg/m  VS noted, supermorbid obese Constitutional: Pt is oriented to person, place, and time. Appears well-developed and well-nourished, in no significant distress and comfortable Head: Normocephalic and atraumatic  Eyes: Conjunctivae and EOM are normal. Pupils  are equal, round, and reactive to light Right Ear: External ear normal without discharge Left Ear: External ear normal without discharge Nose: Nose without discharge or deformity Mouth/Throat: Oropharynx is without other ulcerations and moist  Neck: Normal range of motion. Neck supple. No JVD present. No tracheal deviation present or significant neck LA or mass Cardiovascular: Normal rate, regular rhythm, normal heart sounds and intact distal pulses.   Pulmonary/Chest: WOB normal and breath sounds without rales or wheezing  Abdominal: Soft. Bowel sounds are normal. NT. No HSM  Musculoskeletal: Normal range of motion. Exhibits no edema Lymphadenopathy: Has no other cervical adenopathy.  Neurological: Pt is alert and oriented to person, place, and time. Pt has normal reflexes. No cranial nerve deficit. Motor grossly intact, Gait intact Skin: Skin is warm and dry. No rash noted or new ulcerations Psychiatric:  Has normal mood and affect. Behavior is normal without agitation No other exam findings Lab Results  Component Value Date   WBC 6.7 08/11/2017   HGB 13.9 08/11/2017   HCT 41.7 08/11/2017   PLT 241 08/11/2017   GLUCOSE 95 08/11/2017   ALT 17 08/11/2017   AST 23 08/11/2017   NA 138 08/11/2017   K 3.3 (L) 08/11/2017   CL 103 08/11/2017   CREATININE 0.66 08/11/2017   BUN 9 08/11/2017   CO2 27 08/11/2017        Assessment & Plan:

## 2018-08-19 NOTE — Assessment & Plan Note (Signed)
stable overall by history and exam, recent data reviewed with pt, and pt to continue medical treatment as before,  to f/u any worsening symptoms or concerns  

## 2018-08-19 NOTE — Assessment & Plan Note (Signed)

## 2018-08-19 NOTE — Assessment & Plan Note (Signed)
For pulm referral, r/o osa 

## 2018-09-02 IMAGING — CT CT PELVIS W/ CM
2 of 3 series · 12 of 46 positions shown, 14 images · IV contrast (iopamidol)
Comparison: 07/25/2016

CLINICAL DATA: Left groin pain for 1 month.  Possible hernia.

EXAM:
CT PELVIS WITH CONTRAST
TECHNIQUE: Multidetector CT imaging of the pelvis was performed using the
standard protocol following the bolus administration of intravenous
contrast.
CONTRAST:  100mL OQPJL1-MQQ IOPAMIDOL (OQPJL1-MQQ) INJECTION 61%

[Series 2: pelvis 5.00 br40 s3 ax · axial · 0.64mm/px · z∈[+1475,+1700]mm · 9 of 53 slices shown, 11 images]
[im 4/53  soft-tissue]
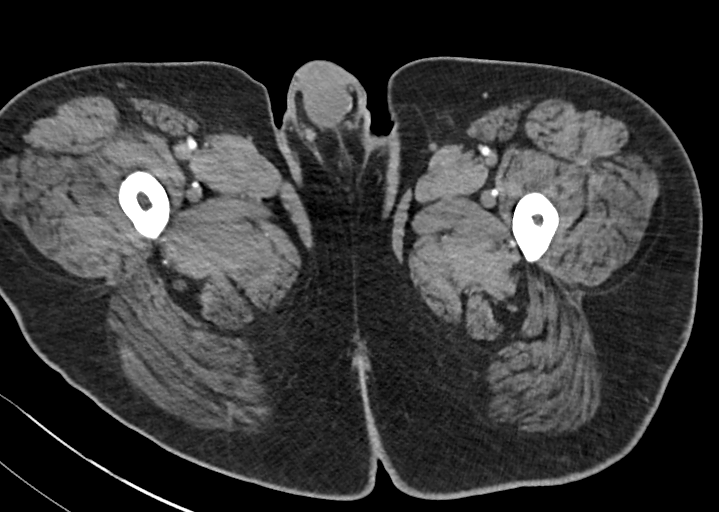
[im 4/53  bone]
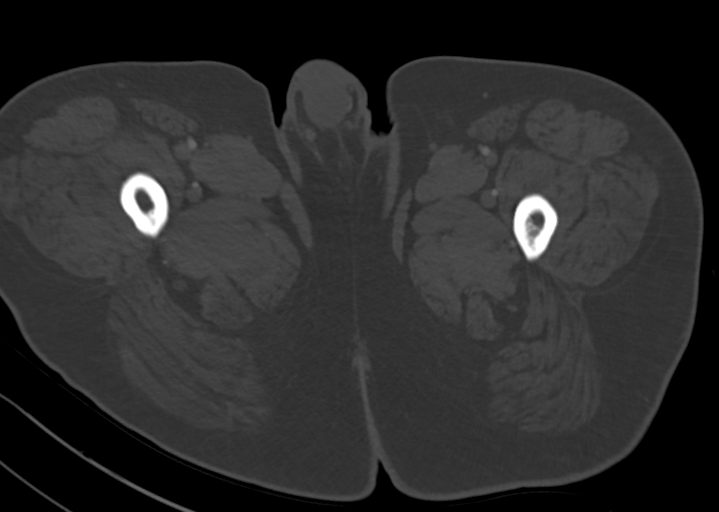
[im 11/53  soft-tissue]
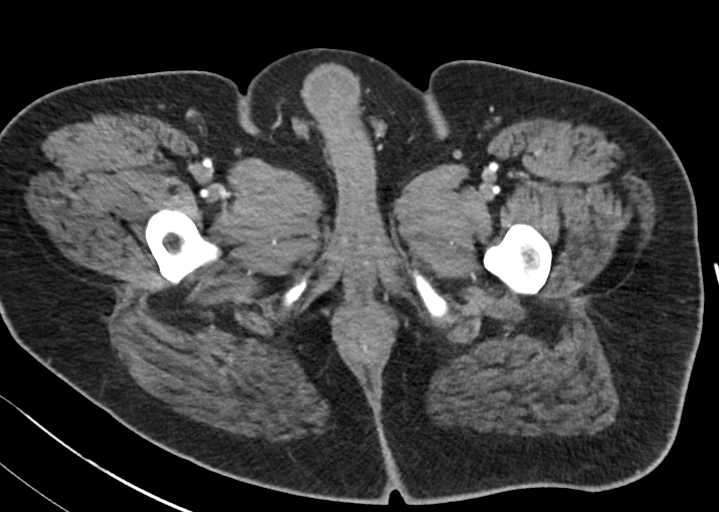
[im 16/53  soft-tissue]
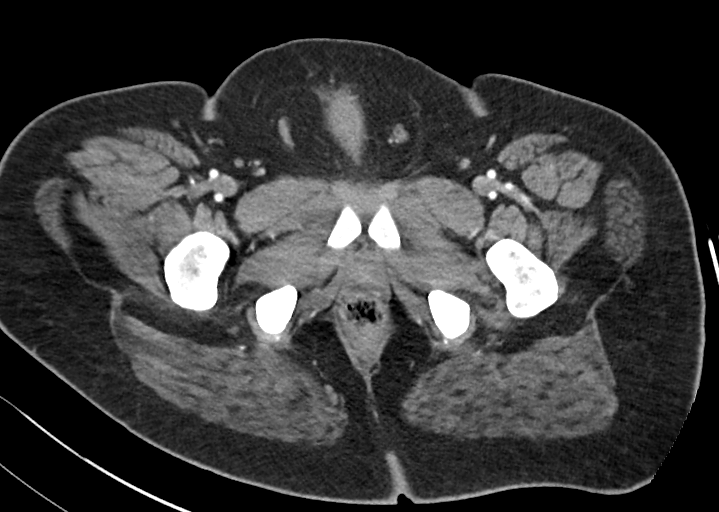
[im 21/53  soft-tissue]
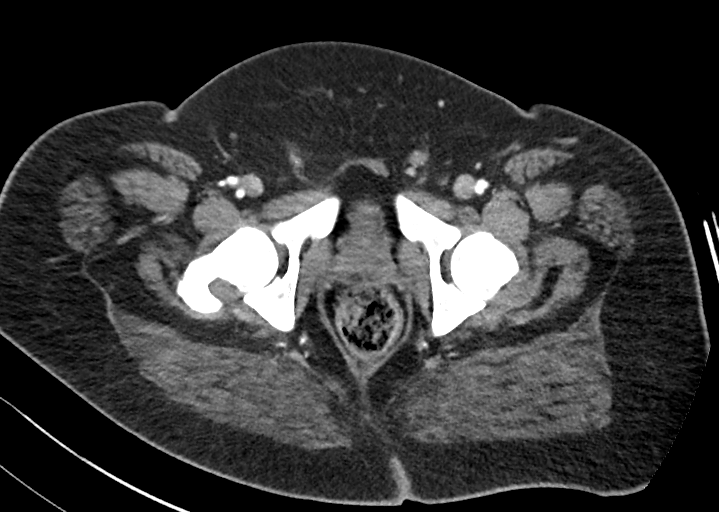
[im 27/53  soft-tissue]
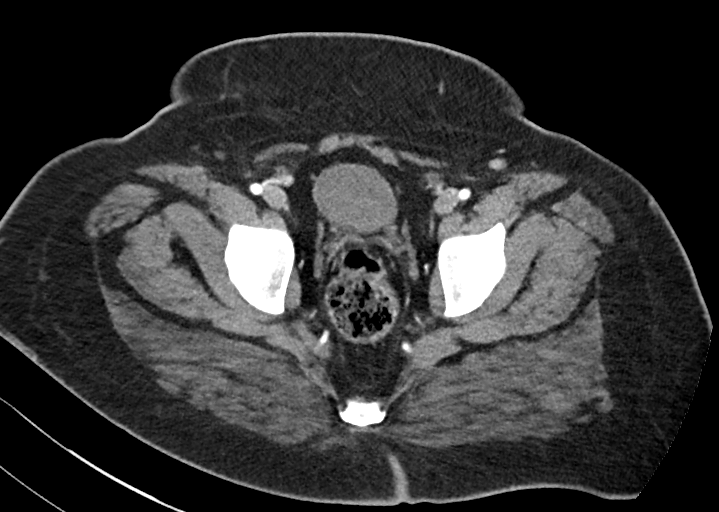
[im 32/53  soft-tissue]
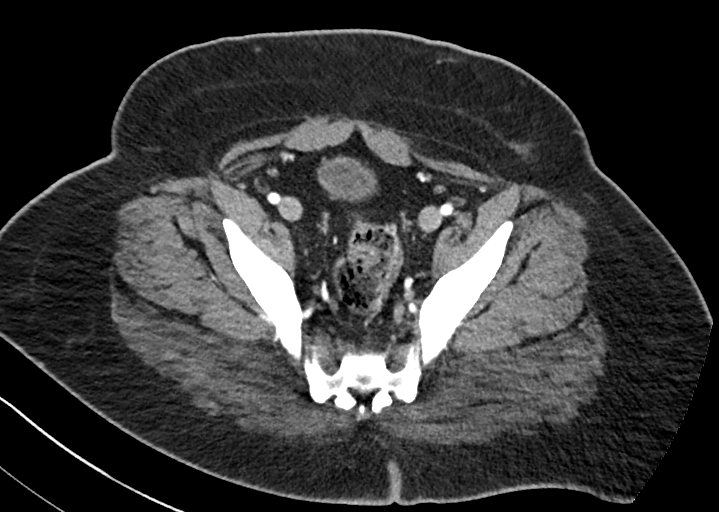
[im 37/53  soft-tissue]
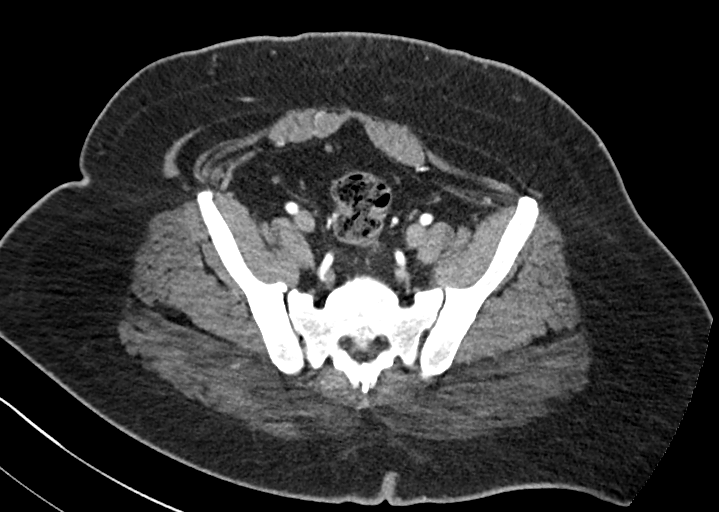
[im 44/53  soft-tissue]
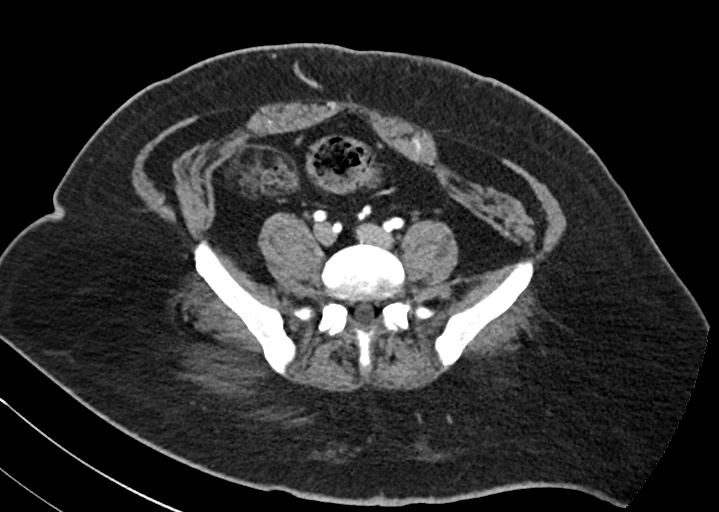
[im 49/53  soft-tissue]
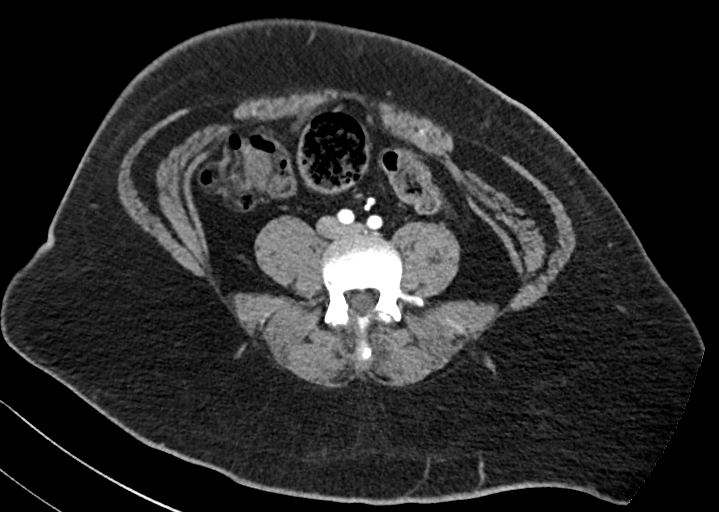
[im 49/53  bone]
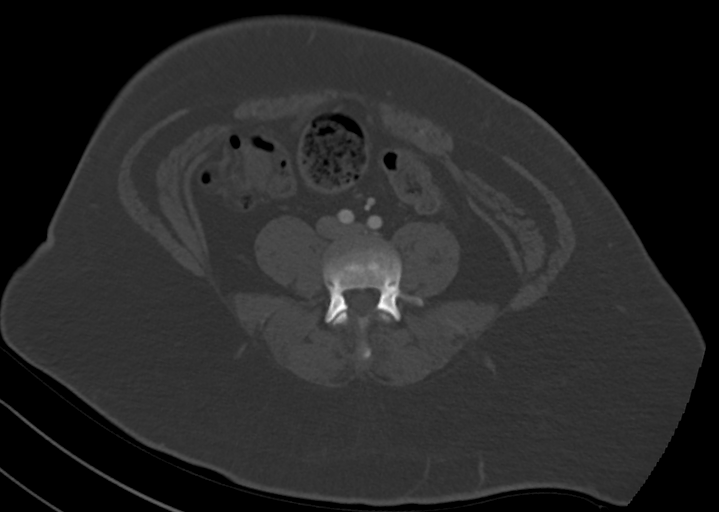

[Series 4: pelvis 2.00 br40 s3 cor · coronal · 0.52mm/px · 3 of 163 slices shown]
[im 55/163  soft-tissue]
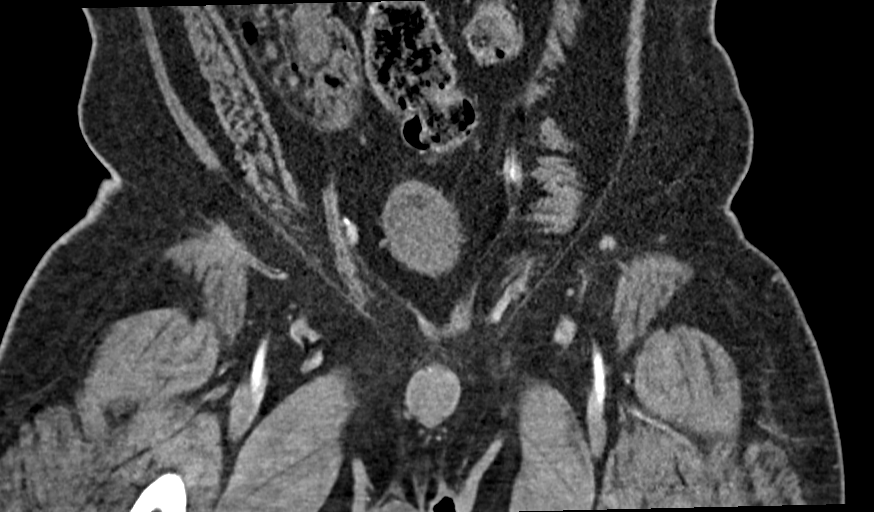
[im 73/163  soft-tissue]
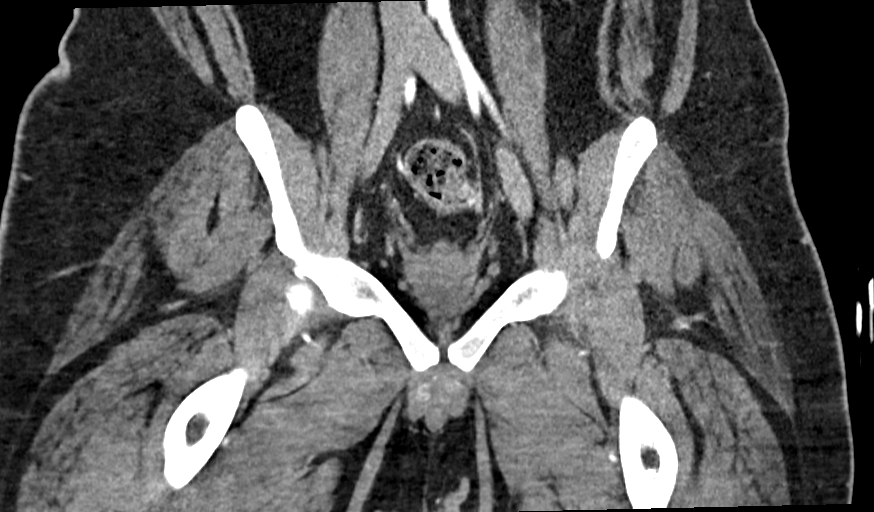
[im 91/163  soft-tissue]
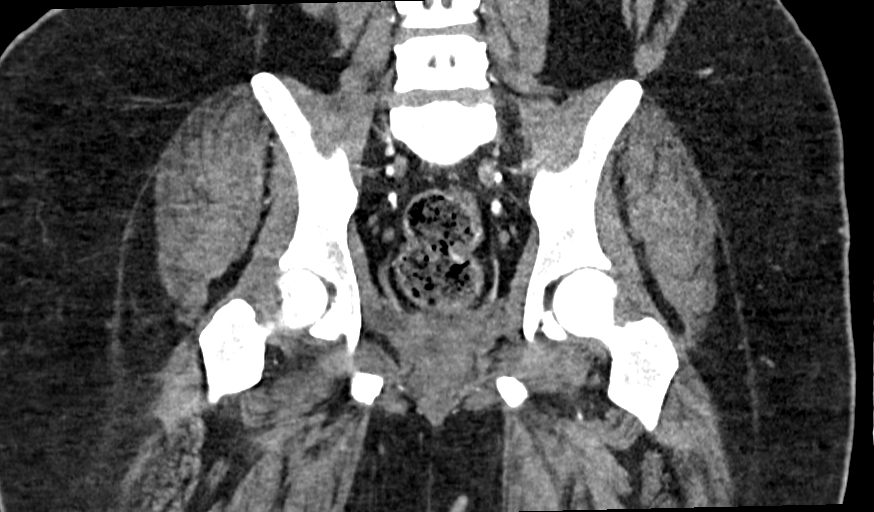

[12 of 46 positions shown; findings below may reference images not displayed]

FINDINGS: Urinary Tract: Unremarkable urinary bladder.

Bowel: Unremarkable pelvic bowel loops.

Vascular/Lymphatic: No pathologically enlarged lymph nodes or other
significant abnormality.

Reproductive:  No mass or other significant abnormality.

Other: No evidence of inguinal hernia, mass, or fluid collection.

Musculoskeletal: No significant abnormality identified.
IMPRESSION: Negative pelvis CT.  No evidence of inguinal hernia or mass.

## 2018-10-05 ENCOUNTER — Other Ambulatory Visit: Payer: Self-pay | Admitting: Internal Medicine

## 2018-10-05 NOTE — Telephone Encounter (Signed)
Copied from CRM [id number]. Topic: Quick Communication - Rx Refill/Question >> Oct 05, 2018  5:03 PM Donita Brooks wrote: Medication: escitalopram (LEXAPRO) 10 MG tablet   Has the patient contacted their pharmacy? Yes.    (Agent: If yes, when and what did the pharmacy advise?)  Preferred Pharmacy (with phone number or street name): [phone], [place] - [address] [phone] (Phone) [phone] (Fax)    Agent: Please be advised that RX refills may take up to 3 business days. We ask that you follow-up with your pharmacy.

## 2018-10-06 MED ORDER — ESCITALOPRAM OXALATE 10 MG PO TABS: 10.0000 mg | ORAL_TABLET | Freq: Every day | ORAL | 3 refills | Status: DC

## 2018-11-04 ENCOUNTER — Other Ambulatory Visit: Payer: Self-pay | Admitting: Internal Medicine

## 2018-11-11 ENCOUNTER — Telehealth (INDEPENDENT_AMBULATORY_CARE_PROVIDER_SITE_OTHER): Payer: Medicare Other | Admitting: Internal Medicine

## 2018-11-11 ENCOUNTER — Ambulatory Visit: Payer: Medicare Other | Admitting: Internal Medicine

## 2018-11-11 DIAGNOSIS — R0600 Dyspnea, unspecified: Secondary | ICD-10-CM | POA: Diagnosis not present

## 2018-11-11 DIAGNOSIS — R6250 Unspecified lack of expected normal physiological development in childhood: Secondary | ICD-10-CM

## 2018-11-11 MED ORDER — ALBUTEROL SULFATE HFA 108 (90 BASE) MCG/ACT IN AERS: 2.0000 | INHALATION_SPRAY | Freq: Four times a day (QID) | RESPIRATORY_TRACT | 11 refills | Status: DC | PRN

## 2018-11-11 MED ORDER — PREDNISONE 10 MG PO TABS: ORAL_TABLET | ORAL | 0 refills | Status: DC

## 2018-11-11 NOTE — Patient Instructions (Signed)
error 

## 2018-11-11 NOTE — Telephone Encounter (Signed)
Cumulative time during 7-day interval 12 min,, there was not an associated office visit for this concern within a 7 day period.  Verbal consent for services obtained from patient prior to services given.  Names of all persons present for services: [name], MD, mother and patient  Chief complaint: sob  History, background, results pertinent:  Here with onset 2 wks of mild sob appearing and gasping panting every few minutes without accessory muscle use, HA, fever, ST, cough and Pt denies chest pain, wheezing, orthopnea, PND, increased LE swelling, palpitations, dizziness or syncope. Does not feel overly ill.  Has hx of allergic rhinitis tx with OTC med and seems to be really active this spring. Past Medical History:  Diagnosis Date  . Development delay 09/14/2011  . Impaired glucose tolerance 09/24/2012  . Mental retardation 09/14/2011  . Obesity 09/14/2011  . Seizure disorder (HCC) 09/14/2011   Current Outpatient Medications on File Prior to Visit  Medication Sig Dispense Refill  . escitalopram (LEXAPRO) 10 MG tablet Take 1 tablet (10 mg total) by mouth daily. 90 tablet 3  . escitalopram (LEXAPRO) 20 MG tablet Take 1 tablet (20 mg total) by mouth daily. 90 tablet 3  . fluocinonide (LIDEX) 0.05 % external solution      No current facility-administered medications on file prior to visit.    A/P/next steps:  Dyspnea - presumed recurrence of seasonal asthma allergy related,   Ok for restart albuterol HFA prn, and predpac asd Mother and pt instructed to go to ED for any persistent or worsening symptoms

## 2018-11-16 ENCOUNTER — Telehealth: Payer: Self-pay | Admitting: Internal Medicine

## 2018-11-16 MED ORDER — HYDROCODONE-HOMATROPINE 5-1.5 MG/5ML PO SYRP: 5.0000 mL | ORAL_SOLUTION | Freq: Four times a day (QID) | ORAL | 0 refills | Status: AC | PRN

## 2018-11-16 NOTE — Telephone Encounter (Signed)
Done erx 

## 2018-11-16 NOTE — Addendum Note (Signed)
Addended by: Corwin Levins on: 11/16/2018 03:37 PM   Modules accepted: Orders

## 2018-11-16 NOTE — Telephone Encounter (Signed)
Copied from CRM [id number]. Topic: General - Other >> Nov 16, 2018 12:51 PM Mcneil, Ja-Kwan wrote: Reason for CRM: Pt mother stated pt was prescribed a steroid and now he has a cough and she would like to know if a medication for the cough could be sent to the pharmacy. Pt mother requests that a Rx for a cough medication [id number], [place] - [address] [phone] (Phone)  [phone] (Fax)

## 2018-12-01 ENCOUNTER — Telehealth: Payer: Self-pay

## 2018-12-01 NOTE — Telephone Encounter (Signed)
Copied from CRM [id number]. Topic: General - Other >> Nov 30, 2018  3:45 PM Jaquita Rector A wrote: Reason for CRM: Patent mom [name] called to say that she think he may have a sinus infection and need something sent to the pharmacy for him to take. Asking for a call back with questions Ph# [phone]

## 2018-12-01 NOTE — Telephone Encounter (Signed)
Left vm to call back to schedule  °

## 2018-12-01 NOTE — Telephone Encounter (Signed)
Ok for [employer] Visit at 7 pm with me If ok with mother

## 2018-12-02 ENCOUNTER — Ambulatory Visit (INDEPENDENT_AMBULATORY_CARE_PROVIDER_SITE_OTHER): Payer: Medicare Other | Admitting: Internal Medicine

## 2018-12-02 ENCOUNTER — Other Ambulatory Visit: Payer: Self-pay

## 2018-12-02 DIAGNOSIS — J019 Acute sinusitis, unspecified: Secondary | ICD-10-CM | POA: Diagnosis not present

## 2018-12-02 DIAGNOSIS — J309 Allergic rhinitis, unspecified: Secondary | ICD-10-CM | POA: Diagnosis not present

## 2018-12-02 DIAGNOSIS — R7302 Impaired glucose tolerance (oral): Secondary | ICD-10-CM | POA: Diagnosis not present

## 2018-12-02 MED ORDER — SULFAMETHOXAZOLE-TRIMETHOPRIM 800-160 MG PO TABS: 1.0000 | ORAL_TABLET | Freq: Two times a day (BID) | ORAL | 0 refills | Status: DC

## 2018-12-02 NOTE — Progress Notes (Signed)
Patient ID: Scott Curtis, male   DOB: 12/22/1990, 28 y.o.   MRN: [id number]  Virtual Visit via Video Note by phone only as video failed  I connected with Scott Curtis on 12/02/18 at  7:00 PM EDT by a video enabled telemedicine application and verified that I am speaking with the correct person using two identifiers.  Location: Patient: at home with mother present Provider: at my home   I discussed the limitations of evaluation and management by telemedicine and the availability of in person appointments. The patient expressed understanding and agreed to proceed.  History of Present Illness:  Here with 2-3 days acute onset fever, facial pain, pressure, headache, general weakness and malaise, and greenish d/c, with mild ST and cough, but pt denies chest pain, wheezing, increased sob or doe, orthopnea, PND, increased LE swelling, palpitations, dizziness or syncope.  Does have several wks ongoing nasal allergy symptoms with clearish congestion, itch and sneezing, without fever, pain, ST, cough, swelling or wheezing.   Pt denies polydipsia, polyuria Past Medical History:  Diagnosis Date  . Development delay 09/14/2011  . Impaired glucose tolerance 09/24/2012  . Mental retardation 09/14/2011  . Obesity 09/14/2011  . Seizure disorder (HCC) 09/14/2011   Past Surgical History:  Procedure Laterality Date  . NO PAST SURGERIES      reports that he has never smoked. He has never used smokeless tobacco. He reports that he does not drink alcohol or use drugs. family history includes Arthritis in an other family member; Cancer in an other family member; Hypertension in an other family member. Allergies  Allergen Reactions  . Codeine Hives  . Penicillin G Rash    Has patient had a PCN reaction causing immediate rash, facial/tongue/throat swelling, SOB or lightheadedness with hypotension: Unknown Has patient had a PCN reaction causing severe rash involving mucus membranes or skin necrosis: No Has patient  had a PCN reaction that required hospitalization: No Has patient had a PCN reaction occurring within the last 10 years: No If all of the above answers are "NO", then may proceed with Cephalosporin use.    Current Outpatient Medications on File Prior to Visit  Medication Sig Dispense Refill  . albuterol (VENTOLIN HFA) 108 (90 Base) MCG/ACT inhaler Inhale 2 puffs into the lungs every 6 (six) hours as needed for wheezing or shortness of breath. 1 Inhaler 11  . escitalopram (LEXAPRO) 10 MG tablet Take 1 tablet (10 mg total) by mouth daily. 90 tablet 3  . escitalopram (LEXAPRO) 20 MG tablet Take 1 tablet (20 mg total) by mouth daily. 90 tablet 3  . fluocinonide (LIDEX) 0.05 % external solution     . predniSONE (DELTASONE) 10 MG tablet 3 tabs by mouth per day for 3 days,2tabs per day for 3 days,1tab per day for 3 days 18 tablet 0   No current facility-administered medications on file prior to visit.     Observations/Objective: Unable due to phone only  Lab Results  Component Value Date   WBC 6.7 08/11/2017   HGB 13.9 08/11/2017   HCT 41.7 08/11/2017   PLT 241 08/11/2017   GLUCOSE 95 08/11/2017   ALT 17 08/11/2017   AST 23 08/11/2017   NA 138 08/11/2017   K 3.3 (L) 08/11/2017   CL 103 08/11/2017   CREATININE 0.66 08/11/2017   BUN 9 08/11/2017   CO2 27 08/11/2017   Assessment and Plan: See notes  Follow Up Instructions: See notes   I discussed the assessment and treatment plan  with the patient. The patient was provided an opportunity to ask questions and all were answered. The patient agreed with the plan and demonstrated an understanding of the instructions.   The patient was advised to call back or seek an in-person evaluation if the symptoms worsen or if the condition fails to improve as anticipated.   Cathlean Cower, MD

## 2018-12-02 NOTE — Patient Instructions (Signed)
Please take all new medication as prescribed - the antibiotic  Please continue all other medications as before, and refills have been done if requested.  Please have the pharmacy call with any other refills you may need.  Please continue your efforts at being more active, low cholesterol diet, and weight control.  Please keep your appointments with your specialists as you may have planned    

## 2018-12-06 ENCOUNTER — Encounter: Payer: Self-pay | Admitting: Internal Medicine

## 2018-12-06 DIAGNOSIS — J019 Acute sinusitis, unspecified: Secondary | ICD-10-CM | POA: Insufficient documentation

## 2018-12-06 NOTE — Assessment & Plan Note (Signed)
stable overall by history and exam, recent data reviewed with pt, and pt to continue medical treatment as before,  to f/u any worsening symptoms or concerns  

## 2018-12-06 NOTE — Assessment & Plan Note (Signed)
Mild to mod, for antibx course,  to f/u any worsening symptoms or concerns 

## 2018-12-06 NOTE — Assessment & Plan Note (Signed)
Improved after recent predpac, to take otc claritin prn

## 2018-12-10 ENCOUNTER — Ambulatory Visit (INDEPENDENT_AMBULATORY_CARE_PROVIDER_SITE_OTHER): Payer: Medicare Other | Admitting: Internal Medicine

## 2018-12-10 ENCOUNTER — Other Ambulatory Visit: Payer: Self-pay

## 2018-12-10 ENCOUNTER — Telehealth: Payer: Self-pay

## 2018-12-10 ENCOUNTER — Encounter: Payer: Self-pay | Admitting: Internal Medicine

## 2018-12-10 VITALS — BP 118/78 | HR 100 | Temp 98.7°F | Ht 60.0 in | Wt 262.0 lb

## 2018-12-10 DIAGNOSIS — J019 Acute sinusitis, unspecified: Secondary | ICD-10-CM

## 2018-12-10 DIAGNOSIS — R7302 Impaired glucose tolerance (oral): Secondary | ICD-10-CM

## 2018-12-10 DIAGNOSIS — J309 Allergic rhinitis, unspecified: Secondary | ICD-10-CM | POA: Diagnosis not present

## 2018-12-10 MED ORDER — AZITHROMYCIN 250 MG PO TABS: ORAL_TABLET | ORAL | 1 refills | Status: DC

## 2018-12-10 MED ORDER — GUAIFENESIN ER 600 MG PO TB12: 1200.0000 mg | ORAL_TABLET | Freq: Two times a day (BID) | ORAL | 2 refills | Status: DC | PRN

## 2018-12-10 MED ORDER — TRIAMCINOLONE ACETONIDE 55 MCG/ACT NA AERO: 2.0000 | INHALATION_SPRAY | Freq: Every day | NASAL | 12 refills | Status: DC

## 2018-12-10 MED ORDER — IPRATROPIUM BROMIDE 0.03 % NA SOLN: 2.0000 | Freq: Two times a day (BID) | NASAL | 12 refills | Status: DC

## 2018-12-10 NOTE — Telephone Encounter (Signed)
Appointment scheduled.

## 2018-12-10 NOTE — Telephone Encounter (Signed)
Copied from CRM [id number]. Topic: Appointment Scheduling - Scheduling Inquiry for Clinic >> Dec 10, 2018  7:49 AM Fanny Bien wrote: Reason for CRM: pt mother called and stated that the pt is still congested and medication is not working. Pt mother would like a call back. Pt mother is requesting an in office visit. Please advise

## 2018-12-10 NOTE — Assessment & Plan Note (Signed)
Presumed, for zpack x 1,  to f/u any worsening symptoms or concerns

## 2018-12-10 NOTE — Assessment & Plan Note (Signed)
stable overall by history and exam, recent data reviewed with pt, and pt to continue medical treatment as before,  to f/u any worsening symptoms or concerns  

## 2018-12-10 NOTE — Telephone Encounter (Signed)
Ok for in person visit.

## 2018-12-10 NOTE — Assessment & Plan Note (Signed)
Ok to add nasacort asd, and mucinex prn

## 2018-12-10 NOTE — Patient Instructions (Signed)
Please take all new medication as prescribed - the antibiotic, nasacort, and mucinex for congestion  Please continue all other medications as before, and refills have been done if requested.  Please have the pharmacy call with any other refills you may need.  Please continue your efforts at being more active, low cholesterol diet, and weight control.  Please keep your appointments with your specialists as you may have planned

## 2018-12-10 NOTE — Progress Notes (Signed)
Subjective:    Patient ID: Scott Curtis, male    DOB: Mar 31, 1991, 28 y.o.   MRN: [id number]  HPI  Here to f/u with mother, pt is difficult historian; has had marked upper resp congestion with heavy breathing and congested sounds, was worse at night but now during day as well in the past few weeks, not better with recent prednisone.  Has seen ENT in past with rx for ipratropium nasal and asks for refill.  Pt unable to respond well to questions about fever, pain, HA, neck pain, CP, sob, abd pain but denies wheezing.   Pt denies polydipsia, polyuria.  Wt conts to increase, and next pulm appt in August pushed back due to pandemic.  Mother has sleep apnea, and believes he does as well Past Medical History:  Diagnosis Date  . Development delay 09/14/2011  . Impaired glucose tolerance 09/24/2012  . Mental retardation 09/14/2011  . Obesity 09/14/2011  . Seizure disorder (HCC) 09/14/2011   Past Surgical History:  Procedure Laterality Date  . NO PAST SURGERIES      reports that he has never smoked. He has never used smokeless tobacco. He reports that he does not drink alcohol or use drugs. family history includes Arthritis in an other family member; Cancer in an other family member; Hypertension in an other family member. Allergies  Allergen Reactions  . Codeine Hives  . Penicillin G Rash    Has patient had a PCN reaction causing immediate rash, facial/tongue/throat swelling, SOB or lightheadedness with hypotension: Unknown Has patient had a PCN reaction causing severe rash involving mucus membranes or skin necrosis: No Has patient had a PCN reaction that required hospitalization: No Has patient had a PCN reaction occurring within the last 10 years: No If all of the above answers are "NO", then may proceed with Cephalosporin use.    Current Outpatient Medications on File Prior to Visit  Medication Sig Dispense Refill  . albuterol (VENTOLIN HFA) 108 (90 Base) MCG/ACT inhaler Inhale 2 puffs into  the lungs every 6 (six) hours as needed for wheezing or shortness of breath. 1 Inhaler 11  . escitalopram (LEXAPRO) 10 MG tablet Take 1 tablet (10 mg total) by mouth daily. 90 tablet 3  . escitalopram (LEXAPRO) 20 MG tablet Take 1 tablet (20 mg total) by mouth daily. 90 tablet 3  . fluocinonide (LIDEX) 0.05 % external solution      No current facility-administered medications on file prior to visit.    Review of Systems  Constitutional: Negative for other unusual diaphoresis or sweats HENT: Negative for ear discharge or swelling Eyes: Negative for other worsening visual disturbances Respiratory: Negative for stridor or other swelling  Gastrointestinal: Negative for worsening distension or other blood Genitourinary: Negative for retention or other urinary change Musculoskeletal: Negative for other MSK pain or swelling Skin: Negative for color change or other new lesions Neurological: Negative for worsening tremors and other numbness  Psychiatric/Behavioral: Negative for worsening agitation or other fatigue All other system neg per pt    Objective:   Physical Exam BP 118/78   Pulse 100   Temp 98.7 F (37.1 C) (Oral)   Ht 5' (1.524 m)   Wt 262 lb (118.8 kg)   SpO2 97%   BMI 51.17 kg/m  VS noted, non toxic Constitutional: Pt appears in NAD HENT: Head: NCAT.  Right Ear: External ear normal.  Left Ear: External ear normal.  Eyes: . Pupils are equal, round, and reactive to light. Conjunctivae and EOM  are normal Bilat tm's with mild erythema.  Max sinus areas non tender.  Pharynx with mild erythema, no exudate Nose: without d/c or deformity Neck: Neck supple. Gross normal ROM Cardiovascular: Normal rate and regular rhythm.   Pulmonary/Chest: Effort normal and breath sounds without rales or wheezing.  Neurological: Pt is alert. At baseline orientation, motor grossly intact Skin: Skin is warm. No rashes, other new lesions, no LE edema Psychiatric: Pt behavior is normal without  agitation  No other exam findings Lab Results  Component Value Date   WBC 6.7 08/11/2017   HGB 13.9 08/11/2017   HCT 41.7 08/11/2017   PLT 241 08/11/2017   GLUCOSE 95 08/11/2017   ALT 17 08/11/2017   AST 23 08/11/2017   NA 138 08/11/2017   K 3.3 (L) 08/11/2017   CL 103 08/11/2017   CREATININE 0.66 08/11/2017   BUN 9 08/11/2017   CO2 27 08/11/2017       Assessment & Plan:

## 2018-12-18 ENCOUNTER — Encounter: Payer: Self-pay | Admitting: Internal Medicine

## 2018-12-18 ENCOUNTER — Other Ambulatory Visit: Payer: Self-pay

## 2018-12-18 ENCOUNTER — Telehealth: Payer: Self-pay | Admitting: Internal Medicine

## 2018-12-18 ENCOUNTER — Ambulatory Visit (INDEPENDENT_AMBULATORY_CARE_PROVIDER_SITE_OTHER): Payer: Medicare Other | Admitting: Internal Medicine

## 2018-12-18 VITALS — BP 118/76 | HR 85 | Temp 99.2°F | Ht 60.0 in | Wt 262.0 lb

## 2018-12-18 DIAGNOSIS — H109 Unspecified conjunctivitis: Secondary | ICD-10-CM | POA: Insufficient documentation

## 2018-12-18 DIAGNOSIS — H1013 Acute atopic conjunctivitis, bilateral: Secondary | ICD-10-CM

## 2018-12-18 DIAGNOSIS — R7302 Impaired glucose tolerance (oral): Secondary | ICD-10-CM | POA: Diagnosis not present

## 2018-12-18 DIAGNOSIS — H1033 Unspecified acute conjunctivitis, bilateral: Secondary | ICD-10-CM | POA: Diagnosis not present

## 2018-12-18 MED ORDER — NEOMYCIN-POLYMYXIN-HC 3.5-10000-1 OP SUSP: 4.0000 [drp] | Freq: Four times a day (QID) | OPHTHALMIC | 0 refills | Status: AC

## 2018-12-18 MED ORDER — OLOPATADINE HCL 0.2 % OP SOLN: OPHTHALMIC | 11 refills | Status: DC

## 2018-12-18 NOTE — Assessment & Plan Note (Signed)
Mild to mod, for pataday restart, to f/u any worsening symptoms or concerns

## 2018-12-18 NOTE — Patient Instructions (Signed)
Please take all new medication as prescribed - the cortisporin eye drops  Ok to continue the zpack  Please continue all other medications as before, including the Pataday  Please have the pharmacy call with any other refills you may need.  Please continue your efforts at being more active, low cholesterol diet, and weight control  Please keep your appointments with your specialists as you may have planned

## 2018-12-18 NOTE — Assessment & Plan Note (Signed)
Mild to mod, for antibx course,  to f/u any worsening symptoms or concerns 

## 2018-12-18 NOTE — Progress Notes (Signed)
Subjective:    Patient ID: Scott Curtis, male    DOB: June 29, 1991, 28 y.o.   MRN: [id number]  HPI  Here to f/u with mother who gives hx; pt had seemed to improve with start of zpack yesterday, but today woke with bilat eye conjunctival redness, matting kind of d/c and rather large swelling of the left upper eyelid, without trauma or vision change.  Denies fever, sinus pain, ST and even the congstion so prominent lasts visit is improved.  Remains very sleepy and tired.  Does have several wks ongoing eye allergy symptoms with clearish congestion, itch and sneezing, without fever, pain, ST, cough, swelling or wheezing.  Has run out of pataday.  Pt denies chest pain, increased sob or doe, wheezing, orthopnea, PND, increased LE swelling, palpitations, dizziness or syncope.  Pt denies polydipsia, polyuria Past Medical History:  Diagnosis Date  . Development delay 09/14/2011  . Impaired glucose tolerance 09/24/2012  . Mental retardation 09/14/2011  . Obesity 09/14/2011  . Seizure disorder (HCC) 09/14/2011   Past Surgical History:  Procedure Laterality Date  . NO PAST SURGERIES      reports that he has never smoked. He has never used smokeless tobacco. He reports that he does not drink alcohol or use drugs. family history includes Arthritis in an other family member; Cancer in an other family member; Hypertension in an other family member. Allergies  Allergen Reactions  . Codeine Hives  . Penicillin G Rash    Has patient had a PCN reaction causing immediate rash, facial/tongue/throat swelling, SOB or lightheadedness with hypotension: Unknown Has patient had a PCN reaction causing severe rash involving mucus membranes or skin necrosis: No Has patient had a PCN reaction that required hospitalization: No Has patient had a PCN reaction occurring within the last 10 years: No If all of the above answers are "NO", then may proceed with Cephalosporin use.    Current Outpatient Medications on File Prior to  Visit  Medication Sig Dispense Refill  . albuterol (VENTOLIN HFA) 108 (90 Base) MCG/ACT inhaler Inhale 2 puffs into the lungs every 6 (six) hours as needed for wheezing or shortness of breath. 1 Inhaler 11  . azithromycin (ZITHROMAX) 250 MG tablet 2 tab by mouth day 1, then 1 per day 6 tablet 1  . escitalopram (LEXAPRO) 10 MG tablet Take 1 tablet (10 mg total) by mouth daily. 90 tablet 3  . escitalopram (LEXAPRO) 20 MG tablet Take 1 tablet (20 mg total) by mouth daily. 90 tablet 3  . fluocinonide (LIDEX) 0.05 % external solution     . guaiFENesin (MUCINEX) 600 MG 12 hr tablet Take 2 tablets (1,200 mg total) by mouth 2 (two) times daily as needed for to loosen phlegm. 60 tablet 2  . ipratropium (ATROVENT) 0.03 % nasal spray Place 2 sprays into both nostrils every 12 (twelve) hours. 30 mL 12  . triamcinolone (NASACORT) 55 MCG/ACT AERO nasal inhaler Place 2 sprays into the nose daily. 1 Inhaler 12   No current facility-administered medications on file prior to visit.    Review of Systems  Constitutional: Negative for other unusual diaphoresis or sweats HENT: Negative for ear discharge or swelling Eyes: Negative for other worsening visual disturbances Respiratory: Negative for stridor or other swelling  Gastrointestinal: Negative for worsening distension or other blood Genitourinary: Negative for retention or other urinary change Musculoskeletal: Negative for other MSK pain or swelling Skin: Negative for color change or other new lesions Neurological: Negative for worsening tremors and other  numbness  Psychiatric/Behavioral: Negative for worsening agitation or other fatigue All other system neg per pt    Objective:   Physical Exam BP 118/76   Pulse 85   Temp 99.2 F (37.3 C) (Oral)   Ht 5' (1.524 m)   Wt 262 lb (118.8 kg)   SpO2 97%   BMI 51.17 kg/m  VS noted,  Constitutional: Pt appears in NAD HENT: Head: NCAT.  Right Ear: External ear normal.  Left Ear: External ear normal.   Eyes: . Pupils are equal, round, and reactive to light. Conjunctivae with bilateral conjunctival erythema , and left upper eyelid tender swelling 1+,and EOM are normal Nose: without d/c or deformity Neck: Neck supple. Gross normal ROM Cardiovascular: Normal rate and regular rhythm.   Pulmonary/Chest: Effort normal and breath sounds without rales or wheezing.  Neurological: Pt is alert. At baseline orientation, motor grossly intact Skin: Skin is warm. No rashes, other new lesions, no LE edema Psychiatric: Pt behavior is normal without agitation  No other exam findings Lab Results  Component Value Date   WBC 6.7 08/11/2017   HGB 13.9 08/11/2017   HCT 41.7 08/11/2017   PLT 241 08/11/2017   GLUCOSE 95 08/11/2017   ALT 17 08/11/2017   AST 23 08/11/2017   NA 138 08/11/2017   K 3.3 (L) 08/11/2017   CL 103 08/11/2017   CREATININE 0.66 08/11/2017   BUN 9 08/11/2017   CO2 27 08/11/2017        Assessment & Plan:

## 2018-12-18 NOTE — Assessment & Plan Note (Signed)
stable overall by history and exam, recent data reviewed with pt, and pt to continue medical treatment as before,  to f/u any worsening symptoms or concerns  

## 2018-12-18 NOTE — Telephone Encounter (Signed)
Copied from CRM [id number]. Topic: Quick Communication - See Telephone Encounter >> Dec 18, 2018  6:11 PM Jens Som A wrote: CRM for notification. See Telephone encounter for: 12/18/18.  Pharmacy is calling because neomycin-polymyxin-hydrocortisone (CORTISPORIN) 3.5-10000-1 ophthalmic suspension [[id number]]  Is on back order calling to see if would like to switch the script tobramycin dexamethasone  Please advise thank you  [id number] [place], [place] - [address] [phone] (Phone) [phone] (Fax)

## 2018-12-21 ENCOUNTER — Telehealth: Payer: Self-pay | Admitting: *Deleted

## 2018-12-21 MED ORDER — TOBRAMYCIN-DEXAMETHASONE 0.3-0.1 % OP SUSP: 2.0000 [drp] | Freq: Four times a day (QID) | OPHTHALMIC | 0 refills | Status: AC

## 2018-12-21 NOTE — Telephone Encounter (Signed)
Ok this is done 

## 2018-12-21 NOTE — Telephone Encounter (Signed)
Already done earlier today

## 2018-12-21 NOTE — Telephone Encounter (Signed)
Copied from CRM [id number]. Topic: General - Other >> Dec 21, 2018  9:34 AM Percival Spanish wrote:  Mom [facility] say [facility] the below medication can not be found and Mom is asking for something else to be called in    neomycin-polymyxin-hydrocortisone (CORTISPORIN) 3.5-10000-1 ophthalmic suspension    [facility] [address]

## 2019-01-27 ENCOUNTER — Telehealth: Payer: Self-pay

## 2019-01-27 NOTE — Telephone Encounter (Signed)
Copied from [place] [id number]. Topic: General - Other >> Jan 27, 2019  8:57 AM Celene Kras A wrote: Reason for CRM: Pts mother called stating pt has been eating raw hamburger meat. She is requesting advice. Please advise. >> Jan 27, 2019  9:27 AM Para Skeans A wrote: I do not see mother on DPR.

## 2019-01-27 NOTE — Telephone Encounter (Signed)
Pt's mother has been informed and expressed understanding.  

## 2019-01-27 NOTE — Telephone Encounter (Signed)
I know of particular need for evaluation and treatment except should watch for GI symptoms such fever, n/v, abd pain and diarrhea

## 2019-02-11 ENCOUNTER — Telehealth: Payer: Self-pay | Admitting: Internal Medicine

## 2019-02-11 MED ORDER — OLOPATADINE HCL 0.2 % OP SOLN: OPHTHALMIC | 11 refills | Status: DC

## 2019-02-11 NOTE — Addendum Note (Signed)
Addended by: Biagio Borg on: 02/11/2019 04:35 PM   Modules accepted: Orders

## 2019-02-11 NOTE — Telephone Encounter (Signed)
patanol for eye allergy refilled

## 2019-02-11 NOTE — Telephone Encounter (Signed)
Pts mother called in and is requesting a refill of pts eye drops be sent in because he is beginning to have same symptoms as last time. Please advise.  [id number] [place], [place] [address]  Phone: [phone] Fax: [phone]  Not a 24 hour pharmacy; exact hours not known.

## 2019-02-22 ENCOUNTER — Encounter: Payer: Self-pay | Admitting: Internal Medicine

## 2019-02-22 ENCOUNTER — Ambulatory Visit (INDEPENDENT_AMBULATORY_CARE_PROVIDER_SITE_OTHER): Payer: Medicare Other | Admitting: Internal Medicine

## 2019-02-22 ENCOUNTER — Telehealth: Payer: Self-pay | Admitting: Internal Medicine

## 2019-02-22 ENCOUNTER — Other Ambulatory Visit: Payer: Self-pay

## 2019-02-22 DIAGNOSIS — J309 Allergic rhinitis, unspecified: Secondary | ICD-10-CM | POA: Diagnosis not present

## 2019-02-22 DIAGNOSIS — H1013 Acute atopic conjunctivitis, bilateral: Secondary | ICD-10-CM | POA: Diagnosis not present

## 2019-02-22 DIAGNOSIS — R7302 Impaired glucose tolerance (oral): Secondary | ICD-10-CM | POA: Diagnosis not present

## 2019-02-22 MED ORDER — TRIAMCINOLONE ACETONIDE 0.1 % EX CREA: 1.0000 "application " | TOPICAL_CREAM | Freq: Two times a day (BID) | CUTANEOUS | 0 refills | Status: DC

## 2019-02-22 MED ORDER — PREDNISONE 10 MG PO TABS: ORAL_TABLET | ORAL | 0 refills | Status: DC

## 2019-02-22 NOTE — Telephone Encounter (Signed)
Patients mother Scott Curtis calling because the eye drops prescribed on 02/11/19 are not helping his eyes. If anything they are worse. Please advise.

## 2019-02-22 NOTE — Assessment & Plan Note (Signed)
Mild to mod, to continue nasacort asd,  to f/u any worsening symptoms or concerns

## 2019-02-22 NOTE — Patient Instructions (Signed)
Please take all new medication as prescribed - the prednisone  Please continue all other medications as before, and refills have been done if requested.  Please have the pharmacy call with any other refills you may need.  Please continue your efforts at being more active, low cholesterol diet, and weight control.  Please keep your appointments with your specialists as you may have planned     

## 2019-02-22 NOTE — Assessment & Plan Note (Signed)
stable overall by history and exam, recent data reviewed with pt, and pt to continue medical treatment as before,  to f/u any worsening symptoms or concerns  

## 2019-02-22 NOTE — Assessment & Plan Note (Signed)
/  Mild to mod, for predpac asd,  to f/u any worsening symptoms or concerns 

## 2019-02-22 NOTE — Telephone Encounter (Signed)
Pt scheduled  

## 2019-02-22 NOTE — Progress Notes (Signed)
Patient ID: Scott Curtis, male   DOB: 10/30/1990, 28 y.o.   MRN: [id number]  Virtual Visit via Video Note  I connected with Sakib Noguez Pinegar on 02/22/19 at  7:00 PM EDT by a video enabled telemedicine application and verified that I am speaking with the correct person using two identifiers.  Location: Patient: at home Provider: at office   I discussed the limitations of evaluation and management by telemedicine and the availability of in person appointments. The patient expressed understanding and agreed to proceed.  History of Present Illness: Here to f/u with flare of allergy symptoms it seems, seen with mother who reports large flare of eye symptoms improved with prior allergy tx, but now worsening again, and she was not able to get him to take the allergy shots as recommended a few yrs ago.  Pt denies chest pain, increased sob or doe, wheezing, orthopnea, PND, increased LE swelling, palpitations, dizziness or syncope.   Pt denies polydipsia, polyuria Past Medical History:  Diagnosis Date  . Development delay 09/14/2011  . Impaired glucose tolerance 09/24/2012  . Mental retardation 09/14/2011  . Obesity 09/14/2011  . Seizure disorder ([place]) 09/14/2011   Past Surgical History:  Procedure Laterality Date  . NO PAST SURGERIES      reports that he has never smoked. He has never used smokeless tobacco. He reports that he does not drink alcohol or use drugs. family history includes Arthritis in an other family member; Cancer in an other family member; Hypertension in an other family member. Allergies  Allergen Reactions  . Codeine Hives  . Penicillin G Rash    Has patient had a PCN reaction causing immediate rash, facial/tongue/throat swelling, SOB or lightheadedness with hypotension: Unknown Has patient had a PCN reaction causing severe rash involving mucus membranes or skin necrosis: No Has patient had a PCN reaction that required hospitalization: No Has patient had a PCN reaction occurring  within the last 10 years: No If all of the above answers are "NO", then may proceed with Cephalosporin use.     Observations/Objective: Alert, NAD, appropriate mood and affect, resps normal, cn 2-12 intact, moves all 4s, no visible rash or swelling except for bilat conjunctival matting and d/c nontender with small swelling to upper and lower eyelids Lab Results  Component Value Date   WBC 6.7 08/11/2017   HGB 13.9 08/11/2017   HCT 41.7 08/11/2017   PLT 241 08/11/2017   GLUCOSE 95 08/11/2017   ALT 17 08/11/2017   AST 23 08/11/2017   NA 138 08/11/2017   K 3.3 (L) 08/11/2017   CL 103 08/11/2017   CREATININE 0.66 08/11/2017   BUN 9 08/11/2017   CO2 27 08/11/2017   Assessment and Plan: See notes  Follow Up Instructions: See notes   I discussed the assessment and treatment plan with the patient. The patient was provided an opportunity to ask questions and all were answered. The patient agreed with the plan and demonstrated an understanding of the instructions.   The patient was advised to call back or seek an in-person evaluation if the symptoms worsen or if the condition fails to improve as anticipated   Cathlean Cower, MD

## 2019-03-02 ENCOUNTER — Ambulatory Visit (INDEPENDENT_AMBULATORY_CARE_PROVIDER_SITE_OTHER): Payer: Medicare Other | Admitting: Internal Medicine

## 2019-03-02 DIAGNOSIS — R7302 Impaired glucose tolerance (oral): Secondary | ICD-10-CM

## 2019-03-02 DIAGNOSIS — H109 Unspecified conjunctivitis: Secondary | ICD-10-CM | POA: Diagnosis not present

## 2019-03-02 DIAGNOSIS — G4710 Hypersomnia, unspecified: Secondary | ICD-10-CM | POA: Diagnosis not present

## 2019-03-02 MED ORDER — LEVOFLOXACIN 500 MG PO TABS: 500.0000 mg | ORAL_TABLET | Freq: Every day | ORAL | 0 refills | Status: AC

## 2019-03-02 NOTE — Progress Notes (Signed)
Patient ID: Scott Curtis, male   DOB: 03-25-1991, 28 y.o.   MRN: [id number]  Virtual Visit via Video Note  I connected with Scott Curtis on 03/02/19 at  4:00 PM EDT by a video enabled telemedicine application and verified that I am speaking with the correct person using two identifiers.  Location: Patient: at home Provider: at office   I discussed the limitations of evaluation and management by telemedicine and the availability of in person appointments. The patient expressed understanding and agreed to proceed.  History of Present Illness: Here to f/u, unfortunately with markedly increased right eye symptoms of upper and lower eyelid redness and swelling with a sort white film over the eye, without fever, vision change, HA, ST, cough and Pt denies chest pain, increased sob or doe, wheezing, orthopnea, PND, increased LE swelling, palpitations, dizziness or syncope.  Mother with him also mentions pt now willing to consider referral for possible sleep apnea as he has abnormal breeathing at night with snoring, tired in the AM and tends to fall asleep several times during the day.  Pt denies new neurological symptoms such as new headache, or facial or extremity weakness or numbness   Pt denies polydipsia, polyuria Past Medical History:  Diagnosis Date  . Development delay 09/14/2011  . Impaired glucose tolerance 09/24/2012  . Mental retardation 09/14/2011  . Obesity 09/14/2011  . Seizure disorder (HCC) 09/14/2011   Past Surgical History:  Procedure Laterality Date  . NO PAST SURGERIES      reports that he has never smoked. He has never used smokeless tobacco. He reports that he does not drink alcohol or use drugs. family history includes Arthritis in an other family member; Cancer in an other family member; Hypertension in an other family member. Allergies  Allergen Reactions  . Codeine Hives  . Penicillin G Rash    Has patient had a PCN reaction causing immediate rash, facial/tongue/throat  swelling, SOB or lightheadedness with hypotension: Unknown Has patient had a PCN reaction causing severe rash involving mucus membranes or skin necrosis: No Has patient had a PCN reaction that required hospitalization: No Has patient had a PCN reaction occurring within the last 10 years: No If all of the above answers are "NO", then may proceed with Cephalosporin use.    Current Outpatient Medications on File Prior to Visit  Medication Sig Dispense Refill  . albuterol (VENTOLIN HFA) 108 (90 Base) MCG/ACT inhaler Inhale 2 puffs into the lungs every 6 (six) hours as needed for wheezing or shortness of breath. 1 Inhaler 11  . azithromycin (ZITHROMAX) 250 MG tablet 2 tab by mouth day 1, then 1 per day 6 tablet 1  . escitalopram (LEXAPRO) 10 MG tablet Take 1 tablet (10 mg total) by mouth daily. 90 tablet 3  . escitalopram (LEXAPRO) 20 MG tablet Take 1 tablet (20 mg total) by mouth daily. 90 tablet 3  . fluocinonide (LIDEX) 0.05 % external solution     . guaiFENesin (MUCINEX) 600 MG 12 hr tablet Take 2 tablets (1,200 mg total) by mouth 2 (two) times daily as needed for to loosen phlegm. 60 tablet 2  . ipratropium (ATROVENT) 0.03 % nasal spray Place 2 sprays into both nostrils every 12 (twelve) hours. 30 mL 12  . Olopatadine HCl 0.2 % SOLN 1 drop each eye once daily 2.5 mL 11  . predniSONE (DELTASONE) 10 MG tablet 3 tabs by mouth per day for 3 days,2tabs per day for 3 days,1tab per day for 3 days  18 tablet 0  . triamcinolone (NASACORT) 55 MCG/ACT AERO nasal inhaler Place 2 sprays into the nose daily. 1 Inhaler 12  . triamcinolone cream (KENALOG) 0.1 % Apply 1 application topically 2 (two) times daily. 30 g 0   No current facility-administered medications on file prior to visit.     Observations/Objective: Alert, NAD, appropriate mood and affect, resps normal, cn 2-12 intact, moves all 4s, no visible rash or swelling Lab Results  Component Value Date   WBC 6.7 08/11/2017   HGB 13.9 08/11/2017    HCT 41.7 08/11/2017   PLT 241 08/11/2017   GLUCOSE 95 08/11/2017   ALT 17 08/11/2017   AST 23 08/11/2017   NA 138 08/11/2017   K 3.3 (L) 08/11/2017   CL 103 08/11/2017   CREATININE 0.66 08/11/2017   BUN 9 08/11/2017   CO2 27 08/11/2017   Assessment and Plan: See notes  Follow Up Instructions: See notes   I discussed the assessment and treatment plan with the patient. The patient was provided an opportunity to ask questions and all were answered. The patient agreed with the plan and demonstrated an understanding of the instructions.   The patient was advised to call back or seek an in-person evaluation if the symptoms worsen or if the condition fails to improve as anticipated.  Cathlean Cower, MD

## 2019-03-02 NOTE — Patient Instructions (Signed)
Please take all new medication as prescribed - the antibiotic  Please continue all other medications as before, and refills have been done if requested.  Please have the pharmacy call with any other refills you may need.  Please keep your appointments with your specialists as you may have planned  You will be contacted regarding the referral for: Eye doctor, and Pulmonary

## 2019-03-05 ENCOUNTER — Encounter: Payer: Self-pay | Admitting: Internal Medicine

## 2019-03-05 DIAGNOSIS — H1013 Acute atopic conjunctivitis, bilateral: Secondary | ICD-10-CM | POA: Diagnosis not present

## 2019-03-05 NOTE — Assessment & Plan Note (Signed)
Likely OSA by hx, for pulm referral,  to f/u any worsening symptoms or concerns

## 2019-03-05 NOTE — Assessment & Plan Note (Signed)
stable overall by history and exam, recent data reviewed with pt, and pt to continue medical treatment as before,  to f/u any worsening symptoms or concerns  

## 2019-03-05 NOTE — Assessment & Plan Note (Signed)
Mild to mod, for antibx course,  to f/u any worsening symptoms or concerns, also refer to ophthalmology for recurring eye issue x 2 mo

## 2019-03-11 ENCOUNTER — Other Ambulatory Visit: Payer: Self-pay

## 2019-03-11 ENCOUNTER — Ambulatory Visit (INDEPENDENT_AMBULATORY_CARE_PROVIDER_SITE_OTHER): Payer: Medicare Other | Admitting: Internal Medicine

## 2019-03-11 ENCOUNTER — Encounter: Payer: Self-pay | Admitting: Internal Medicine

## 2019-03-11 VITALS — BP 124/80 | HR 100 | Temp 97.0°F | Ht 61.0 in | Wt 279.2 lb

## 2019-03-11 DIAGNOSIS — G4733 Obstructive sleep apnea (adult) (pediatric): Secondary | ICD-10-CM

## 2019-03-11 DIAGNOSIS — R454 Irritability and anger: Secondary | ICD-10-CM

## 2019-03-11 DIAGNOSIS — G4710 Hypersomnia, unspecified: Secondary | ICD-10-CM | POA: Diagnosis not present

## 2019-03-11 NOTE — Assessment & Plan Note (Addendum)
Onset with weight gain and loud snoring in past year strongly favors OSA rather than Narcolepsy. Mother says he won't cooperate with dentist, so he likely would need sedation for any ENT exam. She says he would not cooperate for sleep-center study. Mother likes her own CPAP, making it more likely that she can make this therapy work for him if needed.  Plan- HST

## 2019-03-11 NOTE — Progress Notes (Signed)
03/11/2019- 28 yoM never smoker for sleep evaluation, referred by Dr. Jonny RuizJohn (PCP) for hypersomnia, pt mother reports him sleeping all the time Medical problem list includes Hypersomnolence, Allergic Rhinitis, Mental Retardation ( congenital syndrome), Seizure Disorder, Chronic Otitis, Seborrheic Dermatitis,  Mother here and provides information. Scott Curtis is non-verbal. Med list includes Lexapro,  Epworth score 24 Body weight today 279 lbs Mother noted at least 20 lb weight gain over the past year, starting about when he began Lexapro which calmed him and made life better. Snoring much worse and persistent sleepiness over same time. Never ENT surgery. Snores "like a bear", with witnessed apneas. Denies cataplexy by my description. GF and Mother have OSA- she likes her CPAP.  Seizures in childhood but none in recent years.  Prior to Admission medications   Medication Sig Start Date End Date Taking? Authorizing Provider  escitalopram (LEXAPRO) 10 MG tablet Take 1 tablet (10 mg total) by mouth daily. 10/06/18  Yes Corwin LevinsJohn, James W, MD  escitalopram (LEXAPRO) 20 MG tablet Take 1 tablet (20 mg total) by mouth daily. 02/23/18  Yes Corwin LevinsJohn, James W, MD  Olopatadine HCl 0.2 % SOLN 1 drop each eye once daily 02/11/19  Yes Corwin LevinsJohn, James W, MD  albuterol (VENTOLIN HFA) 108 (90 Base) MCG/ACT inhaler Inhale 2 puffs into the lungs every 6 (six) hours as needed for wheezing or shortness of breath. Patient not taking: Reported on 03/11/2019 11/11/18   Corwin LevinsJohn, James W, MD  azithromycin [facility](ZITHROMAX) 250 MG tablet 2 tab by mouth day 1, then 1 per day Patient not taking: Reported on 03/11/2019 12/10/18   Corwin LevinsJohn, James W, MD  fluocinonide (LIDEX) 0.05 % external solution  06/17/18   [provider]  guaiFENesin (MUCINEX) 600 MG 12 hr tablet Take 2 tablets (1,200 mg total) by mouth 2 (two) times daily as needed for to loosen phlegm. Patient not taking: Reported on 03/11/2019 12/10/18   Corwin LevinsJohn, James W, MD  ipratropium (ATROVENT) 0.03  % nasal spray Place 2 sprays into both nostrils every 12 (twelve) hours. Patient not taking: Reported on 03/11/2019 12/10/18   Corwin LevinsJohn, James W, MD  levofloxacin (LEVAQUIN) 500 MG tablet Take 1 tablet (500 mg total) by mouth daily for 10 days. Patient not taking: Reported on 03/11/2019 03/02/19 03/12/19  Corwin LevinsJohn, James W, MD  predniSONE (DELTASONE) 10 MG tablet 3 tabs by mouth per day for 3 days,2tabs per day for 3 days,1tab per day for 3 days Patient not taking: Reported on 03/11/2019 02/22/19   Corwin LevinsJohn, James W, MD  triamcinolone (NASACORT) 55 MCG/ACT AERO nasal inhaler Place 2 sprays into the nose daily. Patient not taking: Reported on 03/11/2019 12/10/18   Corwin LevinsJohn, James W, MD  triamcinolone cream (KENALOG) 0.1 % Apply 1 application topically 2 (two) times daily. Patient not taking: Reported on 03/11/2019 02/22/19 02/22/20  Corwin LevinsJohn, James W, MD   Past Medical History:  Diagnosis Date  . Development delay 09/14/2011  . Impaired glucose tolerance 09/24/2012  . Mental retardation 09/14/2011  . Obesity 09/14/2011  . Seizure disorder (HCC) 09/14/2011  ' Past Surgical History:  Procedure Laterality Date  . NO PAST SURGERIES     Family History  Problem Relation Age of Onset  . Arthritis Other   . Hypertension Other   . Cancer Other        breast cancer  . Neuropathy Neg Hx    Social History   Socioeconomic History  . Marital status: Single    Spouse name: Not on file  . Number of  children: Not on file  . Years of education: Not on file  . Highest education level: Not on file  Occupational History  . Occupation: N/A  Social Needs  . Financial resource strain: Not on file  . Food insecurity    Worry: Not on file    Inability: Not on file  . Transportation needs    Medical: Not on file    Non-medical: Not on file  Tobacco Use  . Smoking status: Never Smoker  . Smokeless tobacco: Never Used  Substance and Sexual Activity  . Alcohol use: No  . Drug use: No  . Sexual activity: Not on file  Lifestyle  .  Physical activity    Days per week: Not on file    Minutes per session: Not on file  . Stress: Not on file  Relationships  . Social Musicianconnections    Talks on phone: Not on file    Gets together: Not on file    Attends religious service: Not on file    Active member of club or organization: Not on file    Attends meetings of clubs or organizations: Not on file    Relationship status: Not on file  . Intimate partner violence    Fear of current or ex partner: Not on file    Emotionally abused: Not on file    Physically abused: Not on file    Forced sexual activity: Not on file  Other Topics Concern  . Not on file  Social History Narrative   Lives at home w/ his mom   Caffeine: occasional tea or soda   ROS-see HPI   + = positive Constitutional:    weight loss, night sweats, fevers, chills, fatigue, lassitude. HEENT:    headaches, difficulty swallowing, tooth/dental problems, sore throat,       sneezing, itching, ear ache, +nasal congestion, post nasal drip, snoring CV:    chest pain, orthopnea, PND, swelling in lower extremities, anasarca,                                  dizziness, palpitations Resp:   +shortness of breath with exertion or at rest.                productive cough,   non-productive cough, coughing up of blood.              change in color of mucus.  wheezing.   Skin:    rash or lesions. GI:  No-   heartburn, indigestion, abdominal pain, nausea, vomiting, diarrhea,                 change in bowel habits, loss of appetite GU: dysuria, change in color of urine, no urgency or frequency.   flank pain. MS:   joint pain, stiffness, decreased range of motion, back pain. Neuro-     nothing unusual Psych:  change in mood or affect.  depression or +anxiety.   memory loss.  OBJ- Physical Exam General- Alert, Oriented, Affect-appropriate, Distress- none acute, + obese Skin- rash-none, lesions- none, excoriation- none Lymphadenopathy- none Head- atraumatic            Eyes-  Gross vision intact, PERRLA, conjunctivae and secretions clear            Ears- Hearing, canals-normal            Nose- Clear, no-Septal dev, mucus, polyps, erosion, perforation  Throat- Mallampati IV , mucosa clear , drainage- none, tonsils- atrophic,  + raspy upper airway sounds- not really stridor Neck- flexible , trachea midline, no stridor , thyroid nl, carotid no bruit Chest - symmetrical excursion , unlabored           Heart/CV- RRR , no murmur , no gallop  , no rub, nl s1 s2                           - JVD- none , edema- none, stasis changes- none, varices- none           Lung- clear to P&A, wheeze- none, cough- none , dullness-none, rub- none           Chest wall-  Abd-  Br/ Gen/ Rectal- Not done, not indicated Extrem- cyanosis- none, clubbing, none, atrophy- none, strength- nl Neuro- nonverbal, shook head at my requests. Opened mouth only briefly.

## 2019-03-11 NOTE — Assessment & Plan Note (Signed)
Mother indicates this is much better with Lexapro.

## 2019-03-11 NOTE — Patient Instructions (Signed)
Order- please schedule unattended home sleep test    Dx OSA  Please call us about 2 weeks after his sleep study to see if results and recommendations are ready yet. If appropriate, we may be able to start treatment before we see you next.

## 2019-03-31 ENCOUNTER — Ambulatory Visit (INDEPENDENT_AMBULATORY_CARE_PROVIDER_SITE_OTHER): Payer: Medicare Other | Admitting: Internal Medicine

## 2019-03-31 ENCOUNTER — Encounter: Payer: Self-pay | Admitting: Internal Medicine

## 2019-03-31 ENCOUNTER — Other Ambulatory Visit: Payer: Self-pay

## 2019-03-31 ENCOUNTER — Ambulatory Visit (INDEPENDENT_AMBULATORY_CARE_PROVIDER_SITE_OTHER)
Admission: RE | Admit: 2019-03-31 | Discharge: 2019-03-31 | Disposition: A | Discharge status: Home / Self Care | Payer: Medicare Other | Source: Ambulatory Visit | Attending: Internal Medicine | Admitting: Internal Medicine

## 2019-03-31 ENCOUNTER — Other Ambulatory Visit: Payer: Self-pay | Admitting: Internal Medicine

## 2019-03-31 VITALS — BP 124/84 | HR 124 | Temp 97.9°F | Ht 61.0 in | Wt 280.0 lb

## 2019-03-31 DIAGNOSIS — H1013 Acute atopic conjunctivitis, bilateral: Secondary | ICD-10-CM

## 2019-03-31 DIAGNOSIS — J309 Allergic rhinitis, unspecified: Secondary | ICD-10-CM

## 2019-03-31 DIAGNOSIS — R05 Cough: Secondary | ICD-10-CM

## 2019-03-31 DIAGNOSIS — R059 Cough, unspecified: Secondary | ICD-10-CM | POA: Insufficient documentation

## 2019-03-31 MED ORDER — PREDNISONE 10 MG PO TABS: ORAL_TABLET | ORAL | 0 refills | Status: DC

## 2019-03-31 MED ORDER — LEVOFLOXACIN 500 MG PO TABS: 500.0000 mg | ORAL_TABLET | Freq: Every day | ORAL | 0 refills | Status: AC

## 2019-03-31 MED ORDER — CITALOPRAM HYDROBROMIDE 10 MG PO TABS: 10.0000 mg | ORAL_TABLET | Freq: Every day | ORAL | 3 refills | Status: DC

## 2019-03-31 MED ORDER — AZELASTINE HCL 0.05 % OP SOLN: 2.0000 [drp] | Freq: Two times a day (BID) | OPHTHALMIC | 12 refills | Status: DC

## 2019-03-31 NOTE — Assessment & Plan Note (Signed)
Ok to add optivar asd,  to f/u any worsening symptoms or concerns, for short course prednisone as well

## 2019-03-31 NOTE — Assessment & Plan Note (Addendum)
Cont allegra prn

## 2019-03-31 NOTE — Patient Instructions (Signed)
Ok to try the OTC benadryl cream for swelling  Please take all new medication as prescribed - the optivar, and the short course prednisone  Please continue all other medications as before, and refills have been done if requested.  Please have the pharmacy call with any other refills you may need.  Please continue your efforts at being more active, low cholesterol diet, and weight control.  Please keep your appointments with your specialists as you may have planned  Please go to the XRAY Department in the Basement (go straight as you get off the elevator) for the x-ray testing  You will be contacted by phone if any changes need to be made immediately.  Otherwise, you will receive a letter about your results with an explanation, but please check with MyChart first.  Please remember to sign up for MyChart if you have not done so, as this will be important to you in the future with finding out test results, communicating by private email, and scheduling acute appointments online when needed.

## 2019-03-31 NOTE — Progress Notes (Signed)
Subjective:    Patient ID: Scott Curtis, male    DOB: 12/04/1990, 28 y.o.   MRN: [id number]  HPI  Here with mother who gives hx, pt with recurring eye and eyelid symptoms dx as allergic per ENT, has also seen pulmonary, today with 3 days onset marked left upper eyelid swelling nontender, associated with significant congestion, sleeping much more in the day,and sometimes chokes on congestion it seems.  Has overall gained 20 more lbs and seems more sleepy with this. Wt Readings from Last 3 Encounters:  03/31/19 280 lb (127 kg)  03/11/19 279 lb 3.2 oz (126.6 kg)  12/18/18 262 lb (118.8 kg)  Denies worsening depressive symptoms, suicidal ideation, or panic; has ongoing anxiety, mother asking for increased tx.  Pt denies chest pain, increased sob or doe, wheezing, orthopnea, PND, increased LE swelling, palpitations, dizziness or syncope.   Pt denies polydipsia, polyuria Past Medical History:  Diagnosis Date  . Development delay 09/14/2011  . Impaired glucose tolerance 09/24/2012  . Mental retardation 09/14/2011  . Obesity 09/14/2011  . Seizure disorder ([place]) 09/14/2011   Past Surgical History:  Procedure Laterality Date  . NO PAST SURGERIES      reports that he has never smoked. He has never used smokeless tobacco. He reports that he does not drink alcohol or use drugs. family history includes Arthritis in an other family member; Cancer in an other family member; Hypertension in an other family member. Allergies  Allergen Reactions  . Codeine Hives  . Penicillin G Rash    Has patient had a PCN reaction causing immediate rash, facial/tongue/throat swelling, SOB or lightheadedness with hypotension: Unknown Has patient had a PCN reaction causing severe rash involving mucus membranes or skin necrosis: No Has patient had a PCN reaction that required hospitalization: No Has patient had a PCN reaction occurring within the last 10 years: No If all of the above answers are "NO", then may proceed with  Cephalosporin use.    Current Outpatient Medications on File Prior to Visit  Medication Sig Dispense Refill  . albuterol (VENTOLIN HFA) 108 (90 Base) MCG/ACT inhaler Inhale 2 puffs into the lungs every 6 (six) hours as needed for wheezing or shortness of breath. 1 Inhaler 11  . escitalopram (LEXAPRO) 20 MG tablet Take 1 tablet (20 mg total) by mouth daily. 90 tablet 3  . fluocinonide (LIDEX) 0.05 % external solution     . ipratropium (ATROVENT) 0.03 % nasal spray Place 2 sprays into both nostrils every 12 (twelve) hours. 30 mL 12  . Olopatadine HCl 0.2 % SOLN 1 drop each eye once daily 2.5 mL 11  . triamcinolone (NASACORT) 55 MCG/ACT AERO nasal inhaler Place 2 sprays into the nose daily. 1 Inhaler 12  . triamcinolone cream (KENALOG) 0.1 % Apply 1 application topically 2 (two) times daily. 30 g 0   No current facility-administered medications on file prior to visit.    Review of Systems  Constitutional: Negative for other unusual diaphoresis or sweats HENT: Negative for ear discharge or swelling Eyes: Negative for other worsening visual disturbances Respiratory: Negative for stridor or other swelling  Gastrointestinal: Negative for worsening distension or other blood Genitourinary: Negative for retention or other urinary change Musculoskeletal: Negative for other MSK pain or swelling Skin: Negative for color change or other new lesions Neurological: Negative for worsening tremors and other numbness  Psychiatric/Behavioral: Negative for worsening agitation or other fatigue All other system neg per pt    Objective:   Physical Exam  BP 124/84   Pulse (!) 124   Temp 97.9 F (36.6 C) (Oral)   Ht 5\' 1"  (1.549 m)   Wt 280 lb (127 kg)   SpO2 93%   BMI 52.91 kg/m  supermorbid obese, keeps falling asleep VS noted,  Constitutional: Pt appears in NAD HENT: Head: NCAT.  Right Ear: External ear normal.  Left Ear: External ear normal.  Eyes: . Pupils are equal, round, and reactive to  light. Conjunctivae and EOM are normal  Left upper eyelid 2+ swelling but no redness or tender Nose: without d/c or deformity Neck: Neck supple. Gross normal ROM Cardiovascular: Normal rate and regular rhythm.   Pulmonary/Chest: Effort normal and breath sounds without rales or wheezing.  Abd:  Soft, NT, ND, + BS, no organomegaly Neurological: Pt is alert. At baseline orientation, motor grossly intact Skin: Skin is warm. No rashes, other new lesions, no LE edema Psychiatric: Pt behavior is normal without agitation , 1+ nervous No other exam findings Lab Results  Component Value Date   WBC 6.7 08/11/2017   HGB 13.9 08/11/2017   HCT 41.7 08/11/2017   PLT 241 08/11/2017   GLUCOSE 95 08/11/2017   ALT 17 08/11/2017   AST 23 08/11/2017   NA 138 08/11/2017   K 3.3 (L) 08/11/2017   CL 103 08/11/2017   CREATININE 0.66 08/11/2017   BUN 9 08/11/2017   CO2 27 08/11/2017      Assessment & Plan:

## 2019-04-01 ENCOUNTER — Telehealth: Payer: Self-pay

## 2019-04-01 NOTE — Telephone Encounter (Signed)
Scott Curtis, has been informed and expressed understanding.   She stated she would like to know if you could prescribe him something different for anxiety. She stated that she would like xanax sent if for him to take on the days that he has his "moments". She also stated that it is not a daily thing he does and would only like to give him something when needed not like what is prescribed now for him to take daily. Please advise.

## 2019-04-01 NOTE — Telephone Encounter (Signed)
-----   Message from Biagio Borg, MD sent at 03/31/2019  8:02 PM EDT ----- Left message on MyChart, pt to cont same tx except  The test results show that your current treatment is OK, except there appears to be mild pneumonia to the left lower lung.  I will send an antibiotic, and you should hear from the office as well.  Scott Curtis to please inform pt, I will do rx

## 2019-04-01 NOTE — Telephone Encounter (Signed)
I have talked with pharmacist/shiler-----it is ok per dr Jenny Reichmann to dispense levaquin with prednison as dr Jenny Reichmann sent in on 03/31/19

## 2019-04-01 NOTE — Telephone Encounter (Signed)
I would not be able to do this with a medicaiton such as xanax as he is too sleepy now and needs his sleep apnea testing and treatment to be effective before a further sedating medication can be given

## 2019-04-02 NOTE — Telephone Encounter (Signed)
Pt's mother, Fredderick Severance, has been informed.

## 2019-04-03 ENCOUNTER — Encounter: Payer: Self-pay | Admitting: Internal Medicine

## 2019-04-03 NOTE — Assessment & Plan Note (Signed)
Pt afeb, but much increased daytime somnolence with congestion, for cxr

## 2019-04-09 ENCOUNTER — Ambulatory Visit: Payer: Medicare Other | Admitting: Family

## 2019-04-09 ENCOUNTER — Other Ambulatory Visit: Payer: Self-pay

## 2019-04-09 ENCOUNTER — Ambulatory Visit: Payer: Medicare Other

## 2019-04-09 DIAGNOSIS — G4733 Obstructive sleep apnea (adult) (pediatric): Secondary | ICD-10-CM

## 2019-04-11 DIAGNOSIS — G4733 Obstructive sleep apnea (adult) (pediatric): Secondary | ICD-10-CM | POA: Diagnosis not present

## 2019-04-16 DIAGNOSIS — G4733 Obstructive sleep apnea (adult) (pediatric): Secondary | ICD-10-CM

## 2019-04-27 ENCOUNTER — Telehealth: Payer: Self-pay | Admitting: Internal Medicine

## 2019-04-27 NOTE — Telephone Encounter (Signed)
Pt mother states citalopram (CELEXA) 10 MG tablet [[id number] did not work at all, would not cooperate at all, she stopped that and put back on escitalopram (LEXAPRO) 20 MG tablet [[id number] she is giving him 10mg . You may reach out if you have questions but she says there is no choice it did not work.

## 2019-04-28 MED ORDER — ESCITALOPRAM OXALATE 20 MG PO TABS: 20.0000 mg | ORAL_TABLET | Freq: Every day | ORAL | 3 refills | Status: DC

## 2019-04-28 NOTE — Addendum Note (Signed)
Addended by: JOHN, JAMES W on: 04/28/2019 09:01 AM   Modules accepted: Orders  

## 2019-04-29 ENCOUNTER — Telehealth: Payer: Self-pay | Admitting: Internal Medicine

## 2019-04-29 DIAGNOSIS — G4733 Obstructive sleep apnea (adult) (pediatric): Secondary | ICD-10-CM

## 2019-04-29 NOTE — Telephone Encounter (Signed)
Pt mother/legal guardian calling requesting results from home sleep test 04/11/2019. CY, please advise with your results and recommendations. Thank you.

## 2019-04-29 NOTE — Telephone Encounter (Signed)
Home sleep test showed severe obstructive sleep apnea, averaging 30 apneas/ hour, with drops in blood oxygen level.  Recommend we order new DME- use the same company that provides mother's CPAP- auto 5-20, mask of choice, humidifier, supplies, AirView/ card Let DME know that Scott Curtis is "special needs" due to mental retardation, and mother may need some accomodation for this. Please make sure there is scheduled f/u in 31-90 days, per insurance regs

## 2019-04-29 NOTE — Telephone Encounter (Signed)
Called and spoke w/ Scott Curtis (pt mother/legal guardian) regarding CY's results and recommendations. [name] verbalized understanding and agreed to these measures. Order for new DME- use the same company that provides mother's CPAP- auto 5-20, mask of choice, humidifier, supplies, AirView/ card has been placed. Pt has f/u appt in December per insurance requirements. Nothing further needed at this time.

## 2019-05-08 ENCOUNTER — Other Ambulatory Visit: Payer: Self-pay | Admitting: Internal Medicine

## 2019-05-17 DIAGNOSIS — G4733 Obstructive sleep apnea (adult) (pediatric): Secondary | ICD-10-CM | POA: Diagnosis not present

## 2019-05-28 ENCOUNTER — Other Ambulatory Visit: Payer: Self-pay | Admitting: Internal Medicine

## 2019-05-28 ENCOUNTER — Telehealth: Payer: Self-pay | Admitting: Internal Medicine

## 2019-05-28 MED ORDER — ESCITALOPRAM OXALATE 20 MG PO TABS: 20.0000 mg | ORAL_TABLET | Freq: Every day | ORAL | 1 refills | Status: DC

## 2019-05-28 NOTE — Telephone Encounter (Signed)
Copied from [place] [id number]. Topic: Quick Communication - Rx Refill/Question >> May 28, 2019 11:04 AM Leward Quan A wrote: Medication: escitalopram (LEXAPRO) 10 MG tablet   Per mother the 20 MG are too strong   Has the patient contacted their pharmacy? Yes.   (Agent: If no, request that the patient contact the pharmacy for the refill.) (Agent: If yes, when and what did the pharmacy advise?)  Preferred Pharmacy (with phone number or street name): [id number] [address], [place] [phone] (Phone) [phone] (Fax    Agent: Please be advised that RX refills may take up to 3 business days. We ask that you follow-up with your pharmacy.

## 2019-05-28 NOTE — Telephone Encounter (Signed)
Requested medication (s) are due for refill today: yes  Requested medication (s) are on the active medication list: yes  Last refill:  04/28/2019  Future visit scheduled: no  Notes to clinic: Per mother the 20 MG are too strong and would like the 10 mg    Requested Prescriptions  Pending Prescriptions Disp Refills   escitalopram (LEXAPRO) 20 MG tablet 90 tablet 3    Sig: Take 1 tablet (20 mg total) by mouth daily.     Psychiatry:  Antidepressants - SSRI Failed - 05/28/2019 11:17 AM      Failed - Completed PHQ-2 or PHQ-9 in the last 360 days.      Passed - Valid encounter within last 6 months    Recent Outpatient Visits          1 month ago Allergic conjunctivitis of both eyes   [facility] -Georges Mouse, MD   2 months ago [place] John, James W, MD   3 months ago Allergic conjunctivitis of both eyes   [place] John, James W, MD   5 months ago Acute bacterial conjunctivitis of both eyes   [place] John, James W, MD   5 months ago Allergic rhinitis, unspecified seasonality, unspecified trigger   [facility] -Georges Mouse, MD      Future Appointments            In 3 weeks Deneise Lever, MD [facility]

## 2019-06-14 NOTE — Telephone Encounter (Signed)
CPAP download shows we need to adjust the CPAP to get it working better.   Order- DME please change auto range to 5-15.            Please refit mask for better seal if patient can cooperate. He is special needs and mother will need support and accomodation.

## 2019-06-15 NOTE — Telephone Encounter (Signed)
ATC pt, no answer. Left message for pt to call back.  

## 2019-06-17 DIAGNOSIS — G4733 Obstructive sleep apnea (adult) (pediatric): Secondary | ICD-10-CM | POA: Diagnosis not present

## 2019-06-24 ENCOUNTER — Ambulatory Visit (INDEPENDENT_AMBULATORY_CARE_PROVIDER_SITE_OTHER): Payer: Medicare Other | Admitting: Internal Medicine

## 2019-06-24 ENCOUNTER — Other Ambulatory Visit: Payer: Self-pay

## 2019-06-24 DIAGNOSIS — G4734 Idiopathic sleep related nonobstructive alveolar hypoventilation: Secondary | ICD-10-CM

## 2019-06-24 DIAGNOSIS — G4733 Obstructive sleep apnea (adult) (pediatric): Secondary | ICD-10-CM

## 2019-06-24 NOTE — Progress Notes (Signed)
HPI M never smoker for OSA, complicated by   Hypersomnolence, Allergic Rhinitis, Mental Retardation ( congenital syndrome), Seizure Disorder, Chronic Otitis, Seborrheic Dermatitis, Asthma Mother  provides information. Scott Curtis is non-verbal. HST 04/11/2019- AHI 30.3/ hr, desaturation to 42%, average 81% (nocturnal hypoxemia)  ----------------------------------------------------------------------------------------  03/11/2019- 28 yoM never smoker for sleep evaluation, referred by Dr. Jonny Ruiz (PCP) for hypersomnia, pt mother reports him sleeping all the time Medical problem list includes Hypersomnolence, Allergic Rhinitis, Mental Retardation ( congenital syndrome), Seizure Disorder, Chronic Otitis, Seborrheic Dermatitis,  Mother here and provides information. Scott Curtis is non-verbal. Med list includes Lexapro,  Epworth score 24 Body weight today 279 lbs Mother noted at least 20 lb weight gain over the past year, starting about when he began Lexapro which calmed him and made life better. Snoring much worse and persistent sleepiness over same time. Never ENT surgery. Snores "like a bear", with witnessed apneas. Denies cataplexy by my description. GF and Mother have OSA- she likes her CPAP.  Seizures in childhood but none in recent years.  06/24/2019- Virtual Visit via Telephone Note  I connected with Scott Curtis on 06/24/19 at  2:30 PM EST by telephone and verified that I am speaking with the correct person using two identifiers.  Location: Patient: H Provider: O   I discussed the limitations, risks, security and privacy concerns of performing an evaluation and management service by telephone and the availability of in person appointments. I also discussed with the patient that there may be a patient responsible charge related to this service. The patient expressed understanding and agreed to proceed.   History of Present Illness: 46 yoM never smoker for OSA, Nocturnal Hypoxemia, complicated by   Allergic Rhinitis, Mental Retardation ( congenital syndrome), Seizure Disorder, Chronic Otitis, Seborrheic Dermatitis, Asthma Mother  provides information. Scott Curtis is non-verbal. CPAP auto 5-20/ [employer] Download compliance 83%, AHI 35/ hr Very high mask leak, so failed control is due to that. He pulls mask off in sleep. Mother says CPAP has stopped most snoring and she thinks he is sleeping very well.     Observations/Objective: HST 04/11/2019- AHI 30.3/ hr- Mostly Central with some Obstructive, desat to 42%, average 81%, body weight 279 lbs. Recommended resp stimulant and avoidance of sedatives. Download compliance 83%, AHI 35/ hr with very high leak. Pressure ranges 7.4- 12.4.   Assessment and Plan: OSA- We may be able to improve mask comfort and reduce his fiddling with it by reducing pressure. Educated mother again. >> 5-15 Nocturnal Hypoxemia- He won't tolerate in-lab study per mother.   Follow Up Instructions: 4 months   I discussed the assessment and treatment plan with the patient. The patient was provided an opportunity to ask questions and all were answered. The patient agreed with the plan and demonstrated an understanding of the instructions.   The patient was advised to call back or seek an in-person evaluation if the symptoms worsen or if the condition fails to improve as anticipated.  I provided 18 minutes of non-face-to-face time during this encounter.   Jetty Duhamel, MD     ROS-see HPI   + = positive Constitutional:    weight loss, night sweats, fevers, chills, fatigue, lassitude. HEENT:    headaches, difficulty swallowing, tooth/dental problems, sore throat,       sneezing, itching, ear ache, +nasal congestion, post nasal drip, snoring CV:    chest pain, orthopnea, PND, swelling in lower extremities, anasarca,  dizziness, palpitations Resp:   +shortness of breath with exertion or at rest.                productive cough,    non-productive cough, coughing up of blood.              change in color of mucus.  wheezing.   Skin:    rash or lesions. GI:  No-   heartburn, indigestion, abdominal pain, nausea, vomiting, diarrhea,                 change in bowel habits, loss of appetite GU: dysuria, change in color of urine, no urgency or frequency.   flank pain. MS:   joint pain, stiffness, decreased range of motion, back pain. Neuro-     nothing unusual Psych:  change in mood or affect.  depression or +anxiety.   memory loss.  OBJ- Physical Exam General- Alert, Oriented, Affect-appropriate, Distress- none acute, + obese Skin- rash-none, lesions- none, excoriation- none Lymphadenopathy- none Head- atraumatic            Eyes- Gross vision intact, PERRLA, conjunctivae and secretions clear            Ears- Hearing, canals-normal            Nose- Clear, no-Septal dev, mucus, polyps, erosion, perforation             Throat- Mallampati IV , mucosa clear , drainage- none, tonsils- atrophic,  + raspy upper airway sounds- not really stridor Neck- flexible , trachea midline, no stridor , thyroid nl, carotid no bruit Chest - symmetrical excursion , unlabored           Heart/CV- RRR , no murmur , no gallop  , no rub, nl s1 s2                           - JVD- none , edema- none, stasis changes- none, varices- none           Lung- clear to P&A, wheeze- none, cough- none , dullness-none, rub- none           Chest wall-  Abd-  Br/ Gen/ Rectal- Not done, not indicated Extrem- cyanosis- none, clubbing, none, atrophy- none, strength- nl Neuro- nonverbal, shook head at my requests. Opened mouth only briefly.

## 2019-06-24 NOTE — Patient Instructions (Addendum)
  Order- DME [employer] please change CPAP auto range to 5-15 and refit mask of choice for better seal and compliance, continue supplies, humidifier, Air/view o/Card   Please call if we can help

## 2019-07-02 DIAGNOSIS — G4733 Obstructive sleep apnea (adult) (pediatric): Secondary | ICD-10-CM | POA: Diagnosis not present

## 2019-07-11 ENCOUNTER — Encounter: Payer: Self-pay | Admitting: Internal Medicine

## 2019-07-11 DIAGNOSIS — G4734 Idiopathic sleep related nonobstructive alveolar hypoventilation: Secondary | ICD-10-CM | POA: Insufficient documentation

## 2019-07-11 NOTE — Assessment & Plan Note (Signed)
He won't tolerate in-lab study for CPAP titration. We will first work to improve CPAP function. May need letter to insurance/ DME to see about ONOX on CPAP then possibly supplemental O2, due to his mental retardation and inability to cooperate

## 2019-07-11 NOTE — Assessment & Plan Note (Signed)
Benefits - mother reports he is sleeping better and not snoring. Problem is high leak due to manipulating mask.  Plan- reduce pressure to reduce leak and increase comfort>> 5-15

## 2019-07-17 DIAGNOSIS — G4733 Obstructive sleep apnea (adult) (pediatric): Secondary | ICD-10-CM | POA: Diagnosis not present

## 2019-07-27 ENCOUNTER — Other Ambulatory Visit: Payer: Self-pay | Admitting: Internal Medicine

## 2019-07-29 ENCOUNTER — Telehealth: Payer: Self-pay

## 2019-07-29 NOTE — Telephone Encounter (Signed)
Copied from CRM [id number]. Topic: General - Other >> Jul 29, 2019  9:34 AM Daphine Deutscher D wrote: Reason for CRM: pt's mom called saying her son is having sinus infection type symptoms.  Ear pain, coughing.  She wants to know if Dr, Jonny Ruiz will send an antibiotic to the pharmacy.  [facility] [address]  CB@ [phone]

## 2019-07-29 NOTE — Telephone Encounter (Signed)
Per office policy no antibiotics are given out without OV. Due to pt sxs he must make an e-visit appt w/next available provider. Pls call top schedule.Marland KitchenRaechel Chute

## 2019-07-29 NOTE — Telephone Encounter (Signed)
That is agreed  Sorry per office policy, we normally cannot send antibiotics on request by email

## 2019-07-30 NOTE — Telephone Encounter (Signed)
LVM for mother or patient to return call to schedule.

## 2019-07-30 NOTE — Telephone Encounter (Signed)
[facility] Schedulers to call patient to schedule virtual visit.

## 2019-08-05 ENCOUNTER — Ambulatory Visit (INDEPENDENT_AMBULATORY_CARE_PROVIDER_SITE_OTHER): Payer: Medicare Other | Admitting: Internal Medicine

## 2019-08-05 ENCOUNTER — Encounter: Payer: Self-pay | Admitting: Internal Medicine

## 2019-08-05 DIAGNOSIS — J309 Allergic rhinitis, unspecified: Secondary | ICD-10-CM

## 2019-08-05 DIAGNOSIS — J069 Acute upper respiratory infection, unspecified: Secondary | ICD-10-CM

## 2019-08-05 DIAGNOSIS — R7302 Impaired glucose tolerance (oral): Secondary | ICD-10-CM

## 2019-08-05 DIAGNOSIS — G4734 Idiopathic sleep related nonobstructive alveolar hypoventilation: Secondary | ICD-10-CM | POA: Diagnosis not present

## 2019-08-05 MED ORDER — AZITHROMYCIN 250 MG PO TABS: ORAL_TABLET | ORAL | 1 refills | Status: DC

## 2019-08-05 NOTE — Assessment & Plan Note (Signed)
stable overall by history and exam, recent data reviewed with pt, and pt to continue medical treatment as before,  to f/u any worsening symptoms or concerns  

## 2019-08-05 NOTE — Progress Notes (Signed)
Patient ID: Scott Curtis, male   DOB: 1991/03/17, 28 y.o.   MRN: [id number]  Virtual Visit via Video Note  I connected with Scott Curtis on 08/05/19 at  4:00 PM EST by a video enabled telemedicine application and verified that I am speaking with the correct person using two identifiers.  Location: Patient: at home Provider: at office   I discussed the limitations of evaluation and management by telemedicine and the availability of in person appointments. The patient expressed understanding and agreed to proceed.  History of Present Illness:  Here with 2-3 days acute onset fever, facial pain, pressure, headache, general weakness and malaise, and greenish d/c, with mild ST and cough, but pt denies chest pain, wheezing, increased sob or doe, orthopnea, PND, increased LE swelling, palpitations, dizziness or syncope.  Does have several wks ongoing nasal allergy symptoms with clearish congestion, itch and sneezing, without fever, pain, ST, cough, swelling or wheezing. Has seen allergy and pulmonary with fairly good compliance with cpap.   Pt denies polydipsia, polyuria,     Past Medical History:  Diagnosis Date  . Development delay 09/14/2011  . Impaired glucose tolerance 09/24/2012  . Mental retardation 09/14/2011  . Obesity 09/14/2011  . Seizure disorder ([place]) 09/14/2011   Past Surgical History:  Procedure Laterality Date  . NO PAST SURGERIES      reports that he has never smoked. He has never used smokeless tobacco. He reports that he does not drink alcohol or use drugs. family history includes Arthritis in an other family member; Cancer in an other family member; Hypertension in an other family member. Allergies  Allergen Reactions  . Codeine Hives  . Penicillin G Rash    Has patient had a PCN reaction causing immediate rash, facial/tongue/throat swelling, SOB or lightheadedness with hypotension: Unknown Has patient had a PCN reaction causing severe rash involving mucus membranes or skin  necrosis: No Has patient had a PCN reaction that required hospitalization: No Has patient had a PCN reaction occurring within the last 10 years: No If all of the above answers are "NO", then may proceed with Cephalosporin use.    Current Outpatient Medications on File Prior to Visit  Medication Sig Dispense Refill  . albuterol (VENTOLIN HFA) 108 (90 Base) MCG/ACT inhaler Inhale 2 puffs into the lungs every 6 (six) hours as needed for wheezing or shortness of breath. 1 Inhaler 11  . azelastine (OPTIVAR) 0.05 % ophthalmic solution Place 2 drops into both eyes 2 (two) times daily. 6 mL 12  . escitalopram (LEXAPRO) 20 MG tablet Take 1 tablet (20 mg total) by mouth daily. 90 tablet 1  . fluocinonide (LIDEX) 0.05 % external solution     . ipratropium (ATROVENT) 0.03 % nasal spray Place 2 sprays into both nostrils every 12 (twelve) hours. 30 mL 12  . Olopatadine HCl 0.2 % SOLN 1 drop each eye once daily (Patient not taking: Reported on 06/24/2019) 2.5 mL 11  . predniSONE (DELTASONE) 10 MG tablet 3 tabs by mouth per day for 3 days,2tabs per day for 3 days, 1tab per day for 3 days (Patient not taking: Reported on 06/24/2019) 18 tablet 0  . triamcinolone (NASACORT) 55 MCG/ACT AERO nasal inhaler Place 2 sprays into the nose daily. (Patient not taking: Reported on 06/24/2019) 1 Inhaler 12  . triamcinolone cream (KENALOG) 0.1 % APPLY TO AFFECTED AREA TWICE A DAY 30 g 0   No current facility-administered medications on file prior to visit.    Observations/Objective: Alert, NAD,  appropriate mood and affect, resps normal, cn 2-12 intact, moves all 4s, no visible rash or swelling Lab Results  Component Value Date   WBC 6.7 08/11/2017   HGB 13.9 08/11/2017   HCT 41.7 08/11/2017   PLT 241 08/11/2017   GLUCOSE 95 08/11/2017   ALT 17 08/11/2017   AST 23 08/11/2017   NA 138 08/11/2017   K 3.3 (L) 08/11/2017   CL 103 08/11/2017   CREATININE 0.66 08/11/2017   BUN 9 08/11/2017   CO2 27 08/11/2017    Assessment and Plan: See notes  Follow Up Instructions: See notes   I discussed the assessment and treatment plan with the patient. The patient was provided an opportunity to ask questions and all were answered. The patient agreed with the plan and demonstrated an understanding of the instructions.   The patient was advised to call back or seek an in-person evaluation if the symptoms worsen or if the condition fails to improve as anticipated.   Scott Barre, MD

## 2019-08-05 NOTE — Assessment & Plan Note (Signed)
Mild, urged compliance with med

## 2019-08-05 NOTE — Assessment & Plan Note (Signed)
Mild to mod, for antibx course,  to f/u any worsening symptoms or concerns 

## 2019-08-05 NOTE — Assessment & Plan Note (Signed)
Encouraged for CPAP/o2 compliance

## 2019-08-05 NOTE — Patient Instructions (Signed)
Please take all new medication as prescribed  Please continue all other medications as before, and refills have been done if requested.  Please have the pharmacy call with any other refills you may need.  Please continue your efforts at being more active, low cholesterol diet, and weight control.  Please keep your appointments with your specialists as you may have planned    

## 2019-08-17 DIAGNOSIS — G4733 Obstructive sleep apnea (adult) (pediatric): Secondary | ICD-10-CM | POA: Diagnosis not present

## 2019-09-13 ENCOUNTER — Ambulatory Visit (INDEPENDENT_AMBULATORY_CARE_PROVIDER_SITE_OTHER): Payer: Medicare Other | Admitting: Family

## 2019-09-13 DIAGNOSIS — J019 Acute sinusitis, unspecified: Secondary | ICD-10-CM | POA: Diagnosis not present

## 2019-09-13 MED ORDER — DOXYCYCLINE HYCLATE 100 MG PO TABS: 100.0000 mg | ORAL_TABLET | Freq: Two times a day (BID) | ORAL | 0 refills | Status: DC

## 2019-09-13 NOTE — Progress Notes (Signed)
Scott Curtis is a 29 y.o. male with the following history as recorded in Epic[employer]:  Patient Active Problem List   Diagnosis Date Noted  . Nocturnal hypoxemia 07/11/2019  . Cough 03/31/2019  . Right conjunctivitis 03/02/2019  . Conjunctivitis of both eyes 12/18/2018  . Acute sinus infection 12/06/2018  . Obstructive sleep apnea 08/19/2018  . Left ear pain 04/16/2018  . Left inguinal hernia 10/16/2017  . Irritability and anger 08/13/2017  . External otitis of right ear 08/13/2017  . Lichen simplex chronicus 11/21/2016  . Seborrheic dermatitis 11/21/2016  . Elevated blood-pressure reading without diagnosis of hypertension 09/15/2016  . Scalp lesion 09/10/2016  . Acute upper respiratory infection 09/10/2016  . Bilateral impacted cerumen 08/21/2016  . Chronic mycotic otitis externa 08/21/2016  . Rash and nonspecific skin eruption 04/06/2015  . Allergic rhinitis 10/26/2014  . Impaired glucose tolerance 09/24/2012  . Acne 09/24/2012  . Allergic conjunctivitis 09/17/2011  . Seizure disorder (HCC) 09/14/2011  . Preventative health care 09/14/2011  . Development delay 09/14/2011  . Mental retardation 09/14/2011  . Obesity 09/14/2011    Current Outpatient Medications  Medication Sig Dispense Refill  . albuterol (VENTOLIN HFA) 108 (90 Base) MCG/ACT inhaler Inhale 2 puffs into the lungs every 6 (six) hours as needed for wheezing or shortness of breath. 1 Inhaler 11  . azelastine (OPTIVAR) 0.05 % ophthalmic solution Place 2 drops into both eyes 2 (two) times daily. 6 mL 12  . doxycycline (VIBRA-TABS) 100 MG tablet Take 1 tablet (100 mg total) by mouth 2 (two) times daily. 20 tablet 0  . escitalopram (LEXAPRO) 20 MG tablet Take 1 tablet (20 mg total) by mouth daily. 90 tablet 1  . fluocinonide (LIDEX) 0.05 % external solution     . ipratropium (ATROVENT) 0.03 % nasal spray Place 2 sprays into both nostrils every 12 (twelve) hours. 30 mL 12  . Olopatadine HCl 0.2 % SOLN 1 drop each eye  once daily (Patient not taking: Reported on 06/24/2019) 2.5 mL 11  . triamcinolone (NASACORT) 55 MCG/ACT AERO nasal inhaler Place 2 sprays into the nose daily. (Patient not taking: Reported on 06/24/2019) 1 Inhaler 12  . triamcinolone cream (KENALOG) 0.1 % APPLY TO AFFECTED AREA TWICE A DAY 30 g 0   No current facility-administered medications for this visit.    Allergies: Codeine and Penicillin g  Past Medical History:  Diagnosis Date  . Development delay 09/14/2011  . Impaired glucose tolerance 09/24/2012  . Mental retardation 09/14/2011  . Obesity 09/14/2011  . Seizure disorder (HCC) 09/14/2011    Past Surgical History:  Procedure Laterality Date  . NO PAST SURGERIES      Family History  Problem Relation Age of Onset  . Arthritis Other   . Hypertension Other   . Cancer Other        breast cancer  . Neuropathy Neg Hx     Social History   Tobacco Use  . Smoking status: Never Smoker  . Smokeless tobacco: Never Used  Substance Use Topics  . Alcohol use: No    Subjective:   I connected with Kelven Flater Plake on 09/13/19 at  3:20 PM EST by a telephone call and verified that I am speaking with the correct person using two identifiers. Patient's mother provides the history of as patient is an exceptional adult with developmental delay and unable to help provider history.    I discussed the limitations of evaluation and management by telemedicine and the availability of in person appointments.  The patient expressed understanding and agreed to proceed. Provider in office/ patient is at home; provider and patient's mother and patient are only 3 people on video call.   Per mother, patient was treated for acute URI in mid January with Zithromax; she does not feel that the infection has ever cleared completely; patient is having sinus pain/ pressure and per mother, his face is swollen; denies any fever, chest pain or shortness of breath; mother is suspicious that patient's adenoids are causing the  problems;   Objective:  There were no vitals filed for this visit.  Lungs: Respirations unlabored;  Neurologic: Alert and oriented; speech intact;  Assessment:  1. Acute sinusitis, recurrence not specified, unspecified location     Plan:  Rx for Doxycycline 100 mg bid x 10 days; discussed updating CXR due to persisting cough and mother defers at this time; may also need to consider having patient follow back up with his ENT if the symptoms persist; increase fluids, rest and follow-up as needed.   Time spent 12 minutes  No follow-ups on file.  No orders of the defined types were placed in this encounter.   Requested Prescriptions   Signed Prescriptions Disp Refills  . doxycycline (VIBRA-TABS) 100 MG tablet 20 tablet 0    Sig: Take 1 tablet (100 mg total) by mouth 2 (two) times daily.

## 2019-09-17 DIAGNOSIS — G4733 Obstructive sleep apnea (adult) (pediatric): Secondary | ICD-10-CM | POA: Diagnosis not present

## 2019-10-01 ENCOUNTER — Ambulatory Visit: Payer: Medicare Other

## 2019-10-01 ENCOUNTER — Telehealth: Payer: Self-pay

## 2019-10-01 NOTE — Telephone Encounter (Signed)
New message    Mother calling wanted an appt decided to ask Dr. Jonny Ruiz to call her he knows the mother/ son very well asking for Zpack for seasonal allergies.    [facility] on [address]

## 2019-10-01 NOTE — Telephone Encounter (Signed)
Unless there is fever there is not likely a need for zpack; the more likely problem is allergies especially in the past 1-2 wks many patients with same symptoms  I can refer to allergy if mother likes

## 2019-10-01 NOTE — Telephone Encounter (Signed)
Another option would be to be seen at Respiratory clinic this evening if there is space, where there are allowed to do CXR in persons with possible infectious symptoms

## 2019-10-01 NOTE — Telephone Encounter (Signed)
Spoke with Ms. Overfelt today and she has opted to take him to respiratory clinic. Appointment has been scheduled for today at 6:15 and info emailed to his mom per her request.

## 2019-10-13 ENCOUNTER — Ambulatory Visit (INDEPENDENT_AMBULATORY_CARE_PROVIDER_SITE_OTHER): Payer: Medicare Other | Admitting: Family Medicine

## 2019-10-13 VITALS — BP 124/90 | HR 89 | Temp 98.3°F | Ht 61.0 in | Wt 290.0 lb

## 2019-10-13 DIAGNOSIS — R635 Abnormal weight gain: Secondary | ICD-10-CM | POA: Diagnosis not present

## 2019-10-13 DIAGNOSIS — R0689 Other abnormalities of breathing: Secondary | ICD-10-CM

## 2019-10-13 DIAGNOSIS — T50905A Adverse effect of unspecified drugs, medicaments and biological substances, initial encounter: Secondary | ICD-10-CM | POA: Diagnosis not present

## 2019-10-13 DIAGNOSIS — R0981 Nasal congestion: Secondary | ICD-10-CM | POA: Diagnosis not present

## 2019-10-13 MED ORDER — AZITHROMYCIN 250 MG PO TABS: ORAL_TABLET | ORAL | 0 refills | Status: DC

## 2019-10-13 MED ORDER — MONTELUKAST SODIUM 10 MG PO TABS: 10.0000 mg | ORAL_TABLET | Freq: Every day | ORAL | 3 refills | Status: DC

## 2019-10-13 NOTE — Patient Instructions (Signed)
Start Singulair 10 mg at bedtime and you can continue Claritin for sinus problems.  Start if no improvement within days- Azithromycin Take 2 tabs x 1 dose, then 1 tab every day for x 4 days. Continue inhaler 2 puffs every- 4 -6 hour as needed with chamber for heavy breathing and or shortness of breath. Follow-up ENT.    Sinusitis, Adult Sinusitis is soreness and swelling (inflammation) of your sinuses. Sinuses are hollow spaces in the bones around your face. They are located:  Around your eyes.  In the middle of your forehead.  Behind your nose.  In your cheekbones. Your sinuses and nasal passages are lined with a fluid called mucus. Mucus drains out of your sinuses. Swelling can trap mucus in your sinuses. This lets germs (bacteria, virus, or fungus) grow, which leads to infection. Most of the time, this condition is caused by a virus. What are the causes? This condition is caused by:  Allergies.  Asthma.  Germs.  Things that block your nose or sinuses.  Growths in the nose (nasal polyps).  Chemicals or irritants in the air.  Fungus (rare). What increases the risk? You are more likely to develop this condition if:  You have a weak body defense system (immune system).  You do a lot of swimming or diving.  You use nasal sprays too much.  You smoke. What are the signs or symptoms? The main symptoms of this condition are pain and a feeling of pressure around the sinuses. Other symptoms include:  Stuffy nose (congestion).  Runny nose (drainage).  Swelling and warmth in the sinuses.  Headache.  Toothache.  A cough that may get worse at night.  Mucus that collects in the throat or the back of the nose (postnasal drip).  Being unable to smell and taste.  Being very tired (fatigue).  A fever.  Sore throat.  Bad breath. How is this diagnosed? This condition is diagnosed based on:  Your symptoms.  Your medical history.  A physical exam.  Tests to  find out if your condition is short-term (acute) or long-term (chronic). Your doctor may: ? Check your nose for growths (polyps). ? Check your sinuses using a tool that has a light (endoscope). ? Check for allergies or germs. ? Do imaging tests, such as an MRI or CT scan. How is this treated? Treatment for this condition depends on the cause and whether it is short-term or long-term.  If caused by a virus, your symptoms should go away on their own within 10 days. You may be given medicines to relieve symptoms. They include: ? Medicines that shrink swollen tissue in the nose. ? Medicines that treat allergies (antihistamines). ? A spray that treats swelling of the nostrils. ? Rinses that help get rid of thick mucus in your nose (nasal saline washes).  If caused by bacteria, your doctor may wait to see if you will get better without treatment. You may be given antibiotic medicine if you have: ? A very bad infection. ? A weak body defense system.  If caused by growths in the nose, you may need to have surgery. Follow these instructions at home: Medicines  Take, use, or apply over-the-counter and prescription medicines only as told by your doctor. These may include nasal sprays.  If you were prescribed an antibiotic medicine, take it as told by your doctor. Do not stop taking the antibiotic even if you start to feel better. Hydrate and humidify   Drink enough water to keep  your pee (urine) pale yellow.  Use a cool mist humidifier to keep the humidity level in your home above 50%.  Breathe in steam for 10-15 minutes, 3-4 times a day, or as told by your doctor. You can do this in the bathroom while a hot shower is running.  Try not to spend time in cool or dry air. Rest  Rest as much as you can.  Sleep with your head raised (elevated).  Make sure you get enough sleep each night. General instructions   Put a warm, moist washcloth on your face 3-4 times a day, or as often as told  by your doctor. This will help with discomfort.  Wash your hands often with soap and water. If there is no soap and water, use hand sanitizer.  Do not smoke. Avoid being around people who are smoking (secondhand smoke).  Keep all follow-up visits as told by your doctor. This is important. Contact a doctor if:  You have a fever.  Your symptoms get worse.  Your symptoms do not get better within 10 days. Get help right away if:  You have a very bad headache.  You cannot stop throwing up (vomiting).  You have very bad pain or swelling around your face or eyes.  You have trouble seeing.  You feel confused.  Your neck is stiff.  You have trouble breathing. Summary  Sinusitis is swelling of your sinuses. Sinuses are hollow spaces in the bones around your face.  This condition is caused by tissues in your nose that become inflamed or swollen. This traps germs. These can lead to infection.  If you were prescribed an antibiotic medicine, take it as told by your doctor. Do not stop taking it even if you start to feel better.  Keep all follow-up visits as told by your doctor. This is important. This information is not intended to replace advice given to you by your health care provider. Make sure you discuss any questions you have with your health care provider. Document Revised: 12/08/2017 Document Reviewed: 12/08/2017 Elsevier Patient Education  [place].

## 2019-10-13 NOTE — Progress Notes (Signed)
Patient ID: Scott Curtis, male    DOB: 08/09/1990, 29 y.o.   MRN: [id number]  PCP: Corwin Levins, MD  No chief complaint on file.   Subjective:  HPI Scott Curtis is a 29 y.o. male presents to [facility] for evaluation of symptoms related to heavy breathing and sinus congestion. Active medical problems include OSA, morbid obesity (recent est 12 lb weight again), developmental delay.  Patient is accompanied by mother and is providing history. Patient has experience persistent sinus congestion, heavy mucous production, intermittently since early January. He was treated initially with Azithromycin and symptoms of purulent nasal drainage and congestion improved. In February, patient developed the same course of symptoms and was treated with Doxycyline. Mother reports patient's symptoms never improved. He is compliant with CPAP machine. He intermittently breaths heavy. He has a albuterol inhaler although mother has not given often due to coordinated breathing effort patient has to have to obtain medication. Patient is without a medication dispense chamber.  Review of Systems Pertinent negatives listed in HPI  Patient Active Problem List   Diagnosis Date Noted  . Nocturnal hypoxemia 07/11/2019  . Cough 03/31/2019  . Right conjunctivitis 03/02/2019  . Conjunctivitis of both eyes 12/18/2018  . Acute sinus infection 12/06/2018  . Obstructive sleep apnea 08/19/2018  . Left ear pain 04/16/2018  . Left inguinal hernia 10/16/2017  . Irritability and anger 08/13/2017  . External otitis of right ear 08/13/2017  . Lichen simplex chronicus 11/21/2016  . Seborrheic dermatitis 11/21/2016  . Elevated blood-pressure reading without diagnosis of hypertension 09/15/2016  . Scalp lesion 09/10/2016  . Acute upper respiratory infection 09/10/2016  . Bilateral impacted cerumen 08/21/2016  . Chronic mycotic otitis externa 08/21/2016  . Rash and nonspecific skin eruption 04/06/2015  .  Allergic rhinitis 10/26/2014  . Impaired glucose tolerance 09/24/2012  . Acne 09/24/2012  . Allergic conjunctivitis 09/17/2011  . Seizure disorder (HCC) 09/14/2011  . Preventative health care 09/14/2011  . Development delay 09/14/2011  . Mental retardation 09/14/2011  . Obesity 09/14/2011      Prior to Admission medications   Medication Sig Start Date End Date Taking? Authorizing Provider  albuterol (VENTOLIN HFA) 108 (90 Base) MCG/ACT inhaler Inhale 2 puffs into the lungs every 6 (six) hours as needed for wheezing or shortness of breath. 11/11/18  Yes Corwin Levins, MD  azelastine (OPTIVAR) 0.05 % ophthalmic solution Place 2 drops into both eyes 2 (two) times daily. 03/31/19  Yes Corwin Levins, MD  doxycycline (VIBRA-TABS) 100 MG tablet Take 1 tablet (100 mg total) by mouth 2 (two) times daily. 09/13/19  Yes Olive Bass, FNP  escitalopram (LEXAPRO) 20 MG tablet Take 1 tablet (20 mg total) by mouth daily. 05/28/19  Yes Corwin Levins, MD  fluocinonide (LIDEX) 0.05 % external solution  06/17/18  Yes [provider]  ipratropium (ATROVENT) 0.03 % nasal spray Place 2 sprays into both nostrils every 12 (twelve) hours. 12/10/18  Yes Corwin Levins, MD  Olopatadine HCl 0.2 % SOLN 1 drop each eye once daily 02/11/19  Yes Corwin Levins, MD  triamcinolone (NASACORT) 55 MCG/ACT AERO nasal inhaler Place 2 sprays into the nose daily. 12/10/18  Yes Corwin Levins, MD  triamcinolone cream (KENALOG) 0.1 % APPLY TO AFFECTED AREA TWICE A DAY 07/28/19  Yes Plotnikov, Georgina Quint, MD    Past Medical, Surgical Family and Social History reviewed and updated.    Objective:   Today's Vitals   10/13/19 1843  BP: 124/90  Pulse: 89  Temp: 98.3 F (36.8 C)  TempSrc: Oral  SpO2: 98%  Weight: 290 lb (131.5 kg)  Height: 5\' 1"  (1.549 m)    Wt Readings from Last 3 Encounters:  10/13/19 290 lb (131.5 kg)  03/31/19 280 lb (127 kg)  03/11/19 279 lb 3.2 oz (126.6 kg)     Physical  Exam Constitutional:      Appearance: He is obese.  HENT:     Nose: Mucosal edema and congestion present.     Comments: Purulent nasal mucus Eyes:     Extraocular Movements: Extraocular movements intact.     Pupils: Pupils are equal, round, and reactive to light.  Cardiovascular:     Rate and Rhythm: Normal rate and regular rhythm.  Pulmonary:     Effort: Pulmonary effort is normal.     Breath sounds: No wheezing, rhonchi or rales.  Chest:     Chest wall: No tenderness.  Musculoskeletal:     Cervical back: Normal range of motion.  Neurological:     Mental Status: He is alert.  Psychiatric:        Attention and Perception: Attention normal.        Mood and Affect: Mood normal.       Assessment & Plan:  1. Sinus congestion -Continue Atrovent and Flonase. -Start Singulair 10 mg at bedtime  -Azithromycin prescribe -advised to hold trial Singulair first. If no improvement in 3-5 days, ok to start Azithromycin if no improvement   2. Heavy breathing, multifactorial chronic nasal allergies and weight -Physical exam reassuring -Treat underlying cause and reemphasized importance of managing weigh -Chamber provided for use of inhaler   3. Weight gain due to medication -Due to Lexapro -Encouraged physical activity to reduce the risk of further weight gain   Meds ordered this encounter  Medications  . montelukast (SINGULAIR) 10 MG tablet    Sig: Take 1 tablet (10 mg total) by mouth at bedtime.    Dispense:  30 tablet    Refill:  3  . azithromycin (ZITHROMAX) 250 MG tablet    Sig: Take 2 tabs PO x 1 dose, then 1 tab PO QD x 4 days    Dispense:  6 tablet    Refill:  0    Ok to fill 10/13/19       -The patient was given clear instructions to go to ER or return to medical center if symptoms do not improve, worsen or new problems develop. The patient verbalized understanding.     Molli Barrows, FNP-C [facility], PRN Provider  [facility]. [place],  [place] [facility] Phone: [phone] Clinic Fax: [phone] Clinic Hours: 5:30 pm -7:30 pm (Monday-Friday)

## 2019-10-21 DIAGNOSIS — G4733 Obstructive sleep apnea (adult) (pediatric): Secondary | ICD-10-CM | POA: Diagnosis not present

## 2019-10-27 ENCOUNTER — Encounter: Payer: Self-pay | Admitting: Internal Medicine

## 2019-10-28 ENCOUNTER — Other Ambulatory Visit: Payer: Self-pay

## 2019-10-28 ENCOUNTER — Ambulatory Visit (INDEPENDENT_AMBULATORY_CARE_PROVIDER_SITE_OTHER): Payer: Medicare Other | Admitting: Internal Medicine

## 2019-10-28 ENCOUNTER — Encounter: Payer: Self-pay | Admitting: Internal Medicine

## 2019-10-28 VITALS — BP 118/76 | HR 88 | Temp 98.4°F | Ht 61.0 in | Wt 288.0 lb

## 2019-10-28 DIAGNOSIS — G4733 Obstructive sleep apnea (adult) (pediatric): Secondary | ICD-10-CM

## 2019-10-28 DIAGNOSIS — J301 Allergic rhinitis due to pollen: Secondary | ICD-10-CM

## 2019-10-28 MED ORDER — IPRATROPIUM BROMIDE 0.03 % NA SOLN: 2.0000 | Freq: Two times a day (BID) | NASAL | 12 refills | Status: DC

## 2019-10-28 NOTE — Patient Instructions (Signed)
Order- schedule CPAP mask fitting at sleep center    Dx OSA  Suggest regular use of flonase- 2 sprays each nostril every night, at least during pollen season.  Script sent for ipratropium nasal spray   You can use this and flonase, but not right at the same time, so they don't wash each other off.

## 2019-10-28 NOTE — Progress Notes (Signed)
HPI M never smoker for OSA, complicated by   Hypersomnolence, Allergic Rhinitis, Mental Retardation ( congenital syndrome), Seizure Disorder, Chronic Otitis, Seborrheic Dermatitis, Asthma Mother  provides information. Scott Curtis is non-verbal. HST 04/11/2019- AHI 30.3/ hr, desaturation to 42%, average 81% (nocturnal hypoxemia)  ----------------------------------------------------------------------------------------   06/24/2019- Virtual Visit via Telephone Note  I connected with Scott Curtis on 06/24/19 at  2:30 PM EST by telephone and verified that I am speaking with the correct person using two identifiers.  Location: Patient: H Provider: O   I discussed the limitations, risks, security and privacy concerns of performing an evaluation and management service by telephone and the availability of in person appointments. I also discussed with the patient that there may be a patient responsible charge related to this service. The patient expressed understanding and agreed to proceed.   History of Present Illness: 18 yoM never smoker for OSA, Nocturnal Hypoxemia, complicated by  Allergic Rhinitis, Mental Retardation ( congenital syndrome), Seizure Disorder, Chronic Otitis, Seborrheic Dermatitis, Asthma Mother  provides information. Scott Curtis is non-verbal. CPAP auto 5-20/ [employer] Download compliance 83%, AHI 35/ hr Very high mask leak, so failed control is due to that. He pulls mask off in sleep. Mother says CPAP has stopped most snoring and she thinks he is sleeping very well.     Observations/Objective: HST 04/11/2019- AHI 30.3/ hr- Mostly Central with some Obstructive, desat to 42%, average 81%, body weight 279 lbs. Recommended resp stimulant and avoidance of sedatives. Download compliance 83%, AHI 35/ hr with very high leak. Pressure ranges 7.4- 12.4.   Assessment and Plan: OSA- We may be able to improve mask comfort and reduce his fiddling with it by reducing pressure. Educated mother  again. >> 5-15 Nocturnal Hypoxemia- He won't tolerate in-lab study per mother.   Follow Up Instructions: 4 months   I discussed the assessment and treatment plan with the patient. The patient was provided an opportunity to ask questions and all were answered. The patient agreed with the plan and demonstrated an understanding of the instructions.   The patient was advised to call back or seek an in-person evaluation if the symptoms worsen or if the condition fails to improve as anticipated.  I provided 18 minutes of non-face-to-face time during this encounter.  Baird Lyons, MD   10/28/19- 30 yoM never smoker for OSA, Nocturnal Hypoxemia, complicated by  Allergic Rhinitis, Mental Retardation ( congenital syndrome), Seizure Disorder, Chronic Otitis, Seborrheic Dermatitis, Asthma Mother  provides information. Scott Curtis is non-verbal. CPAP auto 5-20/ [employer]      We had asked [employer] to change to 5-15, but not done. Download compliance 87%. AHI 31.8/ hr       Still very high mask leak Mother felt he would not tolerate in-lab study for mask or BIPAP titration. Has had nasal/ sinus congestion, Rx'd by PCP with Zpak then doxy. Body weight today 288 lbs  He fiddles with mask/ leaks a lot.  ROS-see HPI   + = positive Constitutional:    weight loss, night sweats, fevers, chills, fatigue, lassitude. HEENT:    headaches, difficulty swallowing, tooth/dental problems, sore throat,       sneezing, itching, ear ache, +nasal congestion, post nasal drip, snoring CV:    chest pain, orthopnea, PND, swelling in lower extremities, anasarca,                                  dizziness, palpitations Resp:   +  shortness of breath with exertion or at rest.                productive cough,   non-productive cough, coughing up of blood.              change in color of mucus.  wheezing.   Skin:    rash or lesions. GI:  No-   heartburn, indigestion, abdominal pain, nausea, vomiting, diarrhea,                 change in  bowel habits, loss of appetite GU: dysuria, change in color of urine, no urgency or frequency.   flank pain. MS:   joint pain, stiffness, decreased range of motion, back pain. Neuro-     nothing unusual Psych:  change in mood or affect.  depression or +anxiety.   memory loss.  OBJ- Physical Exam General- Alert, Oriented, Affect-appropriate, Distress- none acute, + obese Skin- rash-none, lesions- none, excoriation- none Lymphadenopathy- none Head- atraumatic            Eyes- Gross vision intact, PERRLA, conjunctivae and secretions clear            Ears- Hearing, canals-normal            Nose- Clear, no-Septal dev, mucus, polyps, erosion, perforation             Throat- Mallampati IV , mucosa clear , drainage- none, tonsils- atrophic,  Neck- flexible , trachea midline, no stridor , thyroid nl, carotid no bruit Chest - symmetrical excursion , unlabored           Heart/CV- RRR , no murmur , no gallop  , no rub, nl s1 s2                           - JVD- none , edema- none, stasis changes- none, varices- none           Lung- clear to P&A, wheeze- none, cough- none , dullness-none, rub- none           Chest wall-  Abd-  Br/ Gen/ Rectal- Not done, not indicated Extrem- cyanosis- none, clubbing, none, atrophy- none, strength- nl Neuro- nonverbal, shook head at my requests. Opened mouth only briefly.

## 2019-11-05 ENCOUNTER — Other Ambulatory Visit: Payer: Self-pay | Admitting: Internal Medicine

## 2019-11-08 ENCOUNTER — Encounter: Payer: Self-pay | Admitting: Family

## 2019-11-08 ENCOUNTER — Ambulatory Visit (INDEPENDENT_AMBULATORY_CARE_PROVIDER_SITE_OTHER): Payer: Medicare Other | Admitting: Family

## 2019-11-08 ENCOUNTER — Other Ambulatory Visit: Payer: Self-pay

## 2019-11-08 VITALS — BP 122/80 | HR 78 | Temp 98.7°F | Wt 292.2 lb

## 2019-11-08 DIAGNOSIS — H6123 Impacted cerumen, bilateral: Secondary | ICD-10-CM | POA: Diagnosis not present

## 2019-11-08 DIAGNOSIS — J309 Allergic rhinitis, unspecified: Secondary | ICD-10-CM | POA: Diagnosis not present

## 2019-11-08 MED ORDER — NEOMYCIN-POLYMYXIN-HC 3.5-10000-1 OT SOLN: 3.0000 [drp] | Freq: Three times a day (TID) | OTIC | 1 refills | Status: DC

## 2019-11-08 MED ORDER — AZELASTINE HCL 0.05 % OP SOLN: 2.0000 [drp] | Freq: Two times a day (BID) | OPHTHALMIC | 12 refills | Status: DC

## 2019-11-08 NOTE — Progress Notes (Signed)
Scott Curtis is a 29 y.o. male with the following history as recorded in EpicCare:  Patient Active Problem List   Diagnosis Date Noted  . Nocturnal hypoxemia 07/11/2019  . Cough 03/31/2019  . Right conjunctivitis 03/02/2019  . Conjunctivitis of both eyes 12/18/2018  . Acute sinus infection 12/06/2018  . Obstructive sleep apnea 08/19/2018  . Left ear pain 04/16/2018  . Left inguinal hernia 10/16/2017  . Irritability and anger 08/13/2017  . External otitis of right ear 08/13/2017  . Lichen simplex chronicus 11/21/2016  . Seborrheic dermatitis 11/21/2016  . Elevated blood-pressure reading without diagnosis of hypertension 09/15/2016  . Scalp lesion 09/10/2016  . Acute upper respiratory infection 09/10/2016  . Bilateral impacted cerumen 08/21/2016  . Chronic mycotic otitis externa 08/21/2016  . Rash and nonspecific skin eruption 04/06/2015  . Allergic rhinitis 10/26/2014  . Impaired glucose tolerance 09/24/2012  . Acne 09/24/2012  . Allergic conjunctivitis 09/17/2011  . Seizure disorder ([place]) 09/14/2011  . Preventative health care 09/14/2011  . Development delay 09/14/2011  . Mental retardation 09/14/2011  . Obesity 09/14/2011    Current Outpatient Medications  Medication Sig Dispense Refill  . albuterol (VENTOLIN HFA) 108 (90 Base) MCG/ACT inhaler Inhale 2 puffs into the lungs every 6 (six) hours as needed for wheezing or shortness of breath. 1 Inhaler 11  . azelastine (OPTIVAR) 0.05 % ophthalmic solution Place 2 drops into both eyes 2 (two) times daily. 6 mL 12  . escitalopram (LEXAPRO) 20 MG tablet Take 1 tablet (20 mg total) by mouth daily. 90 tablet 1  . fluocinonide (LIDEX) 0.05 % external solution     . fluticasone (FLONASE) 50 MCG/ACT nasal spray Place into both nostrils daily.    . Olopatadine HCl 0.2 % SOLN 1 drop each eye once daily 2.5 mL 11  . ipratropium (ATROVENT) 0.03 % nasal spray Place 2 sprays into both nostrils every 12 (twelve) hours. (Patient not taking:  Reported on 11/08/2019) 30 mL 12  . montelukast (SINGULAIR) 10 MG tablet Take 1 tablet (10 mg total) by mouth at bedtime. (Patient not taking: Reported on 11/08/2019) 30 tablet 3  . neomycin-polymyxin-hydrocortisone (CORTISPORIN) OTIC solution Place 3 drops into both ears 3 (three) times daily. 10 mL 1  . triamcinolone (NASACORT) 55 MCG/ACT AERO nasal inhaler Place 2 sprays into the nose daily. (Patient not taking: Reported on 11/08/2019) 1 Inhaler 12  . triamcinolone cream (KENALOG) 0.1 % APPLY TO AFFECTED AREA TWICE A DAY (Patient not taking: Reported on 11/08/2019) 30 g 0   No current facility-administered medications for this visit.    Allergies: Codeine and Penicillin g  Past Medical History:  Diagnosis Date  . Development delay 09/14/2011  . Impaired glucose tolerance 09/24/2012  . Mental retardation 09/14/2011  . Obesity 09/14/2011  . Seizure disorder ([employer]) 09/14/2011    Past Surgical History:  Procedure Laterality Date  . NO PAST SURGERIES      Family History  Problem Relation Age of Onset  . Arthritis Other   . Hypertension Other   . Cancer Other        breast cancer  . Neuropathy Neg Hx     Social History   Tobacco Use  . Smoking status: Never Smoker  . Smokeless tobacco: Never Used  Substance Use Topics  . Alcohol use: No    Subjective:  Accompanied by his mother; she provides majority of history; bilateral ear pain- has needed ear lavage in the past; has been using OTC Cortisporin drops with limited benefit;  Objective:  Vitals:   11/08/19 1539  BP: 122/80  Pulse: 78  Temp: 98.7 F (37.1 C)  TempSrc: Oral  SpO2: 96%  Weight: 292 lb 3.2 oz (132.5 kg)    General: Well developed, well nourished, in no acute distress  Skin : Warm and dry.  Head: Normocephalic and atraumatic  Eyes: Sclera and conjunctiva clear; pupils round and reactive to light; extraocular movements intact  Ears: External normal; after lavage, canals clear; tympanic membranes normal  Lungs:  Respirations unlabored; clear to auscultation bilaterally without wheeze, rales, rhonchi  Neurologic: Alert and oriented; speech intact; face symmetrical; moves all extremities well; CNII-XII intact without focal deficit   Assessment:  1. Bilateral impacted cerumen   2. Allergic rhinitis, unspecified seasonality, unspecified trigger     Plan:  Ear lavage completed successfully; refill on Cortisporin Otic drops- use as directed; follow up worse, no better. Refill on Optivar eye drops as requested;   No follow-ups on file.  No orders of the defined types were placed in this encounter.   Requested Prescriptions   Signed Prescriptions Disp Refills  . neomycin-polymyxin-hydrocortisone (CORTISPORIN) OTIC solution 10 mL 1    Sig: Place 3 drops into both ears 3 (three) times daily.  Marland Kitchen azelastine (OPTIVAR) 0.05 % ophthalmic solution 6 mL 12    Sig: Place 2 drops into both eyes 2 (two) times daily.

## 2019-11-15 DIAGNOSIS — G4733 Obstructive sleep apnea (adult) (pediatric): Secondary | ICD-10-CM | POA: Diagnosis not present

## 2019-11-16 NOTE — Assessment & Plan Note (Signed)
Long term goal of normalized weight could help him greatly. Consider Bariatric eval.

## 2019-11-16 NOTE — Assessment & Plan Note (Signed)
Seasonal and perennial allergic rhinitis Plan- Claritin, flonase, ipratropium nasal spray

## 2019-11-16 NOTE — Assessment & Plan Note (Signed)
Hopefully helping with nasal congestion and mask fit will improve control. Otherwise, now that he is older, we may need to discuss in-center titration study. Plan- schedule mask fitting

## 2019-12-15 DIAGNOSIS — G4733 Obstructive sleep apnea (adult) (pediatric): Secondary | ICD-10-CM | POA: Diagnosis not present

## 2019-12-29 ENCOUNTER — Encounter: Payer: Self-pay | Admitting: Family

## 2019-12-29 ENCOUNTER — Other Ambulatory Visit: Payer: Self-pay

## 2019-12-29 ENCOUNTER — Ambulatory Visit (INDEPENDENT_AMBULATORY_CARE_PROVIDER_SITE_OTHER): Payer: Medicare Other | Admitting: Family

## 2019-12-29 ENCOUNTER — Telehealth: Payer: Self-pay | Admitting: Family

## 2019-12-29 VITALS — BP 120/72 | HR 83 | Temp 98.9°F | Wt 293.0 lb

## 2019-12-29 DIAGNOSIS — R0981 Nasal congestion: Secondary | ICD-10-CM

## 2019-12-29 DIAGNOSIS — J019 Acute sinusitis, unspecified: Secondary | ICD-10-CM

## 2019-12-29 DIAGNOSIS — L280 Lichen simplex chronicus: Secondary | ICD-10-CM | POA: Diagnosis not present

## 2019-12-29 MED ORDER — FLUOCINONIDE 0.05 % EX SOLN: Freq: Two times a day (BID) | CUTANEOUS | 1 refills | Status: DC

## 2019-12-29 MED ORDER — AZITHROMYCIN 250 MG PO TABS: ORAL_TABLET | ORAL | 0 refills | Status: DC

## 2019-12-29 NOTE — Telephone Encounter (Signed)
Called pt mom verified pharmacy. Inform her Vernona Rieger sent rx to the [address]. Will resend.Marland KitchenRaechel Chute

## 2019-12-29 NOTE — Telephone Encounter (Signed)
    Pharmacy states they do not have request for medication Sent to pharmacy as: azithromycin (ZITHROMAX) 250 MG tablet   E-Prescribing Status: Receipt confirmed by pharmacy (12/29/2019 10:27 AM EDT)

## 2019-12-29 NOTE — Progress Notes (Signed)
Scott Curtis is a 29 y.o. male with the following history as recorded in EpicCare:  Patient Active Problem List   Diagnosis Date Noted  . Nocturnal hypoxemia 07/11/2019  . Cough 03/31/2019  . Right conjunctivitis 03/02/2019  . Conjunctivitis of both eyes 12/18/2018  . Acute sinus infection 12/06/2018  . Obstructive sleep apnea 08/19/2018  . Left ear pain 04/16/2018  . Left inguinal hernia 10/16/2017  . Irritability and anger 08/13/2017  . External otitis of right ear 08/13/2017  . Lichen simplex chronicus 11/21/2016  . Seborrheic dermatitis 11/21/2016  . Elevated blood-pressure reading without diagnosis of hypertension 09/15/2016  . Scalp lesion 09/10/2016  . Acute upper respiratory infection 09/10/2016  . Bilateral impacted cerumen 08/21/2016  . Chronic mycotic otitis externa 08/21/2016  . Rash and nonspecific skin eruption 04/06/2015  . Allergic rhinitis 10/26/2014  . Impaired glucose tolerance 09/24/2012  . Acne 09/24/2012  . Allergic conjunctivitis 09/17/2011  . Seizure disorder ([place]) 09/14/2011  . Preventative health care 09/14/2011  . Development delay 09/14/2011  . Mental retardation 09/14/2011  . Obesity 09/14/2011    Current Outpatient Medications  Medication Sig Dispense Refill  . albuterol (VENTOLIN HFA) 108 (90 Base) MCG/ACT inhaler Inhale 2 puffs into the lungs every 6 (six) hours as needed for wheezing or shortness of breath. 1 Inhaler 11  . azelastine (OPTIVAR) 0.05 % ophthalmic solution Place 2 drops into both eyes 2 (two) times daily. 6 mL 12  . escitalopram (LEXAPRO) 20 MG tablet Take 1 tablet (20 mg total) by mouth daily. 90 tablet 1  . fluocinonide (LIDEX) 0.05 % external solution Apply topically 2 (two) times daily. 60 mL 1  . fluticasone (FLONASE) 50 MCG/ACT nasal spray Place into both nostrils daily.    . Olopatadine HCl 0.2 % SOLN 1 drop each eye once daily 2.5 mL 11  . azithromycin (ZITHROMAX) 250 MG tablet 2 tabs po qd x 1 day; 1 tablet per day x 4  days; 6 tablet 0   No current facility-administered medications for this visit.    Allergies: Codeine and Penicillin g  Past Medical History:  Diagnosis Date  . Development delay 09/14/2011  . Impaired glucose tolerance 09/24/2012  . Mental retardation 09/14/2011  . Obesity 09/14/2011  . Seizure disorder ([place]) 09/14/2011    Past Surgical History:  Procedure Laterality Date  . NO PAST SURGERIES      Family History  Problem Relation Age of Onset  . Arthritis Other   . Hypertension Other   . Cancer Other        breast cancer  . Neuropathy Neg Hx     Social History   Tobacco Use  . Smoking status: Never Smoker  . Smokeless tobacco: Never Used  Substance Use Topics  . Alcohol use: No    Subjective:  Presents with his mother who provides majority of history; chronic nasal congestion- has been seen with this issue multiple times in the past; treated for 3 different sinus infections earlier this year; mother requesting antibiotic today; was referred to ENT previously but unable to go due to COVID restrictions; would be open to referral again;  Does wear CPAP at night- has OSA;   Also worried about chronic dry skin especially on his left knee; requesting refill on cream he has had in the past;     Objective:  Vitals:   12/29/19 1006  BP: 120/72  Pulse: 83  Temp: 98.9 F (37.2 C)  TempSrc: Oral  SpO2: 96%  Weight: 293  lb (132.9 kg)    General: Well developed, well nourished, in no acute distress  Skin : Warm and dry. Dry, scaly skin noted on left knee Head: Normocephalic and atraumatic  Eyes: Sclera and conjunctiva clear; pupils round and reactive to light; extraocular movements intact  Ears: External normal; canals clear; tympanic membranes normal  Oropharynx: Pink, supple. No suspicious lesions  Neck: Supple without thyromegaly, adenopathy  Lungs: Respirations unlabored; clear to auscultation bilaterally without wheeze, rales, rhonchi  CVS exam: normal rate and regular  rhythm.  Neurologic: Alert and oriented; speech intact; face symmetrical; moves all extremities well; CNII-XII intact without focal deficit   Assessment:  1. Chronic nasal congestion   2. Acute sinusitis, recurrence not specified, unspecified location   3. Lichen simplex chronicus     Plan:  1. Rx for Z-pak as requested; ENT referral updated; may also need to consider asthma/ allergy consult although mother does not feel patient is a candidate for immunotherapy; 3. Refill on Lidex cream as requested;  This visit occurred during the SARS-CoV-2 public health emergency.  Safety protocols were in place, including screening questions prior to the visit, additional usage of staff PPE, and extensive cleaning of exam room while observing appropriate contact time as indicated for disinfecting solutions.      No follow-ups on file.  Orders Placed This Encounter  Procedures  . Ambulatory referral to ENT    Referral Priority:   Routine    Referral Type:   Consultation    Referral Reason:   Specialty Services Required    Requested Specialty:   Otolaryngology    Number of Visits Requested:   1    Requested Prescriptions   Signed Prescriptions Disp Refills  . azithromycin (ZITHROMAX) 250 MG tablet 6 tablet 0    Sig: 2 tabs po qd x 1 day; 1 tablet per day x 4 days;  . fluocinonide (LIDEX) 0.05 % external solution 60 mL 1    Sig: Apply topically 2 (two) times daily.

## 2020-01-15 DIAGNOSIS — G4733 Obstructive sleep apnea (adult) (pediatric): Secondary | ICD-10-CM | POA: Diagnosis not present

## 2020-01-16 ENCOUNTER — Other Ambulatory Visit: Payer: Self-pay | Admitting: Internal Medicine

## 2020-01-16 NOTE — Telephone Encounter (Signed)
Please refill as per office routine med refill policy (all routine meds refilled for 3 mo or monthly per pt preference up to one year from last visit, then month to month grace period for 3 mo, then further med refills will have to be denied)  

## 2020-01-17 ENCOUNTER — Telehealth (INDEPENDENT_AMBULATORY_CARE_PROVIDER_SITE_OTHER): Payer: Medicare Other | Admitting: Family

## 2020-01-17 DIAGNOSIS — J329 Chronic sinusitis, unspecified: Secondary | ICD-10-CM | POA: Diagnosis not present

## 2020-01-17 MED ORDER — CEFDINIR 300 MG PO CAPS
300.0000 mg | ORAL_CAPSULE | Freq: Two times a day (BID) | ORAL | 0 refills | Status: DC
Start: 2020-01-17 — End: 2020-04-19

## 2020-01-17 NOTE — Progress Notes (Signed)
Scott Curtis is a 29 y.o. male with the following history as recorded in EpicCare:  Patient Active Problem List   Diagnosis Date Noted   Nocturnal hypoxemia 07/11/2019   Cough 03/31/2019   Right conjunctivitis 03/02/2019   Conjunctivitis of both eyes 12/18/2018   Acute sinus infection 12/06/2018   Obstructive sleep apnea 08/19/2018   Left ear pain 04/16/2018   Left inguinal hernia 10/16/2017   Irritability and anger 08/13/2017   External otitis of right ear 16/07/930   Lichen simplex chronicus 11/21/2016   Seborrheic dermatitis 11/21/2016   Elevated blood-pressure reading without diagnosis of hypertension 09/15/2016   Scalp lesion 09/10/2016   Acute upper respiratory infection 09/10/2016   Bilateral impacted cerumen 08/21/2016   Chronic mycotic otitis externa 08/21/2016   Rash and nonspecific skin eruption 04/06/2015   Allergic rhinitis 10/26/2014   Impaired glucose tolerance 09/24/2012   Acne 09/24/2012   Allergic conjunctivitis 09/17/2011   Seizure disorder ([place]) 09/14/2011   Preventative health care 09/14/2011   Development delay 09/14/2011   Mental retardation 09/14/2011   Obesity 09/14/2011    Current Outpatient Medications  Medication Sig Dispense Refill   albuterol (VENTOLIN HFA) 108 (90 Base) MCG/ACT inhaler TAKE 2 PUFFS BY MOUTH EVERY 6 HOURS AS NEEDED FOR WHEEZE OR SHORTNESS OF BREATH 8.5 g 3   azelastine (OPTIVAR) 0.05 % ophthalmic solution Place 2 drops into both eyes 2 (two) times daily. 6 mL 12   azithromycin (ZITHROMAX) 250 MG tablet 2 tabs po qd x 1 day; 1 tablet per day x 4 days; 6 tablet 0   cefdinir (OMNICEF) 300 MG capsule Take 1 capsule (300 mg total) by mouth 2 (two) times daily. 20 capsule 0   escitalopram (LEXAPRO) 20 MG tablet Take 1 tablet (20 mg total) by mouth daily. 90 tablet 1   fluocinonide (LIDEX) 0.05 % external solution Apply topically 2 (two) times daily. 60 mL 1   fluticasone (FLONASE) 50 MCG/ACT nasal  spray Place into both nostrils daily.     Olopatadine HCl 0.2 % SOLN 1 drop each eye once daily 2.5 mL 11   triamcinolone cream (KENALOG) 0.1 % APPLY TO AFFECTED AREA TWICE A DAY 30 g 0   No current facility-administered medications for this visit.    Allergies: Codeine and Penicillin g  Past Medical History:  Diagnosis Date   Development delay 09/14/2011   Impaired glucose tolerance 09/24/2012   Mental retardation 09/14/2011   Obesity 09/14/2011   Seizure disorder ([place]) 09/14/2011    Past Surgical History:  Procedure Laterality Date   NO PAST SURGERIES      Family History  Problem Relation Age of Onset   Arthritis Other    Hypertension Other    Cancer Other        breast cancer   Neuropathy Neg Hx     Social History   Tobacco Use   Smoking status: Never Smoker   Smokeless tobacco: Never Used  Substance Use Topics   Alcohol use: No    Subjective:   I connected with Deondrick Searls Popowski on 01/17/20 at  1:20 PM EDT by a telephone call and verified that I am speaking with the correct person using two identifiers.   I discussed the limitations of evaluation and management by telemedicine and the availability of in person appointments. The patient expressed understanding and agreed to proceed. Provider in office/ patient is at home; provider and patient's mother, patient are only 3 people on phone call. Patient's mother provides history  for patient.   Recurrent sinus infections; last seen in the office 2 weeks ago with similar complaint and given Zithromax at patient request; has been referred to ENT- scheduled to see them in July 22. Mother is asking for Cefdinir which he has used in the past with good success.   Objective:  There were no vitals filed for this visit.  Lungs: Respirations unlabored;  Neurologic: Alert and oriented; speech intact; face symmetrical;   Assessment:  1. Chronic sinusitis, unspecified location     Plan:  Rx for Omnicef 300 mg bid x 10  days; keep planned follow up with ENT; mother agrees.  Time spent 10 minutes  No follow-ups on file.  No orders of the defined types were placed in this encounter.   Requested Prescriptions   Signed Prescriptions Disp Refills   cefdinir (OMNICEF) 300 MG capsule 20 capsule 0    Sig: Take 1 capsule (300 mg total) by mouth 2 (two) times daily.

## 2020-02-08 DIAGNOSIS — J309 Allergic rhinitis, unspecified: Secondary | ICD-10-CM | POA: Diagnosis not present

## 2020-02-08 DIAGNOSIS — H6123 Impacted cerumen, bilateral: Secondary | ICD-10-CM | POA: Diagnosis not present

## 2020-02-14 DIAGNOSIS — G4733 Obstructive sleep apnea (adult) (pediatric): Secondary | ICD-10-CM | POA: Diagnosis not present

## 2020-02-24 DIAGNOSIS — G4733 Obstructive sleep apnea (adult) (pediatric): Secondary | ICD-10-CM | POA: Diagnosis not present

## 2020-03-16 DIAGNOSIS — G4733 Obstructive sleep apnea (adult) (pediatric): Secondary | ICD-10-CM | POA: Diagnosis not present

## 2020-04-16 DIAGNOSIS — G4733 Obstructive sleep apnea (adult) (pediatric): Secondary | ICD-10-CM | POA: Diagnosis not present

## 2020-04-19 ENCOUNTER — Ambulatory Visit (INDEPENDENT_AMBULATORY_CARE_PROVIDER_SITE_OTHER): Payer: Medicare Other | Admitting: Family

## 2020-04-19 ENCOUNTER — Other Ambulatory Visit: Payer: Self-pay

## 2020-04-19 VITALS — BP 126/70 | HR 70 | Ht 61.0 in | Wt 283.0 lb

## 2020-04-19 DIAGNOSIS — R234 Changes in skin texture: Secondary | ICD-10-CM | POA: Diagnosis not present

## 2020-04-19 DIAGNOSIS — H6123 Impacted cerumen, bilateral: Secondary | ICD-10-CM | POA: Diagnosis not present

## 2020-04-19 MED ORDER — NEOMYCIN-POLYMYXIN-HC 3.5-10000-1 OT SOLN
3.0000 [drp] | Freq: Three times a day (TID) | OTIC | 0 refills | Status: DC
Start: 2020-04-19 — End: 2020-12-27

## 2020-04-19 MED ORDER — FLUOCINONIDE 0.05 % EX OINT: 1.0000 "application " | TOPICAL_OINTMENT | Freq: Two times a day (BID) | CUTANEOUS | 0 refills | Status: DC

## 2020-04-19 NOTE — Progress Notes (Signed)
Patient consent obtained. Irrigation with water and peroxide performed. Full view of both tympanic membranes after procedure.  Patient tolerated procedure well.   

## 2020-04-19 NOTE — Progress Notes (Signed)
Scott Curtis is a 29 y.o. male with the following history as recorded in EpicCare:  Patient Active Problem List   Diagnosis Date Noted  . Nocturnal hypoxemia 07/11/2019  . Cough 03/31/2019  . Right conjunctivitis 03/02/2019  . Conjunctivitis of both eyes 12/18/2018  . Acute sinus infection 12/06/2018  . Obstructive sleep apnea 08/19/2018  . Left ear pain 04/16/2018  . Left inguinal hernia 10/16/2017  . Irritability and anger 08/13/2017  . External otitis of right ear 08/13/2017  . Lichen simplex chronicus 11/21/2016  . Seborrheic dermatitis 11/21/2016  . Elevated blood-pressure reading without diagnosis of hypertension 09/15/2016  . Scalp lesion 09/10/2016  . Acute upper respiratory infection 09/10/2016  . Bilateral impacted cerumen 08/21/2016  . Chronic mycotic otitis externa 08/21/2016  . Rash and nonspecific skin eruption 04/06/2015  . Allergic rhinitis 10/26/2014  . Impaired glucose tolerance 09/24/2012  . Acne 09/24/2012  . Allergic conjunctivitis 09/17/2011  . Seizure disorder ([facility]) 09/14/2011  . Preventative health care 09/14/2011  . Development delay 09/14/2011  . Mental retardation 09/14/2011  . Obesity 09/14/2011    Current Outpatient Medications  Medication Sig Dispense Refill  . albuterol (VENTOLIN HFA) 108 (90 Base) MCG/ACT inhaler TAKE 2 PUFFS BY MOUTH EVERY 6 HOURS AS NEEDED FOR WHEEZE OR SHORTNESS OF BREATH 8.5 g 3  . azelastine (OPTIVAR) 0.05 % ophthalmic solution Place 2 drops into both eyes 2 (two) times daily. 6 mL 12  . escitalopram (LEXAPRO) 20 MG tablet Take 1 tablet (20 mg total) by mouth daily. 90 tablet 1  . fluocinonide (LIDEX) 0.05 % external solution Apply topically 2 (two) times daily. 60 mL 1  . fluticasone (FLONASE) 50 MCG/ACT nasal spray Place into both nostrils daily.    Marland Kitchen ipratropium (ATROVENT) 0.03 % nasal spray SMARTSIG:2 Spray(s) Both Nares Every 12 Hours    . Olopatadine HCl 0.2 % SOLN 1 drop each eye once daily 2.5 mL 11  .  fluocinonide ointment (LIDEX) 0.05 % Apply 1 application topically 2 (two) times daily. Apply to knee as directed 60 g 0  . neomycin-polymyxin-hydrocortisone (CORTISPORIN) OTIC solution Place 3 drops into both ears 3 (three) times daily. 10 mL 0   No current facility-administered medications for this visit.    Allergies: Codeine and Penicillin g  Past Medical History:  Diagnosis Date  . Development delay 09/14/2011  . Impaired glucose tolerance 09/24/2012  . Mental retardation 09/14/2011  . Obesity 09/14/2011  . Seizure disorder ([facility]) 09/14/2011    Past Surgical History:  Procedure Laterality Date  . NO PAST SURGERIES      Family History  Problem Relation Age of Onset  . Arthritis Other   . Hypertension Other   . Cancer Other        breast cancer  . Neuropathy Neg Hx     Social History   Tobacco Use  . Smoking status: Never Smoker  . Smokeless tobacco: Never Used  Substance Use Topics  . Alcohol use: No    Subjective:  Requesting to have both ears flushed out today; accompanied by his mother today; last flushed in July 2021 by ENT; Also requesting stronger prescriptive cream for the psoriatic plaque on his knees;   Objective:  Vitals:   04/19/20 1116  BP: 126/70  Pulse: 70  SpO2: 96%  Weight: 283 lb (128.4 kg)  Height: 5\' 1"  (1.549 m)    General: Well developed, well nourished, in no acute distress  Skin : Warm and dry. Thick scaly changes noted over  left knee Head: Normocephalic and atraumatic  Eyes: Sclera and conjunctiva clear; pupils round and reactive to light; extraocular movements intact  Ears: External normal; bilateral cerumen impaction noted initially;  after lavage, canals clear; tympanic membranes normal  Oropharynx: Pink, supple. No suspicious lesions  Neck: Supple without thyromegaly, adenopathy  Lungs: Respirations unlabored;  Neurologic: Alert and oriented; speech intact; face symmetrical; moves all extremities well; CNII-XII intact without focal  deficit   Assessment:  1. Bilateral impacted cerumen   2. Scaly skin     Plan:  1. Ear lavage completed with no complications; 2. ? Psoriasis; trial of Lidex ointment as opposed to Triamcinolone cream; follow-up worse, no better;  This visit occurred during the SARS-CoV-2 public health emergency.  Safety protocols were in place, including screening questions prior to the visit, additional usage of staff PPE, and extensive cleaning of exam room while observing appropriate contact time as indicated for disinfecting solutions.     No follow-ups on file.  No orders of the defined types were placed in this encounter.   Requested Prescriptions   Signed Prescriptions Disp Refills  . fluocinonide ointment (LIDEX) 0.05 % 60 g 0    Sig: Apply 1 application topically 2 (two) times daily. Apply to knee as directed  . neomycin-polymyxin-hydrocortisone (CORTISPORIN) OTIC solution 10 mL 0    Sig: Place 3 drops into both ears 3 (three) times daily.

## 2020-04-27 ENCOUNTER — Encounter: Payer: Self-pay | Admitting: Internal Medicine

## 2020-04-27 ENCOUNTER — Ambulatory Visit (INDEPENDENT_AMBULATORY_CARE_PROVIDER_SITE_OTHER): Payer: Medicare Other | Admitting: Internal Medicine

## 2020-04-27 ENCOUNTER — Other Ambulatory Visit: Payer: Self-pay

## 2020-04-27 DIAGNOSIS — F79 Unspecified intellectual disabilities: Secondary | ICD-10-CM

## 2020-04-27 DIAGNOSIS — G4733 Obstructive sleep apnea (adult) (pediatric): Secondary | ICD-10-CM | POA: Diagnosis not present

## 2020-04-27 NOTE — Progress Notes (Signed)
HPI M never smoker for OSA, complicated by   Hypersomnolence, Allergic Rhinitis, Mental Retardation ( congenital syndrome), Seizure Disorder, Chronic Otitis, Seborrheic Dermatitis, Asthma Mother  provides information. Christiane Ha is non-verbal. HST 04/11/2019- AHI 30.3/ hr, desaturation to 42%, average 81% (nocturnal hypoxemia)  ----------------------------------------------------------------------------------------   10/28/19- 28 yoM never smoker for OSA, Nocturnal Hypoxemia, complicated by  Allergic Rhinitis, Mental Retardation ( congenital syndrome), Seizure Disorder, Chronic Otitis, Seborrheic Dermatitis, Asthma Mother  provides information. Christiane Ha is non-verbal. CPAP auto 5-20/ [facility]      We had asked [facility] to change to 5-15, but not done. Download compliance 87%. AHI 31.8/ hr       Still very high mask leak Mother felt he would not tolerate in-lab study for mask or BIPAP titration. Has had nasal/ sinus congestion, Rx'd by PCP with Zpak then doxy. Body weight today 288 lbs  He fiddles with mask/ leaks a lot.  04/27/20- 29 yoM never smoker for OSA, Nocturnal Hypoxemia, complicated by  Allergic Rhinitis, Mental Retardation ( congenital syndrome), Seizure Disorder, Chronic Otitis, Chronic Sinusitis, Obesity Seborrheic Dermatitis, Asthma Mother  provides information. Christiane Ha is non-verbal. CPAP auto 5-20/ [facility]   Desensitization mask fitting 11/10/19 Download- compliance 100%, AHI 37.4/ hr Body weight today- 291 lbs Covid vax- 2 Phizer Flu vax- declines Mother has not felt he would tolerate in-patient sleep study for BIPAP titration and confirmed this again today. Weight gain blamed on Lexapro. She says he rolls over a lot, dislodging mask and hose. We discussed high breakthrough apnea index. Given dificult situation and his limited ability to understand and communicate, we agreed to continue as we are doing.   ROS-see HPI   + = positive Con titutional:    weight loss, night sweats,  fevers, chills, fatigue, lassitude. HEENT:    headaches, difficulty swallowing, tooth/dental problems, sore throat,       sneezing, itching, ear ache, +nasal congestion, post nasal drip, snoring CV:    chest pain, orthopnea, PND, swelling in lower extremities, anasarca,                                   dizziness, palpitations Resp:   +shortness of breath with exertion or at rest.                productive cough,   non-productive cough, coughing up of blood.              change in color of mucus.  wheezing.   Skin:    rash or lesions. GI:  No-   heartburn, indigestion, abdominal pain, nausea, vomiting, diarrhea,                 change in bowel habits, loss of appetite GU: dysuria, change in color of urine, no urgency or frequency.   flank pain. MS:   joint pain, stiffness, decreased range of motion, back pain. Neuro-     nothing unusual Psych:  change in mood or affect.  depression or +anxiety.   memory loss.  OBJ- Physical Exam General- Alert, Oriented, Affect-appropriate, Distress- none acute, + obese, +pleasant, cooperative with simple instruction. Wants to show me characters on phone superhero videos.  Skin- rash-none, lesions- none, excoriation- none Lymphadenopathy- none Head- atraumatic            Eyes- Gross vision intact, PERRLA, conjunctivae and secretions clear            Ears- Hearing, canals-normal  Nose- Clear, no-Septal dev, mucus, polyps, erosion, perforation             Throat- Mallampati IV , mucosa clear , drainage- none, tonsils- atrophic,  Neck- flexible , trachea midline, no stridor , thyroid nl, carotid no bruit Chest - symmetrical excursion , unlabored           Heart/CV- RRR , no murmur , no gallop  , no rub, nl s1 s2                           - JVD- none , edema- none, stasis changes- none, varices- none           Lung- clear to P&A, wheeze- none, cough- none , dullness-none, rub- none           Chest wall-  Abd-  Br/ Gen/ Rectal- Not done, not  indicated Extrem- cyanosis- none, clubbing, none, atrophy- none, strength- nl Neuro- nonverbal, shook head at my requests. Opened mouth only briefly.

## 2020-04-27 NOTE — Patient Instructions (Signed)
We can continue CPAP auto 5-20  Please call if we can help 

## 2020-04-30 NOTE — Assessment & Plan Note (Signed)
Mother needs support to help keep weight down

## 2020-04-30 NOTE — Assessment & Plan Note (Signed)
Benefits from CPAP. Incomplete control accepted as the bsst we think we can do for now. Mother does not want to try an in-center obernight for BIPAP titration.  Plan- continue auto 5-20. Try to keep weight down.

## 2020-04-30 NOTE — Assessment & Plan Note (Signed)
Mother is a saint, caring for him as well as she can.

## 2020-05-01 ENCOUNTER — Other Ambulatory Visit: Payer: Self-pay | Admitting: Family

## 2020-05-01 ENCOUNTER — Other Ambulatory Visit: Payer: Self-pay | Admitting: Internal Medicine

## 2020-05-16 DIAGNOSIS — G4733 Obstructive sleep apnea (adult) (pediatric): Secondary | ICD-10-CM | POA: Diagnosis not present

## 2020-05-24 DIAGNOSIS — G4733 Obstructive sleep apnea (adult) (pediatric): Secondary | ICD-10-CM | POA: Diagnosis not present

## 2020-05-31 ENCOUNTER — Encounter: Payer: Self-pay | Admitting: Family

## 2020-05-31 ENCOUNTER — Ambulatory Visit (INDEPENDENT_AMBULATORY_CARE_PROVIDER_SITE_OTHER): Payer: Medicare Other | Admitting: Family

## 2020-05-31 ENCOUNTER — Other Ambulatory Visit: Payer: Self-pay

## 2020-05-31 VITALS — BP 112/66 | HR 98 | Temp 98.5°F | Ht 61.0 in | Wt 284.0 lb

## 2020-05-31 DIAGNOSIS — R61 Generalized hyperhidrosis: Secondary | ICD-10-CM

## 2020-05-31 LAB — CBC WITH DIFFERENTIAL/PLATELET
Basophils Absolute: 0.1 10*3/uL (ref 0.0–0.1)
Basophils Relative: 1.2 % (ref 0.0–3.0)
Eosinophils Absolute: 0.1 10*3/uL (ref 0.0–0.7)
Eosinophils Relative: 2 % (ref 0.0–5.0)
HCT: 43.8 % (ref 39.0–52.0)
Hemoglobin: 14.3 g/dL (ref 13.0–17.0)
Lymphocytes Relative: 24.7 % (ref 12.0–46.0)
Lymphs Abs: 1.6 10*3/uL (ref 0.7–4.0)
MCHC: 32.7 g/dL (ref 30.0–36.0)
MCV: 83.7 fl (ref 78.0–100.0)
Monocytes Absolute: 0.4 10*3/uL (ref 0.1–1.0)
Monocytes Relative: 6.7 % (ref 3.0–12.0)
Neutro Abs: 4.2 10*3/uL (ref 1.4–7.7)
Neutrophils Relative %: 65.4 % (ref 43.0–77.0)
Platelets: 285 10*3/uL (ref 150.0–400.0)
RBC: 5.23 Mil/uL (ref 4.22–5.81)
RDW: 15.5 % (ref 11.5–15.5)
WBC: 6.5 10*3/uL (ref 4.0–10.5)

## 2020-05-31 LAB — URINALYSIS
Bilirubin Urine: NEGATIVE
Hgb urine dipstick: NEGATIVE
Ketones, ur: NEGATIVE
Leukocytes,Ua: NEGATIVE
Nitrite: NEGATIVE
Specific Gravity, Urine: >=1.03 — AB (ref 1.000–1.030)
Urine Glucose: NEGATIVE
Urobilinogen, UA: 0.2 (ref 0.0–1.0)
pH: 6 (ref 5.0–8.0)

## 2020-05-31 LAB — COMPREHENSIVE METABOLIC PANEL
ALT: 14 U/L (ref 0–53)
AST: 13 U/L (ref 0–37)
Albumin: 4.2 g/dL (ref 3.5–5.2)
Alkaline Phosphatase: 88 U/L (ref 39–117)
BUN: 10 mg/dL (ref 6–23)
CO2: 31 mEq/L (ref 19–32)
Calcium: 9.7 mg/dL (ref 8.4–10.5)
Chloride: 104 mEq/L (ref 96–112)
Creatinine, Ser: 0.73 mg/dL (ref 0.40–1.50)
GFR: 123.64 mL/min (ref >60.00)
Glucose, Bld: 119 mg/dL — ABNORMAL HIGH (ref 70–99)
Potassium: 3.8 mEq/L (ref 3.5–5.1)
Sodium: 142 mEq/L (ref 135–145)
Total Bilirubin: 0.5 mg/dL (ref 0.2–1.2)
Total Protein: 7.4 g/dL (ref 6.0–8.3)

## 2020-05-31 LAB — TSH: TSH: 1.59 u[IU]/mL (ref 0.35–4.50)

## 2020-05-31 NOTE — Progress Notes (Signed)
Scott Curtis is a 29 y.o. male with the following history as recorded in [employer]:  Patient Active Problem List   Diagnosis Date Noted  . Nocturnal hypoxemia 07/11/2019  . Cough 03/31/2019  . Right conjunctivitis 03/02/2019  . Conjunctivitis of both eyes 12/18/2018  . Acute sinus infection 12/06/2018  . Obstructive sleep apnea 08/19/2018  . Left ear pain 04/16/2018  . Left inguinal hernia 10/16/2017  . Irritability and anger 08/13/2017  . External otitis of right ear 08/13/2017  . Lichen simplex chronicus 11/21/2016  . Seborrheic dermatitis 11/21/2016  . Elevated blood-pressure reading without diagnosis of hypertension 09/15/2016  . Scalp lesion 09/10/2016  . Acute upper respiratory infection 09/10/2016  . Bilateral impacted cerumen 08/21/2016  . Chronic mycotic otitis externa 08/21/2016  . Rash and nonspecific skin eruption 04/06/2015  . Allergic rhinitis 10/26/2014  . Impaired glucose tolerance 09/24/2012  . Acne 09/24/2012  . Allergic conjunctivitis 09/17/2011  . Seizure disorder ([place]) 09/14/2011  . Preventative health care 09/14/2011  . Development delay 09/14/2011  . Intellectual disability 09/14/2011  . Obesity 09/14/2011    Current Outpatient Medications  Medication Sig Dispense Refill  . albuterol (VENTOLIN HFA) 108 (90 Base) MCG/ACT inhaler TAKE 2 PUFFS BY MOUTH EVERY 6 HOURS AS NEEDED FOR WHEEZE OR SHORTNESS OF BREATH 8.5 g 3  . azelastine (OPTIVAR) 0.05 % ophthalmic solution Place 2 drops into both eyes 2 (two) times daily. 6 mL 12  . escitalopram (LEXAPRO) 20 MG tablet TAKE 1 TABLET BY MOUTH EVERY DAY 90 tablet 0  . fluocinonide (LIDEX) 0.05 % external solution Apply topically 2 (two) times daily. 60 mL 1  . fluocinonide ointment (LIDEX) 9.23 % APPLY 1 APPLICATION TOPICALLY 2 (TWO) TIMES DAILY. APPLY TO KNEE AS DIRECTED 60 g 0  . fluticasone (FLONASE) 50 MCG/ACT nasal spray Place into both nostrils daily.    Marland Kitchen ipratropium (ATROVENT) 0.03 % nasal spray SMARTSIG:2  Spray(s) Both Nares Every 12 Hours    . neomycin-polymyxin-hydrocortisone (CORTISPORIN) OTIC solution Place 3 drops into both ears 3 (three) times daily. 10 mL 0  . Olopatadine HCl 0.2 % SOLN 1 drop each eye once daily 2.5 mL 11  . triamcinolone cream (KENALOG) 0.1 % APPLY TO AFFECTED AREA TWICE A DAY 30 g 0   No current facility-administered medications for this visit.    Allergies: Codeine and Penicillin g  Past Medical History:  Diagnosis Date  . Development delay 09/14/2011  . Impaired glucose tolerance 09/24/2012  . Mental retardation 09/14/2011  . Obesity 09/14/2011  . Seizure disorder ([place]) 09/14/2011    Past Surgical History:  Procedure Laterality Date  . NO PAST SURGERIES      Family History  Problem Relation Age of Onset  . Arthritis Other   . Hypertension Other   . Cancer Other        breast cancer  . Neuropathy Neg Hx     Social History   Tobacco Use  . Smoking status: Never Smoker  . Smokeless tobacco: Never Used  Substance Use Topics  . Alcohol use: No    Subjective:  Accompanied by his mother who provides history; Had an episode of night sweats on Sunday evening;  Symptoms seemed less on Monday and then resolved last night; no fever during the day; eating normally; no changes in urination; mother notes he does have sleep apnea; she denies any concerns for the house being too warm; notes that patient is eating very well and no concerns for behavioral changes;  Objective:  Vitals:   05/31/20 0958  BP: 112/66  Pulse: 98  Temp: 98.5 F (36.9 C)  TempSrc: Oral  SpO2: 97%  Weight: 284 lb (128.8 kg)  Height: 5' 1"  (1.549 m)    General: Well developed, well nourished, in no acute distress  Skin : Warm and dry.  Head: Normocephalic and atraumatic  Eyes: Sclera and conjunctiva clear; pupils round and reactive to light; extraocular movements intact  Ears: External normal; canals clear; tympanic membranes normal  Oropharynx: Pink, supple. No suspicious lesions   Neck: Supple without thyromegaly, adenopathy  Lungs: Respirations unlabored; clear to auscultation bilaterally without wheeze, rales, rhonchi  CVS exam: normal rate and regular rhythm.  Abdomen: Soft; nontender; nondistended; normoactive bowel sounds; no masses or hepatosplenomegaly  Musculoskeletal: No deformities; no active joint inflammation  Extremities: No edema, cyanosis, clubbing  Vessels: Symmetric bilaterally  Neurologic: Alert and oriented; speech intact; face symmetrical; moves all extremities well; CNII-XII intact without focal deficit   Assessment:  1. Night sweats     Plan:  Single episode; physical exam today is reassuring; update labs and CXR today; follow-up to be determined;  This visit occurred during the SARS-CoV-2 public health emergency.  Safety protocols were in place, including screening questions prior to the visit, additional usage of staff PPE, and extensive cleaning of exam room while observing appropriate contact time as indicated for disinfecting solutions.     No follow-ups on file.  Orders Placed This Encounter  Procedures  . Urine Culture    Standing Status:   Future    Number of Occurrences:   1    Standing Expiration Date:   05/31/2021  . DG Chest 2 View    Standing Status:   Future    Standing Expiration Date:   05/31/2021    Order Specific Question:   Reason for Exam (SYMPTOM  OR DIAGNOSIS REQUIRED)    Answer:   night sweats    Order Specific Question:   Preferred imaging location?    Answer:   [facility]  . CBC with Differential/Platelet    Standing Status:   Future    Number of Occurrences:   1    Standing Expiration Date:   05/31/2021  . Comp Met (CMET)    Standing Status:   Future    Number of Occurrences:   1    Standing Expiration Date:   05/31/2021  . TSH    Standing Status:   Future    Number of Occurrences:   1    Standing Expiration Date:   05/31/2021  . Urinalysis    Standing Status:   Future    Number of  Occurrences:   1    Standing Expiration Date:   05/31/2021    Requested Prescriptions    No prescriptions requested or ordered in this encounter

## 2020-06-01 LAB — URINE CULTURE: Result:: NO GROWTH

## 2020-06-07 ENCOUNTER — Telehealth: Payer: Self-pay | Admitting: Internal Medicine

## 2020-06-07 NOTE — Telephone Encounter (Signed)
Mother  Gweneth Dimitri calling to get lab results  # [phone]

## 2020-06-14 NOTE — Telephone Encounter (Signed)
LVM to call office back for results. 

## 2020-06-16 DIAGNOSIS — G4733 Obstructive sleep apnea (adult) (pediatric): Secondary | ICD-10-CM | POA: Diagnosis not present

## 2020-06-19 ENCOUNTER — Telehealth: Payer: Self-pay | Admitting: Internal Medicine

## 2020-06-19 NOTE — Telephone Encounter (Signed)
Spoke with pts mother and was able to inform her of Pts lab results. Pts mother understood and has no questions or concerns at this time.

## 2020-06-19 NOTE — Telephone Encounter (Signed)
    Please call patients mother with lab results

## 2020-07-28 ENCOUNTER — Other Ambulatory Visit: Payer: Self-pay | Admitting: Internal Medicine

## 2020-08-02 ENCOUNTER — Telehealth (INDEPENDENT_AMBULATORY_CARE_PROVIDER_SITE_OTHER): Payer: Medicare Other | Admitting: Internal Medicine

## 2020-08-02 DIAGNOSIS — J069 Acute upper respiratory infection, unspecified: Secondary | ICD-10-CM

## 2020-08-02 DIAGNOSIS — J301 Allergic rhinitis due to pollen: Secondary | ICD-10-CM | POA: Diagnosis not present

## 2020-08-02 DIAGNOSIS — R7302 Impaired glucose tolerance (oral): Secondary | ICD-10-CM | POA: Diagnosis not present

## 2020-08-02 MED ORDER — DOXYCYCLINE HYCLATE 100 MG PO TABS: 100.0000 mg | ORAL_TABLET | Freq: Two times a day (BID) | ORAL | 0 refills | Status: DC

## 2020-08-02 NOTE — Progress Notes (Signed)
Patient ID: Scott Curtis, male   DOB: [birth year], 30 y.o.   MRN: [id number]  Virtual Visit via Video Note  I connected with Becky Berberian Gotay on 08/02/20 at  3:20 PM EST by a video enabled telemedicine application and verified that I am speaking with the correct person using two identifiers.  Location of all participants today Patient: at home with mother present Provider: at office   I discussed the limitations of evaluation and management by telemedicine and the availability of in person appointments. The patient expressed understanding and agreed to proceed.  History of Present Illness:  Here with 2-3 days acute onset fever, facial pain, pressure, headache, general weakness and malaise, and greenish d/c, with mild ST and cough, but pt denies chest pain, wheezing, increased sob or doe, orthopnea, PND, increased LE swelling, palpitations, dizziness or syncope.  Allergy symptoms have been well controlled on current meds.   Pt denies polydipsia, polyuria  Past Medical History:  Diagnosis Date  . Development delay 09/14/2011  . Impaired glucose tolerance 09/24/2012  . Mental retardation 09/14/2011  . Obesity 09/14/2011  . Seizure disorder ([facility]) 09/14/2011   Past Surgical History:  Procedure Laterality Date  . NO PAST SURGERIES      reports that he has never smoked. He has never used smokeless tobacco. He reports that he does not drink alcohol and does not use drugs. family history includes Arthritis in an other family member; Cancer in an other family member; Hypertension in an other family member. Allergies  Allergen Reactions  . Codeine Hives  . Penicillin G Rash    Has patient had a PCN reaction causing immediate rash, facial/tongue/throat swelling, SOB or lightheadedness with hypotension: Unknown Has patient had a PCN reaction causing severe rash involving mucus membranes or skin necrosis: No Has patient had a PCN reaction that required hospitalization: No Has patient had a PCN reaction  occurring within the last 10 years: No If all of the above answers are "NO", then may proceed with Cephalosporin use.    Current Outpatient Medications on File Prior to Visit  Medication Sig Dispense Refill  . albuterol (VENTOLIN HFA) 108 (90 Base) MCG/ACT inhaler TAKE 2 PUFFS BY MOUTH EVERY 6 HOURS AS NEEDED FOR WHEEZE OR SHORTNESS OF BREATH 8.5 g 3  . azelastine (OPTIVAR) 0.05 % ophthalmic solution Place 2 drops into both eyes 2 (two) times daily. 6 mL 12  . escitalopram (LEXAPRO) 20 MG tablet TAKE 1 TABLET BY MOUTH EVERY DAY 90 tablet 0  . fluocinonide (LIDEX) 0.05 % external solution Apply topically 2 (two) times daily. 60 mL 1  . fluocinonide ointment (LIDEX) 0.05 % APPLY 1 APPLICATION TOPICALLY 2 (TWO) TIMES DAILY. APPLY TO KNEE AS DIRECTED 60 g 0  . fluticasone (FLONASE) 50 MCG/ACT nasal spray Place into both nostrils daily.    Marland Kitchen ipratropium (ATROVENT) 0.03 % nasal spray SMARTSIG:2 Spray(s) Both Nares Every 12 Hours    . neomycin-polymyxin-hydrocortisone (CORTISPORIN) OTIC solution Place 3 drops into both ears 3 (three) times daily. 10 mL 0  . Olopatadine HCl 0.2 % SOLN 1 drop each eye once daily 2.5 mL 11  . triamcinolone cream (KENALOG) 0.1 % APPLY TO AFFECTED AREA TWICE A DAY 30 g 0   No current facility-administered medications on file prior to visit.   Observations/Objective: Alert, NAD, appropriate mood and affect, resps normal, cn 2-12 intact, moves all 4s, no visible rash or swelling Lab Results  Component Value Date   WBC 6.5 05/31/2020   HGB  14.3 05/31/2020   HCT 43.8 05/31/2020   PLT 285.0 05/31/2020   GLUCOSE 119 (H) 05/31/2020   ALT 14 05/31/2020   AST 13 05/31/2020   NA 142 05/31/2020   K 3.8 05/31/2020   CL 104 05/31/2020   CREATININE 0.73 05/31/2020   BUN 10 05/31/2020   CO2 31 05/31/2020   TSH 1.59 05/31/2020   Assessment and Plan: See notes  Follow Up Instructions: See notes   I discussed the assessment and treatment plan with the patient. The  patient was provided an opportunity to ask questions and all were answered. The patient agreed with the plan and demonstrated an understanding of the instructions.   The patient was advised to call back or seek an in-person evaluation if the symptoms worsen or if the condition fails to improve as anticipated.   [name], MD

## 2020-08-06 ENCOUNTER — Encounter: Payer: Self-pay | Admitting: Internal Medicine

## 2020-08-06 NOTE — Patient Instructions (Signed)
Please take all new medication as prescribed 

## 2020-08-06 NOTE — Assessment & Plan Note (Addendum)
Mild to mod, for antibx course,  to f/u any worsening symptoms or concerns, and consider COVID testing but seems more bacterial today

## 2020-08-06 NOTE — Assessment & Plan Note (Signed)
Stable, cont current med tx    Current Outpatient Medications (Respiratory):  .  albuterol (VENTOLIN HFA) 108 (90 Base) MCG/ACT inhaler, TAKE 2 PUFFS BY MOUTH EVERY 6 HOURS AS NEEDED FOR WHEEZE OR SHORTNESS OF BREATH .  fluticasone (FLONASE) 50 MCG/ACT nasal spray, Place into both nostrils daily. Marland Kitchen  ipratropium (ATROVENT) 0.03 % nasal spray, SMARTSIG:2 Spray(s) Both Nares Every 12 Hours    Current Outpatient Medications (Other):  .  doxycycline (VIBRA-TABS) 100 MG tablet, Take 1 tablet (100 mg total) by mouth 2 (two) times daily. Marland Kitchen  azelastine (OPTIVAR) 0.05 % ophthalmic solution, Place 2 drops into both eyes 2 (two) times daily. Marland Kitchen  escitalopram (LEXAPRO) 20 MG tablet, TAKE 1 TABLET BY MOUTH EVERY DAY .  fluocinonide (LIDEX) 0.05 % external solution, Apply topically 2 (two) times daily. .  fluocinonide ointment (LIDEX) 0.05 %, APPLY 1 APPLICATION TOPICALLY 2 (TWO) TIMES DAILY. APPLY TO KNEE AS DIRECTED .  neomycin-polymyxin-hydrocortisone (CORTISPORIN) OTIC solution, Place 3 drops into both ears 3 (three) times daily. .  Olopatadine HCl 0.2 % SOLN, 1 drop each eye once daily .  triamcinolone cream (KENALOG) 0.1 %, APPLY TO AFFECTED AREA TWICE A DAY

## 2020-08-06 NOTE — Assessment & Plan Note (Signed)
No results found for: HGBA1C Stable, pt to continue current medical treatment  - diet control

## 2020-10-18 ENCOUNTER — Telehealth: Payer: Medicare Other | Admitting: Family

## 2020-10-18 ENCOUNTER — Telehealth (INDEPENDENT_AMBULATORY_CARE_PROVIDER_SITE_OTHER): Payer: Medicare Other | Admitting: Family

## 2020-10-18 DIAGNOSIS — J069 Acute upper respiratory infection, unspecified: Secondary | ICD-10-CM

## 2020-10-18 MED ORDER — AZITHROMYCIN 250 MG PO TABS: ORAL_TABLET | ORAL | 0 refills | Status: DC

## 2020-10-18 NOTE — Progress Notes (Signed)
Scott Curtis is a 30 y.o. male with the following history as recorded in Epic[employer]:  Patient Active Problem List   Diagnosis Date Noted  . Nocturnal hypoxemia 07/11/2019  . Cough 03/31/2019  . Right conjunctivitis 03/02/2019  . Conjunctivitis of both eyes 12/18/2018  . Acute sinus infection 12/06/2018  . Obstructive sleep apnea 08/19/2018  . Left ear pain 04/16/2018  . Left inguinal hernia 10/16/2017  . Irritability and anger 08/13/2017  . External otitis of right ear 08/13/2017  . Lichen simplex chronicus 11/21/2016  . Seborrheic dermatitis 11/21/2016  . Elevated blood-pressure reading without diagnosis of hypertension 09/15/2016  . Scalp lesion 09/10/2016  . Acute upper respiratory infection 09/10/2016  . Bilateral impacted cerumen 08/21/2016  . Chronic mycotic otitis externa 08/21/2016  . Rash and nonspecific skin eruption 04/06/2015  . Allergic rhinitis 10/26/2014  . Impaired glucose tolerance 09/24/2012  . Acne 09/24/2012  . Allergic conjunctivitis 09/17/2011  . Seizure disorder ([facility]) 09/14/2011  . Preventative health care 09/14/2011  . Development delay 09/14/2011  . Intellectual disability 09/14/2011  . Obesity 09/14/2011    Current Outpatient Medications  Medication Sig Dispense Refill  . azithromycin (ZITHROMAX) 250 MG tablet 2 tabs po qd x 1 day; 1 tablet per day x 4 days; 6 tablet 0  . albuterol (VENTOLIN HFA) 108 (90 Base) MCG/ACT inhaler TAKE 2 PUFFS BY MOUTH EVERY 6 HOURS AS NEEDED FOR WHEEZE OR SHORTNESS OF BREATH 8.5 g 3  . azelastine (OPTIVAR) 0.05 % ophthalmic solution Place 2 drops into both eyes 2 (two) times daily. 6 mL 12  . escitalopram (LEXAPRO) 20 MG tablet TAKE 1 TABLET BY MOUTH EVERY DAY 90 tablet 0  . fluocinonide (LIDEX) 0.05 % external solution Apply topically 2 (two) times daily. 60 mL 1  . fluocinonide ointment (LIDEX) 0.05 % APPLY 1 APPLICATION TOPICALLY 2 (TWO) TIMES DAILY. APPLY TO KNEE AS DIRECTED 60 g 0  . fluticasone (FLONASE) 50 MCG/ACT  nasal spray Place into both nostrils daily.    Marland Kitchen ipratropium (ATROVENT) 0.03 % nasal spray SMARTSIG:2 Spray(s) Both Nares Every 12 Hours    . neomycin-polymyxin-hydrocortisone (CORTISPORIN) OTIC solution Place 3 drops into both ears 3 (three) times daily. 10 mL 0  . Olopatadine HCl 0.2 % SOLN 1 drop each eye once daily 2.5 mL 11  . triamcinolone cream (KENALOG) 0.1 % APPLY TO AFFECTED AREA TWICE A DAY 30 g 0   No current facility-administered medications for this visit.    Allergies: Codeine and Penicillin g  Past Medical History:  Diagnosis Date  . Development delay 09/14/2011  . Impaired glucose tolerance 09/24/2012  . Mental retardation 09/14/2011  . Obesity 09/14/2011  . Seizure disorder ([facility]) 09/14/2011    Past Surgical History:  Procedure Laterality Date  . NO PAST SURGERIES      Family History  Problem Relation Age of Onset  . Arthritis Other   . Hypertension Other   . Cancer Other        breast cancer  . Neuropathy Neg Hx     Social History   Tobacco Use  . Smoking status: Never Smoker  . Smokeless tobacco: Never Used  Substance Use Topics  . Alcohol use: No    Subjective:   I connected with Scott Curtis on 10/18/20 at  9:40 AM EDT by a telephone call and verified that I am speaking with the correct person using two identifiers.   I discussed the limitations of evaluation and management by telemedicine and the availability  of in person appointments. The patient expressed understanding and agreed to proceed. Provider in office/ patient is at home; provider and patient are only 2 people on telephone call.   Visit is done by mother- patient limited ability to participate in phone call; requesting Rx for Z-pak; prone to recurrent sinus/ ear infections; "allergies are really acting up." No fever, chest pain or shortness of breath per mother;    Objective:  There were no vitals filed for this visit.  Lungs: Respirations unlabored;  Neurologic: Alert and oriented; speech  intact; face symmetrical;   Assessment:  1. Acute upper respiratory infection     Plan:  Rx for Z-pak #1 take as directed; follow-up with PCP with continued concerns or questions;   Time spent 8 minutes  No follow-ups on file.  No orders of the defined types were placed in this encounter.   Requested Prescriptions   Signed Prescriptions Disp Refills  . azithromycin (ZITHROMAX) 250 MG tablet 6 tablet 0    Sig: 2 tabs po qd x 1 day; 1 tablet per day x 4 days;

## 2020-10-28 ENCOUNTER — Other Ambulatory Visit: Payer: Self-pay | Admitting: Internal Medicine

## 2020-12-27 ENCOUNTER — Other Ambulatory Visit: Payer: Self-pay

## 2020-12-27 ENCOUNTER — Ambulatory Visit (INDEPENDENT_AMBULATORY_CARE_PROVIDER_SITE_OTHER): Payer: Medicare Other | Admitting: Internal Medicine

## 2020-12-27 ENCOUNTER — Encounter: Payer: Self-pay | Admitting: Internal Medicine

## 2020-12-27 VITALS — BP 114/70 | HR 86 | Ht 61.0 in | Wt 290.0 lb

## 2020-12-27 DIAGNOSIS — L299 Pruritus, unspecified: Secondary | ICD-10-CM

## 2020-12-27 DIAGNOSIS — J301 Allergic rhinitis due to pollen: Secondary | ICD-10-CM | POA: Diagnosis not present

## 2020-12-27 DIAGNOSIS — H1013 Acute atopic conjunctivitis, bilateral: Secondary | ICD-10-CM | POA: Diagnosis not present

## 2020-12-27 DIAGNOSIS — B369 Superficial mycosis, unspecified: Secondary | ICD-10-CM

## 2020-12-27 DIAGNOSIS — Z131 Encounter for screening for diabetes mellitus: Secondary | ICD-10-CM

## 2020-12-27 DIAGNOSIS — Z1159 Encounter for screening for other viral diseases: Secondary | ICD-10-CM

## 2020-12-27 DIAGNOSIS — H6240 Otitis externa in other diseases classified elsewhere, unspecified ear: Secondary | ICD-10-CM

## 2020-12-27 DIAGNOSIS — Z0001 Encounter for general adult medical examination with abnormal findings: Secondary | ICD-10-CM | POA: Diagnosis not present

## 2020-12-27 DIAGNOSIS — R7302 Impaired glucose tolerance (oral): Secondary | ICD-10-CM

## 2020-12-27 LAB — POCT GLYCOSYLATED HEMOGLOBIN (HGB A1C): Hemoglobin A1C: 5.7 % — AB (ref 4.0–5.6)

## 2020-12-27 MED ORDER — ESCITALOPRAM OXALATE 20 MG PO TABS
1.0000 | ORAL_TABLET | Freq: Every day | ORAL | 3 refills | Status: DC
Start: 2020-12-27 — End: 2021-11-07

## 2020-12-27 MED ORDER — METHYLPREDNISOLONE ACETATE 80 MG/ML IJ SUSP
80.0000 mg | Freq: Once | INTRAMUSCULAR | Status: AC
  Administered 2020-12-27: 80 mg via INTRAMUSCULAR

## 2020-12-27 MED ORDER — ALBUTEROL SULFATE HFA 108 (90 BASE) MCG/ACT IN AERS: INHALATION_SPRAY | RESPIRATORY_TRACT | 3 refills | Status: DC

## 2020-12-27 MED ORDER — AZELASTINE HCL 0.05 % OP SOLN: 2.0000 [drp] | Freq: Two times a day (BID) | OPHTHALMIC | 12 refills | Status: DC

## 2020-12-27 MED ORDER — CLOTRIMAZOLE-BETAMETHASONE 1-0.05 % EX CREA: TOPICAL_CREAM | CUTANEOUS | 2 refills | Status: DC

## 2020-12-27 MED ORDER — TRIAMCINOLONE ACETONIDE 55 MCG/ACT NA AERO: 2.0000 | INHALATION_SPRAY | Freq: Every day | NASAL | 12 refills | Status: DC

## 2020-12-27 NOTE — Progress Notes (Signed)
Patient ID: Scott Curtis, male   DOB: [birth year], 30 y.o.   MRN: [id number]         Chief Complaint:: wellness exam and Ear Drainage  , allergies and eye symptoms, hyperglycemia       HPI:  [name] is a 30 y.o. male here for wellness exam with mother who give hx; declines Tdap, o/w up to date with preventive referrals and immunizations                        Also Does have several wks ongoing nasal and eye allergy symptoms with clearish congestion, itch and sneezing, without fever, pain, ST, cough, swelling or wheezing,  Pt denies polydipsia, polyuria, or new focal neuro s/s.  Also has chronic bilateral ear itching and some wax.  Pt denies chest pain, increased sob or doe, wheezing, orthopnea, PND, increased LE swelling, palpitations, dizziness or syncope.  Pt denies fever, wt loss, night sweats, loss of appetite, or other constitutional symptoms   Wt Readings from Last 3 Encounters:  12/27/20 290 lb (131.5 kg)  05/31/20 284 lb (128.8 kg)  04/27/20 291 lb 12.8 oz (132.4 kg)   BP Readings from Last 3 Encounters:  12/27/20 114/70  05/31/20 112/66  04/27/20 114/68   Immunization History  Administered Date(s) Administered   PFIZER(Purple Top)SARS-COV-2 Vaccination 11/24/2019, 12/27/2019   Health Maintenance Due  Topic Date Due   Hepatitis C Screening  Never done      Past Medical History:  Diagnosis Date   Development delay 09/14/2011   Impaired glucose tolerance 09/24/2012   Mental retardation 09/14/2011   Obesity 09/14/2011   Seizure disorder (HCC) 09/14/2011   Past Surgical History:  Procedure Laterality Date   NO PAST SURGERIES      reports that he has never smoked. He has never used smokeless tobacco. He reports that he does not drink alcohol and does not use drugs. family history includes Arthritis in an other family member; Cancer in an other family member; Hypertension in an other family member. Allergies  Allergen Reactions   Codeine Hives   Penicillin G Rash     Has patient had a PCN reaction causing immediate rash, facial/tongue/throat swelling, SOB or lightheadedness with hypotension: Unknown Has patient had a PCN reaction causing severe rash involving mucus membranes or skin necrosis: No Has patient had a PCN reaction that required hospitalization: No Has patient had a PCN reaction occurring within the last 10 years: No If all of the above answers are "NO", then may proceed with Cephalosporin use.    Current Outpatient Medications on File Prior to Visit  Medication Sig Dispense Refill   fluocinonide (LIDEX) 0.05 % external solution Apply topically 2 (two) times daily. 60 mL 1   fluocinonide ointment (LIDEX) 0.05 % APPLY 1 APPLICATION TOPICALLY 2 (TWO) TIMES DAILY. APPLY TO KNEE AS DIRECTED 60 g 0   No current facility-administered medications on file prior to visit.        ROS:  All others reviewed and negative.  Objective        PE:  BP 114/70 (BP Location: Left Arm, Patient Position: Sitting, Cuff Size: Large)   Pulse 86   Ht 5\' 1"  (1.549 m)   Wt 290 lb (131.5 kg)   SpO2 96%   BMI 54.80 kg/m                 Constitutional: Pt appears in NAD  HENT: Head: NCAT.                Right Ear: External ear normal.                 Left Ear: External ear normal.                Eyes: . Pupils are equal, round, and reactive to light. Conjunctivae and EOM are normal except for bilat conjucntival erythema and watery eyes; bilat ear canals with mild erythema               Nose: without d/c or deformity               Neck: Neck supple. Gross normal ROM               Cardiovascular: Normal rate and regular rhythm.                 Pulmonary/Chest: Effort normal and breath sounds without rales or wheezing.                Abd:  Soft, NT, ND, + BS, no organomegaly               Neurological: Pt is alert. At baseline orientation, motor grossly intact               Skin: Skin is warm. No rashes, no other new lesions, LE edema - none                Psychiatric: Pt behavior is normal without agitation   Micro: none  Cardiac tracings I have personally interpreted today:  none  Pertinent Radiological findings (summarize): none   Lab Results  Component Value Date   WBC 6.5 05/31/2020   HGB 14.3 05/31/2020   HCT 43.8 05/31/2020   PLT 285.0 05/31/2020   GLUCOSE 119 (H) 05/31/2020   ALT 14 05/31/2020   AST 13 05/31/2020   NA 142 05/31/2020   K 3.8 05/31/2020   CL 104 05/31/2020   CREATININE 0.73 05/31/2020   BUN 10 05/31/2020   CO2 31 05/31/2020   TSH 1.59 05/31/2020   HGBA1C 5.7 (A) 12/27/2020   POCT glycosylated hemoglobin (Hb A1C) Order: [id number] Status: Final result   Visible to patient: Yes (not seen)   Dx: Encounter for screening for diabetes ...   0 Result Notes   Ref Range & Units 14:59  Hemoglobin A1C 4.0 - 5.6 % 5.7 Abnormal       Assessment/Plan:  Scott Curtis is a 30 y.o. Black or African American [2] male with  has a past medical history of Development delay (09/14/2011), Impaired glucose tolerance (09/24/2012), Mental retardation (09/14/2011), Obesity (09/14/2011), and Seizure disorder (HCC) (09/14/2011).  Encounter for well adult exam with abnormal findings Age and sex appropriate education and counseling updated with regular exercise and diet Referrals for preventative services - none needed Immunizations addressed - decliens Tdap Smoking counseling  - none needed Evidence for depression or other mood disorder - none significant Most recent labs reviewed. I have personally reviewed and have noted: 1) the patient's medical and social history 2) The patient's current medications and supplements 3) The patient's height, weight, and BMI have been recorded in the chart   Allergic conjunctivitis Mild to mod, for depomedrl im 80, also optivar asd,  to f/u any worsening symptoms or concerns   Impaired glucose tolerance a1c is normal, cont to work on diet and wt control  Allergic  rhinitis Mild to  mod, for nasacort asd,  to f/u any worsening symptoms or concerns  Chronic mycotic otitis externa Ok for lotrisone cr prn asd,  to f/u any worsening symptoms or concerns  Followup: Return in about 6 months (around 06/28/2021).  [name], MD 01/01/2021 9:26 PM [facility]

## 2020-12-27 NOTE — Patient Instructions (Addendum)
Your A1c was OK today at 5.7 (normal)  You had the steroid shot today  Please take all new medication as prescribed - the steroid/antifungal cream for the ears  Please take all new medication as prescribed - the nasacort for nose, and optivar for eyes  Please continue all other medications as before, including the lexapro  Please have the pharmacy call with any other refills you may need.  Please continue your efforts at being more active, low cholesterol diet, and weight control.  You are otherwise up to date with prevention measures today.  Please keep your appointments with your specialists as you may have planned  Please make an Appointment to return in 6 months, or sooner if needed

## 2021-01-01 ENCOUNTER — Encounter: Payer: Self-pay | Admitting: Internal Medicine

## 2021-01-01 NOTE — Assessment & Plan Note (Signed)
Ok for [employer] cr prn asd,  to f/u any worsening symptoms or concerns

## 2021-01-01 NOTE — Assessment & Plan Note (Signed)
a1c is normal, cont to work on diet and wt control

## 2021-01-01 NOTE — Assessment & Plan Note (Signed)
Mild to mod, for nasacort asd,  to f/u any worsening symptoms or concerns 

## 2021-01-01 NOTE — Assessment & Plan Note (Signed)
Mild to mod, for depomedrl im 80, also optivar asd,  to f/u any worsening symptoms or concerns

## 2021-01-01 NOTE — Assessment & Plan Note (Signed)
Age and sex appropriate education and counseling updated with regular exercise and diet Referrals for preventative services - none needed Immunizations addressed - decliens Tdap Smoking counseling  - none needed Evidence for depression or other mood disorder - none significant Most recent labs reviewed. I have personally reviewed and have noted: 1) the patient's medical and social history 2) The patient's current medications and supplements 3) The patient's height, weight, and BMI have been recorded in the chart

## 2021-02-05 ENCOUNTER — Other Ambulatory Visit: Payer: Self-pay | Admitting: Internal Medicine

## 2021-02-05 ENCOUNTER — Other Ambulatory Visit: Payer: Self-pay | Admitting: Family

## 2021-02-22 ENCOUNTER — Telehealth (INDEPENDENT_AMBULATORY_CARE_PROVIDER_SITE_OTHER): Payer: Medicare Other | Admitting: Internal Medicine

## 2021-02-22 DIAGNOSIS — J069 Acute upper respiratory infection, unspecified: Secondary | ICD-10-CM | POA: Diagnosis not present

## 2021-02-22 DIAGNOSIS — L02429 Furuncle of limb, unspecified: Secondary | ICD-10-CM

## 2021-02-22 DIAGNOSIS — J301 Allergic rhinitis due to pollen: Secondary | ICD-10-CM

## 2021-02-22 MED ORDER — DOXYCYCLINE HYCLATE 100 MG PO TABS: 100.0000 mg | ORAL_TABLET | Freq: Two times a day (BID) | ORAL | 0 refills | Status: DC

## 2021-02-22 MED ORDER — PREDNISONE 10 MG PO TABS: ORAL_TABLET | ORAL | 0 refills | Status: DC

## 2021-02-22 NOTE — Progress Notes (Signed)
Patient ID: Scott Curtis, male   DOB: [birth year], 30 y.o.   MRN: [id number]  Virtual Visit via Video Note  I connected with Scott Curtis on 02/22/21 at  3:20 PM EDT by a video enabled telemedicine application and verified that I am speaking with the correct person using two identifiers.  Location of all participants today Patient: at home with mother Provider: at office   I discussed the limitations of evaluation and management by telemedicine and the availability of in person appointments. The patient expressed understanding and agreed to proceed.  History of Present Illness:  Here with 2-3 days acute onset fever, right ear and facial pain, pressure, headache, general weakness and malaise, and greenish d/c, with mild ST and cough, but pt denies chest pain, wheezing, increased sob or doe, orthopnea, PND, increased LE swelling, palpitations, dizziness or syncope.  Also has a small boil to right thigh without drainage or chills.  Does have several wks ongoing nasal allergy symptoms with clearish congestion, itch and sneezing, without fever, pain, ST, cough, swelling or wheezing   Past Medical History:  Diagnosis Date   Development delay 09/14/2011   Impaired glucose tolerance 09/24/2012   Mental retardation 09/14/2011   Obesity 09/14/2011   Seizure disorder (HCC) 09/14/2011   Past Surgical History:  Procedure Laterality Date   NO PAST SURGERIES      reports that he has never smoked. He has never used smokeless tobacco. He reports that he does not drink alcohol and does not use drugs. family history includes Arthritis in an other family member; Cancer in an other family member; Hypertension in an other family member. Allergies  Allergen Reactions   Codeine Hives   Penicillin G Rash    Has patient had a PCN reaction causing immediate rash, facial/tongue/throat swelling, SOB or lightheadedness with hypotension: Unknown Has patient had a PCN reaction causing severe rash involving mucus  membranes or skin necrosis: No Has patient had a PCN reaction that required hospitalization: No Has patient had a PCN reaction occurring within the last 10 years: No If all of the above answers are "NO", then may proceed with Cephalosporin use.    Current Outpatient Medications on File Prior to Visit  Medication Sig Dispense Refill   albuterol (VENTOLIN HFA) 108 (90 Base) MCG/ACT inhaler TAKE 2 PUFFS BY MOUTH EVERY 6 HOURS AS NEEDED FOR WHEEZE OR SHORTNESS OF BREATH 8.5 g 3   azelastine (OPTIVAR) 0.05 % ophthalmic solution Place 2 drops into both eyes 2 (two) times daily. 6 mL 12   clotrimazole-betamethasone (LOTRISONE) cream Use as directed twice per day as needed to affected area 45 g 2   escitalopram (LEXAPRO) 20 MG tablet Take 1 tablet (20 mg total) by mouth daily. 90 tablet 3   fluocinonide (LIDEX) 0.05 % external solution APPLY TO AFFECTED AREA TWICE A DAY 60 mL 1   fluocinonide ointment (LIDEX) 0.05 % APPLY 1 APPLICATION TOPICALLY 2 (TWO) TIMES DAILY. APPLY TO KNEE AS DIRECTED 60 g 0   triamcinolone (NASACORT) 55 MCG/ACT AERO nasal inhaler Place 2 sprays into the nose daily. 1 each 12   triamcinolone cream (KENALOG) 0.1 % APPLY TO AFFECTED AREA TWICE A DAY 30 g 0   No current facility-administered medications on file prior to visit.     Observations/Objective: Alert, NAD, appropriate mood and affect, resps normal, cn 2-12 intact, moves all 4s, no visible rash or swelling Lab Results  Component Value Date   WBC 6.5 05/31/2020   HGB 14.3  05/31/2020   HCT 43.8 05/31/2020   PLT 285.0 05/31/2020   GLUCOSE 119 (H) 05/31/2020   ALT 14 05/31/2020   AST 13 05/31/2020   NA 142 05/31/2020   K 3.8 05/31/2020   CL 104 05/31/2020   CREATININE 0.73 05/31/2020   BUN 10 05/31/2020   CO2 31 05/31/2020   TSH 1.59 05/31/2020   HGBA1C 5.7 (A) 12/27/2020     Assessment and Plan: See notes  Follow Up Instructions: See notes   I discussed the assessment and treatment plan with the  patient. The patient was provided an opportunity to ask questions and all were answered. The patient agreed with the plan and demonstrated an understanding of the instructions.   The patient was advised to call back or seek an in-person evaluation if the symptoms worsen or if the condition fails to improve as anticipated.  [name], MD

## 2021-02-24 ENCOUNTER — Encounter: Payer: Self-pay | Admitting: Internal Medicine

## 2021-02-24 DIAGNOSIS — L02429 Furuncle of limb, unspecified: Secondary | ICD-10-CM | POA: Insufficient documentation

## 2021-02-24 NOTE — Assessment & Plan Note (Signed)
Mild to mod, for antibx course,  to f/u any worsening symptoms or concerns 

## 2021-02-24 NOTE — Assessment & Plan Note (Signed)
/  Mild to mod, for predpac asd,  to f/u any worsening symptoms or concerns 

## 2021-02-24 NOTE — Assessment & Plan Note (Signed)
Small, Mild to mod, for antibx course,  to f/u any worsening symptoms or concerns, consider surgical referral if not imrpvoed

## 2021-02-24 NOTE — Patient Instructions (Signed)
Please take all new medication as prescribed 

## 2021-04-13 ENCOUNTER — Telehealth: Payer: Self-pay | Admitting: Internal Medicine

## 2021-04-13 NOTE — Telephone Encounter (Signed)
HYDROcodone-homatropine (HYCODAN) 5-1.5 MG/5ML syrup

## 2021-05-02 DIAGNOSIS — G4733 Obstructive sleep apnea (adult) (pediatric): Secondary | ICD-10-CM | POA: Diagnosis not present

## 2021-05-04 DIAGNOSIS — H5213 Myopia, bilateral: Secondary | ICD-10-CM | POA: Diagnosis not present

## 2021-05-04 DIAGNOSIS — H50111 Monocular exotropia, right eye: Secondary | ICD-10-CM | POA: Diagnosis not present

## 2021-05-04 DIAGNOSIS — H04123 Dry eye syndrome of bilateral lacrimal glands: Secondary | ICD-10-CM | POA: Diagnosis not present

## 2021-05-04 DIAGNOSIS — H53001 Unspecified amblyopia, right eye: Secondary | ICD-10-CM | POA: Diagnosis not present

## 2021-07-31 DIAGNOSIS — G4733 Obstructive sleep apnea (adult) (pediatric): Secondary | ICD-10-CM | POA: Diagnosis not present

## 2021-08-23 ENCOUNTER — Encounter: Payer: Self-pay | Admitting: Internal Medicine

## 2021-08-23 ENCOUNTER — Other Ambulatory Visit: Payer: Self-pay | Admitting: Internal Medicine

## 2021-08-23 ENCOUNTER — Other Ambulatory Visit: Payer: Self-pay

## 2021-08-23 ENCOUNTER — Ambulatory Visit (INDEPENDENT_AMBULATORY_CARE_PROVIDER_SITE_OTHER): Payer: Medicare Other | Admitting: Internal Medicine

## 2021-08-23 VITALS — BP 118/70 | HR 82 | Temp 98.7°F | Ht 61.0 in | Wt 281.0 lb

## 2021-08-23 DIAGNOSIS — R739 Hyperglycemia, unspecified: Secondary | ICD-10-CM | POA: Diagnosis not present

## 2021-08-23 DIAGNOSIS — Z0001 Encounter for general adult medical examination with abnormal findings: Secondary | ICD-10-CM

## 2021-08-23 DIAGNOSIS — B369 Superficial mycosis, unspecified: Secondary | ICD-10-CM | POA: Diagnosis not present

## 2021-08-23 DIAGNOSIS — H1013 Acute atopic conjunctivitis, bilateral: Secondary | ICD-10-CM

## 2021-08-23 DIAGNOSIS — H6123 Impacted cerumen, bilateral: Secondary | ICD-10-CM | POA: Diagnosis not present

## 2021-08-23 DIAGNOSIS — H1033 Unspecified acute conjunctivitis, bilateral: Secondary | ICD-10-CM

## 2021-08-23 DIAGNOSIS — R7302 Impaired glucose tolerance (oral): Secondary | ICD-10-CM

## 2021-08-23 DIAGNOSIS — E559 Vitamin D deficiency, unspecified: Secondary | ICD-10-CM | POA: Diagnosis not present

## 2021-08-23 DIAGNOSIS — H6240 Otitis externa in other diseases classified elsewhere, unspecified ear: Secondary | ICD-10-CM | POA: Diagnosis not present

## 2021-08-23 DIAGNOSIS — R454 Irritability and anger: Secondary | ICD-10-CM | POA: Diagnosis not present

## 2021-08-23 DIAGNOSIS — E538 Deficiency of other specified B group vitamins: Secondary | ICD-10-CM | POA: Diagnosis not present

## 2021-08-23 DIAGNOSIS — H6120 Impacted cerumen, unspecified ear: Secondary | ICD-10-CM | POA: Insufficient documentation

## 2021-08-23 LAB — URINALYSIS, ROUTINE W REFLEX MICROSCOPIC
Bilirubin Urine: NEGATIVE
Hgb urine dipstick: NEGATIVE
Ketones, ur: NEGATIVE
Leukocytes,Ua: NEGATIVE
Nitrite: NEGATIVE
RBC / HPF: NONE SEEN (ref 0–?)
Specific Gravity, Urine: 1.025 (ref 1.000–1.030)
Urine Glucose: NEGATIVE
Urobilinogen, UA: 1 (ref 0.0–1.0)
pH: 6.5 (ref 5.0–8.0)

## 2021-08-23 LAB — HEPATIC FUNCTION PANEL
ALT: 12 U/L (ref 0–53)
AST: 14 U/L (ref 0–37)
Albumin: 4.2 g/dL (ref 3.5–5.2)
Alkaline Phosphatase: 75 U/L (ref 39–117)
Bilirubin, Direct: 0.1 mg/dL (ref 0.0–0.3)
Total Bilirubin: 0.9 mg/dL (ref 0.2–1.2)
Total Protein: 7.4 g/dL (ref 6.0–8.3)

## 2021-08-23 LAB — LIPID PANEL
Cholesterol: 182 mg/dL (ref 0–200)
HDL: 39.7 mg/dL (ref >39.00)
LDL Cholesterol: 120 mg/dL — ABNORMAL HIGH (ref 0–99)
NonHDL: 142.45
Total CHOL/HDL Ratio: 5
Triglycerides: 113 mg/dL (ref 0.0–149.0)
VLDL: 22.6 mg/dL (ref 0.0–40.0)

## 2021-08-23 LAB — BASIC METABOLIC PANEL
BUN: 9 mg/dL (ref 6–23)
CO2: 31 mEq/L (ref 19–32)
Calcium: 9.8 mg/dL (ref 8.4–10.5)
Chloride: 105 mEq/L (ref 96–112)
Creatinine, Ser: 0.8 mg/dL (ref 0.40–1.50)
GFR: 119.23 mL/min (ref >60.00)
Glucose, Bld: 94 mg/dL (ref 70–99)
Potassium: 4 mEq/L (ref 3.5–5.1)
Sodium: 144 mEq/L (ref 135–145)

## 2021-08-23 LAB — CBC WITH DIFFERENTIAL/PLATELET
Basophils Absolute: 0 10*3/uL (ref 0.0–0.1)
Basophils Relative: 0.7 % (ref 0.0–3.0)
Eosinophils Absolute: 0.2 10*3/uL (ref 0.0–0.7)
Eosinophils Relative: 2.4 % (ref 0.0–5.0)
HCT: 42.7 % (ref 39.0–52.0)
Hemoglobin: 13.7 g/dL (ref 13.0–17.0)
Lymphocytes Relative: 20.7 % (ref 12.0–46.0)
Lymphs Abs: 1.3 10*3/uL (ref 0.7–4.0)
MCHC: 32.2 g/dL (ref 30.0–36.0)
MCV: 83.9 fl (ref 78.0–100.0)
Monocytes Absolute: 0.5 10*3/uL (ref 0.1–1.0)
Monocytes Relative: 8.4 % (ref 3.0–12.0)
Neutro Abs: 4.3 10*3/uL (ref 1.4–7.7)
Neutrophils Relative %: 67.8 % (ref 43.0–77.0)
Platelets: 275 10*3/uL (ref 150.0–400.0)
RBC: 5.08 Mil/uL (ref 4.22–5.81)
RDW: 15.1 % (ref 11.5–15.5)
WBC: 6.4 10*3/uL (ref 4.0–10.5)

## 2021-08-23 LAB — VITAMIN D 25 HYDROXY (VIT D DEFICIENCY, FRACTURES): VITD: 7.17 ng/mL — ABNORMAL LOW (ref 30.00–100.00)

## 2021-08-23 LAB — HEMOGLOBIN A1C: Hgb A1c MFr Bld: 6 % (ref 4.6–6.5)

## 2021-08-23 LAB — TSH: TSH: 1.49 u[IU]/mL (ref 0.35–5.50)

## 2021-08-23 LAB — VITAMIN B12: Vitamin B-12: 196 pg/mL — ABNORMAL LOW (ref 211–911)

## 2021-08-23 MED ORDER — CHOLECALCIFEROL 50 MCG (2000 UT) PO TABS: ORAL_TABLET | ORAL | 99 refills | Status: DC

## 2021-08-23 MED ORDER — VITAMIN B-12 1000 MCG PO TABS: 1000.0000 ug | ORAL_TABLET | Freq: Every day | ORAL | 3 refills | Status: DC

## 2021-08-23 MED ORDER — AZELASTINE HCL 0.05 % OP SOLN: 2.0000 [drp] | Freq: Two times a day (BID) | OPHTHALMIC | 12 refills | Status: DC

## 2021-08-23 NOTE — Progress Notes (Signed)
Patient ID: Scott Curtis, male   DOB: [birth year], 31 y.o.   MRN: [id number]         Chief Complaint:: wellness exam and eye allergies, anxiety, bilateral ear wax       HPI:  Scott Curtis is a 31 y.o. male here for wellness exam with mother present; declines hep c screen, flu shot, covid booster, o/w up to date                        Also c/w 1 wk worsening eye allergy symptoms with itching and clearish discharge without pain, vision change or fever.  Denies worsening depressive symptoms, suicidal ideation, or panic; has ongoing anxiety, not increased recently and seems to be doing overall well on lexapro but does have occasional agitation but easily redirectable per mother.  Does have worsening hearing at times it seems, tends to accumulate wax, mother asks for ENT as he does better with that type of removal.  Pt denies chest pain, increased sob or doe, wheezing, orthopnea, PND, increased LE swelling, palpitations, dizziness or syncope.   Pt denies polydipsia, polyuria, or new focal neuro s/s.  Remains morbid obese and not amenable too much to suggestion for exercise.  Spends hours per day with games on his phone.   Pt denies fever, wt loss, night sweats, loss of appetite, or other constitutional symptoms     Wt Readings from Last 3 Encounters:  08/23/21 281 lb (127.5 kg)  12/27/20 290 lb (131.5 kg)  05/31/20 284 lb (128.8 kg)   BP Readings from Last 3 Encounters:  08/23/21 118/70  12/27/20 114/70  05/31/20 112/66   Immunization History  Administered Date(s) Administered   PFIZER(Purple Top)SARS-COV-2 Vaccination 11/24/2019, 12/27/2019   There are no preventive care reminders to display for this patient.     Past Medical History:  Diagnosis Date   Development delay 09/14/2011   Impaired glucose tolerance 09/24/2012   Mental retardation 09/14/2011   Obesity 09/14/2011   Seizure disorder (HCC) 09/14/2011   Past Surgical History:  Procedure Laterality Date   NO PAST SURGERIES       reports that he has never smoked. He has never used smokeless tobacco. He reports that he does not drink alcohol and does not use drugs. family history includes Arthritis in an other family member; Cancer in an other family member; Hypertension in an other family member. Allergies  Allergen Reactions   Codeine Hives   Penicillin G Rash    Has patient had a PCN reaction causing immediate rash, facial/tongue/throat swelling, SOB or lightheadedness with hypotension: Unknown Has patient had a PCN reaction causing severe rash involving mucus membranes or skin necrosis: No Has patient had a PCN reaction that required hospitalization: No Has patient had a PCN reaction occurring within the last 10 years: No If all of the above answers are "NO", then may proceed with Cephalosporin use.    Current Outpatient Medications on File Prior to Visit  Medication Sig Dispense Refill   albuterol (VENTOLIN HFA) 108 (90 Base) MCG/ACT inhaler TAKE 2 PUFFS BY MOUTH EVERY 6 HOURS AS NEEDED FOR WHEEZE OR SHORTNESS OF BREATH 8.5 g 3   clotrimazole-betamethasone (LOTRISONE) cream Use as directed twice per day as needed to affected area 45 g 2   escitalopram (LEXAPRO) 20 MG tablet Take 1 tablet (20 mg total) by mouth daily. 90 tablet 3   fluocinonide (LIDEX) 0.05 % external solution APPLY TO AFFECTED AREA TWICE A DAY  60 mL 1   fluocinonide ointment (LIDEX) 0.05 % APPLY 1 APPLICATION TOPICALLY 2 (TWO) TIMES DAILY. APPLY TO KNEE AS DIRECTED 60 g 0   triamcinolone (NASACORT) 55 MCG/ACT AERO nasal inhaler Place 2 sprays into the nose daily. 1 each 12   triamcinolone cream (KENALOG) 0.1 % APPLY TO AFFECTED AREA TWICE A DAY 30 g 0   No current facility-administered medications on file prior to visit.        ROS:  All others reviewed and negative.  Objective        PE:  BP 118/70 (BP Location: Right Arm, Patient Position: Sitting, Cuff Size: Large)    Pulse 82    Temp 98.7 F (37.1 C) (Oral)    Ht 5\' 1"  (1.549 m)    Wt  281 lb (127.5 kg)    SpO2 96%    BMI 53.09 kg/m                 Constitutional: Pt appears in NAD               HENT: Head: NCAT.                Right Ear: External ear normal.                 Left Ear: External ear normal. Bilateal canals with wax impactions               Eyes: . Pupils are equal, round, and reactive to light. Conjunctivae and EOM are normal except for mild clearish d/c bilateral               Nose: without d/c or deformity               Neck: Neck supple. Gross normal ROM               Cardiovascular: Normal rate and regular rhythm.                 Pulmonary/Chest: Effort normal and breath sounds without rales or wheezing.                Abd:  Soft, NT, ND, + BS, no organomegaly               Neurological: Pt is alert. At baseline orientation, motor grossly intact               Skin: Skin is warm. No rashes, no other new lesions, LE edema - none               Psychiatric: Pt behavior is normal without agitation , mild nervous  Micro: none  Cardiac tracings I have personally interpreted today:  none  Pertinent Radiological findings (summarize): none   Lab Results  Component Value Date   WBC 6.4 08/23/2021   HGB 13.7 08/23/2021   HCT 42.7 08/23/2021   PLT 275.0 08/23/2021   GLUCOSE 94 08/23/2021   CHOL 182 08/23/2021   TRIG 113.0 08/23/2021   HDL 39.70 08/23/2021   LDLCALC 120 (H) 08/23/2021   ALT 12 08/23/2021   AST 14 08/23/2021   NA 144 08/23/2021   K 4.0 08/23/2021   CL 105 08/23/2021   CREATININE 0.80 08/23/2021   BUN 9 08/23/2021   CO2 31 08/23/2021   TSH 1.49 08/23/2021   HGBA1C 6.0 08/23/2021   Assessment/Plan:  Scott Curtis is a 31 y.o. Black or African American [2] male with  has a  past medical history of Development delay (09/14/2011), Impaired glucose tolerance (09/24/2012), Mental retardation (09/14/2011), Obesity (09/14/2011), and Seizure disorder (HCC) (09/14/2011).  Vitamin D deficiency Last vitamin D Lab Results  Component Value Date    VD25OH 7.17 (L) 08/23/2021   Low, to start oral replacement   B12 deficiency Lab Results  Component Value Date   VITAMINB12 196 (L) 08/23/2021   Low, to start oral replacement - b12 1000 mcg qd   Encounter for well adult exam with abnormal findings Age and sex appropriate education and counseling updated with regular exercise and diet Referrals for preventative services - declines hep c screen Immunizations addressed - declines flu shot, covid booster Smoking counseling  - none needed Evidence for depression or other mood disorder - chronic anxiety agiation stable per mother Most recent labs reviewed. I have personally reviewed and have noted: 1) the patient's medical and social history 2) The patient's current medications and supplements 3) The patient's height, weight, and BMI have been recorded in the chart   Irritability and anger Overall stable, cont current lexapro, mother declines need for change in tx or psychiatry  Allergic conjunctivitis Mild worsening, for optivar asd,  to f/u any worsening symptoms or concerns  Chronic mycotic otitis externa Also for ENT f/u,  to f/u any worsening symptoms or concerns  Hyperglycemia Lab Results  Component Value Date   HGBA1C 6.0 08/23/2021   Stable, pt to continue current medical treatment  - diet   Impacted ear wax With odd ear canals - for ENT referral for disimpaction  Impaired glucose tolerance Lab Results  Component Value Date   HGBA1C 6.0 08/23/2021   Stable, pt to continue current medical treatment  - diet  Followup: Return in about 6 months (around 02/20/2022).  Oliver BarreJames Cassie Shedlock, MD 08/26/2021 6:45 AM [facility]

## 2021-08-23 NOTE — Patient Instructions (Signed)
Please take all new medication as prescribed - the optivar for the eyes  Please continue all other medications as before, and refills have been done if requested.  Please have the pharmacy call with any other refills you may need.  Please continue your efforts at being more active, low cholesterol diet, and weight control.  You are otherwise up to date with prevention measures today.  Please keep your appointments with your specialists as you may have planned  You will be contacted regarding the referral for: ENT  Please go to the LAB at the blood drawing area for the tests to be done  You will be contacted by phone if any changes need to be made immediately.  Otherwise, you will receive a letter about your results with an explanation, but please check with MyChart first.  Please remember to sign up for MyChart if you have not done so, as this will be important to you in the future with finding out test results, communicating by private email, and scheduling acute appointments online when needed.  Please make an Appointment to return in 6 months, or sooner if needed

## 2021-08-23 NOTE — Assessment & Plan Note (Signed)
Lab Results  Component Value Date   VITAMINB12 196 (L) 08/23/2021   Low, to start oral replacement - b12 1000 mcg qd  

## 2021-08-23 NOTE — Assessment & Plan Note (Signed)
Last vitamin D Lab Results  Component Value Date   VD25OH 7.17 (L) 08/23/2021   Low, to start oral replacement  

## 2021-08-26 ENCOUNTER — Encounter: Payer: Self-pay | Admitting: Internal Medicine

## 2021-08-26 NOTE — Assessment & Plan Note (Signed)
Lab Results  °Component Value Date  ° HGBA1C 6.0 08/23/2021  ° °Stable, pt to continue current medical treatment  - diet ° °

## 2021-08-26 NOTE — Assessment & Plan Note (Signed)
Mild worsening, for optivar asd,  to f/u any worsening symptoms or concerns

## 2021-08-26 NOTE — Assessment & Plan Note (Signed)
Also for ENT f/u,  to f/u any worsening symptoms or concerns

## 2021-08-26 NOTE — Assessment & Plan Note (Signed)
Age and sex appropriate education and counseling updated with regular exercise and diet Referrals for preventative services - declines hep c screen Immunizations addressed - declines flu shot, covid booster Smoking counseling  - none needed Evidence for depression or other mood disorder - chronic anxiety agiation stable per mother Most recent labs reviewed. I have personally reviewed and have noted: 1) the patient's medical and social history 2) The patient's current medications and supplements 3) The patient's height, weight, and BMI have been recorded in the chart

## 2021-08-26 NOTE — Assessment & Plan Note (Signed)
With odd ear canals - for ENT referral for disimpaction

## 2021-08-26 NOTE — Assessment & Plan Note (Signed)
Overall stable, cont current lexapro, mother declines need for change in tx or psychiatry

## 2021-10-29 DIAGNOSIS — G4733 Obstructive sleep apnea (adult) (pediatric): Secondary | ICD-10-CM | POA: Diagnosis not present

## 2021-11-06 ENCOUNTER — Other Ambulatory Visit: Payer: Self-pay | Admitting: Internal Medicine

## 2021-11-06 NOTE — Telephone Encounter (Signed)
Please refill as per office routine med refill policy (all routine meds to be refilled for 3 mo or monthly (per pt preference) up to one year from last visit, then month to month grace period for 3 mo, then further med refills will have to be denied) ? ?

## 2021-11-07 ENCOUNTER — Telehealth: Payer: Self-pay | Admitting: Internal Medicine

## 2021-11-07 MED ORDER — ESCITALOPRAM OXALATE 20 MG PO TABS: 20.0000 mg | ORAL_TABLET | Freq: Every day | ORAL | 3 refills | Status: DC

## 2021-11-07 NOTE — Telephone Encounter (Signed)
1.Medication Requested: escitalopram (LEXAPRO) 20 MG tablet ? ?2. Pharmacy (Name, Street, [place]):  ?[id number] [place], [place] - [address] Phone:  [phone]  ?   ?  ? ? ?3. On Med List:Y ? ?4. Last Visit with PCP: 08-23-2021 ? ?5. Next visit date with PCP: 02-21-2022 ? ? ?Agent: Please be advised that RX refills may take up to 3 business days. We ask that you follow-up with your pharmacy.  ?

## 2021-12-06 ENCOUNTER — Ambulatory Visit: Payer: Medicare Other | Admitting: Internal Medicine

## 2021-12-07 ENCOUNTER — Ambulatory Visit: Payer: Medicare Other | Admitting: Internal Medicine

## 2021-12-25 DIAGNOSIS — H6123 Impacted cerumen, bilateral: Secondary | ICD-10-CM | POA: Diagnosis not present

## 2022-01-18 ENCOUNTER — Ambulatory Visit (INDEPENDENT_AMBULATORY_CARE_PROVIDER_SITE_OTHER): Payer: Medicare Other

## 2022-01-18 ENCOUNTER — Encounter: Payer: Self-pay | Admitting: Internal Medicine

## 2022-01-18 ENCOUNTER — Telehealth: Payer: Medicare Other | Admitting: Internal Medicine

## 2022-01-18 ENCOUNTER — Ambulatory Visit (INDEPENDENT_AMBULATORY_CARE_PROVIDER_SITE_OTHER): Payer: Medicare Other | Admitting: Internal Medicine

## 2022-01-18 VITALS — BP 118/80 | HR 75 | Temp 98.7°F | Ht 61.0 in | Wt 286.0 lb

## 2022-01-18 DIAGNOSIS — R059 Cough, unspecified: Secondary | ICD-10-CM | POA: Diagnosis not present

## 2022-01-18 DIAGNOSIS — G40909 Epilepsy, unspecified, not intractable, without status epilepticus: Secondary | ICD-10-CM

## 2022-01-18 DIAGNOSIS — R051 Acute cough: Secondary | ICD-10-CM

## 2022-01-18 DIAGNOSIS — R7302 Impaired glucose tolerance (oral): Secondary | ICD-10-CM | POA: Diagnosis not present

## 2022-01-18 DIAGNOSIS — E559 Vitamin D deficiency, unspecified: Secondary | ICD-10-CM | POA: Diagnosis not present

## 2022-01-18 DIAGNOSIS — R509 Fever, unspecified: Secondary | ICD-10-CM | POA: Diagnosis not present

## 2022-01-18 MED ORDER — HYDROCODONE BIT-HOMATROP MBR 5-1.5 MG/5ML PO SOLN: 5.0000 mL | Freq: Four times a day (QID) | ORAL | 0 refills | Status: AC | PRN

## 2022-01-18 MED ORDER — DOXYCYCLINE HYCLATE 100 MG PO TABS: 100.0000 mg | ORAL_TABLET | Freq: Two times a day (BID) | ORAL | 0 refills | Status: DC

## 2022-01-18 NOTE — Patient Instructions (Signed)
Please take all new medication as prescribed   - the cough medicine, and antibiotic  Please continue all other medications as before, and refills have been done if requested.  Please have the pharmacy call with any other refills you may need.  Please continue your efforts at being more active, low cholesterol diet, and weight control.  Please keep your appointments with your specialists as you may have planned  Please go to the XRAY Department in the first floor for the x-ray testing  You will be contacted by phone if any changes need to be made immediately.  Otherwise, you will receive a letter about your results with an explanation, but please check with MyChart first.  Please remember to sign up for MyChart if you have not done so, as this will be important to you in the future with finding out test results, communicating by private email, and scheduling acute appointments online when needed.

## 2022-01-18 NOTE — Progress Notes (Signed)
Patient ID: Scott Curtis, male   DOB: Mar 12, 1991, 31 y.o.   MRN: [id number]        Chief Complaint: follow up cough and fever       HPI:  Scott Curtis is a 31 y.o. male Here with mother with acute onset mild to mod 2-3 days ST, HA, general weakness and malaise, with prod cough greenish sputum, but Pt denies chest pain, increased sob or doe, wheezing, orthopnea, PND, increased LE swelling, palpitations, dizziness or syncope.  No recent siezure or known aspiration.   Pt denies polydipsia, polyuria, or new focal neuro s/s.  Not taking Vit D       Wt Readings from Last 3 Encounters:  01/18/22 286 lb (129.7 kg)  08/23/21 281 lb (127.5 kg)  12/27/20 290 lb (131.5 kg)   BP Readings from Last 3 Encounters:  01/18/22 118/80  08/23/21 118/70  12/27/20 114/70         Past Medical History:  Diagnosis Date   Development delay 09/14/2011   Impaired glucose tolerance 09/24/2012   Mental retardation 09/14/2011   Obesity 09/14/2011   Seizure disorder (HCC) 09/14/2011   Past Surgical History:  Procedure Laterality Date   NO PAST SURGERIES      reports that he has never smoked. He has never used smokeless tobacco. He reports that he does not drink alcohol and does not use drugs. family history includes Arthritis in an other family member; Cancer in an other family member; Hypertension in an other family member. Allergies  Allergen Reactions   Codeine Hives   Penicillin G Rash    Has patient had a PCN reaction causing immediate rash, facial/tongue/throat swelling, SOB or lightheadedness with hypotension: Unknown Has patient had a PCN reaction causing severe rash involving mucus membranes or skin necrosis: No Has patient had a PCN reaction that required hospitalization: No Has patient had a PCN reaction occurring within the last 10 years: No If all of the above answers are "NO", then may proceed with Cephalosporin use.    Current Outpatient Medications on File Prior to Visit  Medication Sig  Dispense Refill   albuterol (VENTOLIN HFA) 108 (90 Base) MCG/ACT inhaler TAKE 2 PUFFS BY MOUTH EVERY 6 HOURS AS NEEDED FOR WHEEZE OR SHORTNESS OF BREATH 8.5 each 3   azelastine (OPTIVAR) 0.05 % ophthalmic solution Place 2 drops into both eyes 2 (two) times daily. 6 mL 12   Cholecalciferol 50 MCG (2000 UT) TABS 1 tab by mouth once daily 30 tablet 99   clotrimazole-betamethasone (LOTRISONE) cream Use as directed twice per day as needed to affected area 45 g 2   escitalopram (LEXAPRO) 20 MG tablet Take 1 tablet (20 mg total) by mouth daily. 90 tablet 3   fluocinonide (LIDEX) 0.05 % external solution APPLY TO AFFECTED AREA TWICE A DAY 60 mL 1   fluocinonide ointment (LIDEX) 0.05 % APPLY 1 APPLICATION TOPICALLY 2 (TWO) TIMES DAILY. APPLY TO KNEE AS DIRECTED 60 g 0   triamcinolone (NASACORT) 55 MCG/ACT AERO nasal inhaler Place 2 sprays into the nose daily. 1 each 12   triamcinolone cream (KENALOG) 0.1 % APPLY TO AFFECTED AREA TWICE A DAY 30 g 0   vitamin B-12 (CYANOCOBALAMIN) 1000 MCG tablet Take 1 tablet (1,000 mcg total) by mouth daily. 90 tablet 3   No current facility-administered medications on file prior to visit.        ROS:  All others reviewed and negative.  Objective  PE:  BP 118/80 (BP Location: Right Arm, Patient Position: Sitting, Cuff Size: Large)   Pulse 75   Temp 98.7 F (37.1 C) (Oral)   Ht 5\' 1"  (1.549 m)   Wt 286 lb (129.7 kg)   SpO2 96%   BMI 54.04 kg/m                 Constitutional: Pt appears in NAD, mild ill               HENT: Head: NCAT.                Right Ear: External ear normal.                 Left Ear: External ear normal. Bilat tm's with mild erythema.  Max sinus areas non tender.  Pharynx with mild erythema, no exudate               Eyes: . Pupils are equal, round, and reactive to light. Conjunctivae and EOM are normal               Nose: without d/c or deformity               Neck: Neck supple. Gross normal ROM               Cardiovascular:  Normal rate and regular rhythm.                 Pulmonary/Chest: Effort normal and breath sounds decreased without rales or wheezing.                Abd:  Soft, NT, ND, + BS, no organomegaly               Neurological: Pt is alert. At baseline orientation, motor grossly intact               Skin: Skin is warm. No rashes, no other new lesions, LE edema - none               Psychiatric: Pt behavior is normal without agitation   Micro: none  Cardiac tracings I have personally interpreted today:  none  Pertinent Radiological findings (summarize): none   Lab Results  Component Value Date   WBC 6.4 08/23/2021   HGB 13.7 08/23/2021   HCT 42.7 08/23/2021   PLT 275.0 08/23/2021   GLUCOSE 94 08/23/2021   CHOL 182 08/23/2021   TRIG 113.0 08/23/2021   HDL 39.70 08/23/2021   LDLCALC 120 (H) 08/23/2021   ALT 12 08/23/2021   AST 14 08/23/2021   NA 144 08/23/2021   K 4.0 08/23/2021   CL 105 08/23/2021   CREATININE 0.80 08/23/2021   BUN 9 08/23/2021   CO2 31 08/23/2021   TSH 1.49 08/23/2021   HGBA1C 6.0 08/23/2021   Assessment/Plan:  Scott Curtis is a 31 y.o. Black or African American [2] male with  has a past medical history of Development delay (09/14/2011), Impaired glucose tolerance (09/24/2012), Mental retardation (09/14/2011), Obesity (09/14/2011), and Seizure disorder (HCC) (09/14/2011).  Vitamin D deficiency Last vitamin D Lab Results  Component Value Date   VD25OH 7.17 (L) 08/23/2021   Low, to start oral replacement  Impaired glucose tolerance Lab Results  Component Value Date   HGBA1C 6.0 08/23/2021   Stable, pt to continue current medical treatment  - diet, wt control, excercise   Cough Acute onset, cant r/o bronchitis vs pna, for cxr, and antibx doxycycline course, cough  med prn,  to f/u any worsening symptoms or concerns  Followup: Return if symptoms worsen or fail to improve.  [name], MD 01/20/2022 6:46 PM [facility] [place] [facility] - [facility]

## 2022-01-20 ENCOUNTER — Encounter: Payer: Self-pay | Admitting: Internal Medicine

## 2022-01-20 NOTE — Assessment & Plan Note (Signed)
Last vitamin D Lab Results  Component Value Date   VD25OH 7.17 (L) 08/23/2021   Low, to start oral replacement  

## 2022-01-20 NOTE — Assessment & Plan Note (Signed)
Acute onset, cant r/o bronchitis vs pna, for cxr, and antibx doxycycline course, cough med prn,  to f/u any worsening symptoms or concerns

## 2022-01-20 NOTE — Assessment & Plan Note (Signed)
Lab Results  Component Value Date   HGBA1C 6.0 08/23/2021   Stable, pt to continue current medical treatment  - diet, wt control, excercise

## 2022-02-21 ENCOUNTER — Ambulatory Visit (INDEPENDENT_AMBULATORY_CARE_PROVIDER_SITE_OTHER): Payer: Medicare Other | Admitting: Internal Medicine

## 2022-02-21 ENCOUNTER — Encounter: Payer: Self-pay | Admitting: Internal Medicine

## 2022-02-21 VITALS — BP 118/68 | HR 70 | Temp 98.6°F | Ht 61.0 in | Wt 285.0 lb

## 2022-02-21 DIAGNOSIS — B369 Superficial mycosis, unspecified: Secondary | ICD-10-CM | POA: Diagnosis not present

## 2022-02-21 DIAGNOSIS — H6240 Otitis externa in other diseases classified elsewhere, unspecified ear: Secondary | ICD-10-CM

## 2022-02-21 DIAGNOSIS — E538 Deficiency of other specified B group vitamins: Secondary | ICD-10-CM | POA: Diagnosis not present

## 2022-02-21 DIAGNOSIS — R739 Hyperglycemia, unspecified: Secondary | ICD-10-CM | POA: Diagnosis not present

## 2022-02-21 DIAGNOSIS — E559 Vitamin D deficiency, unspecified: Secondary | ICD-10-CM

## 2022-02-21 NOTE — Patient Instructions (Signed)
Please take OTC Vitamin D3 at 2000 units per day, indefinitely  Please also take B12 1000 mcg per day  Please continue all other medications as before, and refills have been done if requested.  Please have the pharmacy call with any other refills you may need.  Please continue your efforts at being more active, low cholesterol diet, and weight control.  You are otherwise up to date with prevention measures today.  Please keep your appointments with your specialists as you may have planned  Please make an Appointment to return in 6 months, or sooner if needed

## 2022-02-21 NOTE — Progress Notes (Signed)
Patient ID: AVONTE SENSABAUGH, male   DOB: [birth year], 31 y.o.   MRN: [id number]        Chief Complaint: follow up low vit d and b12, obesity, hyperglycemia, ear itching       HPI:  SHAHID FLORI is a 31 y.o. male here overall doing ok, but still with significant era itching, though rash much improved; Not taking b12 r Vit D.  Spends much time on phone screen and games, steady gaining wt overall.  Good compliance with CPAP and less sleep during the day.  Pt denies chest pain, increased sob or doe, wheezing, orthopnea, PND, increased LE swelling, palpitations, dizziness or syncope.   Pt denies polydipsia, polyuria, or new focal neuro s/s.    Pt denies fever, wt loss, night sweats, loss of appetite, or other constitutional symptoms         Wt Readings from Last 3 Encounters:  02/21/22 285 lb (129.3 kg)  01/18/22 286 lb (129.7 kg)  08/23/21 281 lb (127.5 kg)   BP Readings from Last 3 Encounters:  02/21/22 118/68  01/18/22 118/80  08/23/21 118/70         Past Medical History:  Diagnosis Date   Development delay 09/14/2011   Impaired glucose tolerance 09/24/2012   Mental retardation 09/14/2011   Obesity 09/14/2011   Seizure disorder (HCC) 09/14/2011   Past Surgical History:  Procedure Laterality Date   NO PAST SURGERIES      reports that he has never smoked. He has never used smokeless tobacco. He reports that he does not drink alcohol and does not use drugs. family history includes Arthritis in an other family member; Cancer in an other family member; Hypertension in an other family member. Allergies  Allergen Reactions   Codeine Hives   Penicillin G Rash    Has patient had a PCN reaction causing immediate rash, facial/tongue/throat swelling, SOB or lightheadedness with hypotension: Unknown Has patient had a PCN reaction causing severe rash involving mucus membranes or skin necrosis: No Has patient had a PCN reaction that required hospitalization: No Has patient had a PCN reaction occurring  within the last 10 years: No If all of the above answers are "NO", then may proceed with Cephalosporin use.    Current Outpatient Medications on File Prior to Visit  Medication Sig Dispense Refill   albuterol (VENTOLIN HFA) 108 (90 Base) MCG/ACT inhaler TAKE 2 PUFFS BY MOUTH EVERY 6 HOURS AS NEEDED FOR WHEEZE OR SHORTNESS OF BREATH 8.5 each 3   azelastine (OPTIVAR) 0.05 % ophthalmic solution Place 2 drops into both eyes 2 (two) times daily. 6 mL 12   Cholecalciferol 50 MCG (2000 UT) TABS 1 tab by mouth once daily 30 tablet 99   clotrimazole-betamethasone (LOTRISONE) cream Use as directed twice per day as needed to affected area 45 g 2   escitalopram (LEXAPRO) 20 MG tablet Take 1 tablet (20 mg total) by mouth daily. 90 tablet 3   fluocinonide (LIDEX) 0.05 % external solution APPLY TO AFFECTED AREA TWICE A DAY 60 mL 1   fluocinonide ointment (LIDEX) 0.05 % APPLY 1 APPLICATION TOPICALLY 2 (TWO) TIMES DAILY. APPLY TO KNEE AS DIRECTED 60 g 0   triamcinolone (NASACORT) 55 MCG/ACT AERO nasal inhaler Place 2 sprays into the nose daily. 1 each 12   triamcinolone cream (KENALOG) 0.1 % APPLY TO AFFECTED AREA TWICE A DAY 30 g 0   vitamin B-12 (CYANOCOBALAMIN) 1000 MCG tablet Take 1 tablet (1,000 mcg total) by mouth daily. 90  tablet 3   No current facility-administered medications on file prior to visit.        ROS:  All others reviewed and negative.  Objective        PE:  BP 118/68 (BP Location: Left Arm, Patient Position: Sitting, Cuff Size: Large)   Pulse 70   Temp 98.6 F (37 C) (Oral)   Ht 5\' 1"  (1.549 m)   Wt 285 lb (129.3 kg)   SpO2 98%   BMI 53.85 kg/m                 Constitutional: Pt appears in NAD               HENT: Head: NCAT.                Right Ear: External ear normal.                 Left Ear: External ear normal.                Eyes: . Pupils are equal, round, and reactive to light. Conjunctivae and EOM are normal               Nose: without d/c or deformity                Neck: Neck supple. Gross normal ROM               Cardiovascular: Normal rate and regular rhythm.                 Pulmonary/Chest: Effort normal and breath sounds without rales or wheezing.                Abd:  Soft, NT, ND, + BS, no organomegaly               Neurological: Pt is alert. At baseline orientation, motor grossly intact               Skin: Skin is warm. No rashes, no other new lesions, LE edema - none               Psychiatric: Pt behavior is normal without agitation   Micro: none  Cardiac tracings I have personally interpreted today:  none  Pertinent Radiological findings (summarize): none   Lab Results  Component Value Date   WBC 6.4 08/23/2021   HGB 13.7 08/23/2021   HCT 42.7 08/23/2021   PLT 275.0 08/23/2021   GLUCOSE 94 08/23/2021   CHOL 182 08/23/2021   TRIG 113.0 08/23/2021   HDL 39.70 08/23/2021   LDLCALC 120 (H) 08/23/2021   ALT 12 08/23/2021   AST 14 08/23/2021   NA 144 08/23/2021   K 4.0 08/23/2021   CL 105 08/23/2021   CREATININE 0.80 08/23/2021   BUN 9 08/23/2021   CO2 31 08/23/2021   TSH 1.49 08/23/2021   HGBA1C 6.0 08/23/2021   Assessment/Plan:  ANANIAS KOLANDER is a 31 y.o. Black or African American [2] male with  has a past medical history of Development delay (09/14/2011), Impaired glucose tolerance (09/24/2012), Mental retardation (09/14/2011), Obesity (09/14/2011), and Seizure disorder (HCC) (09/14/2011).  Obesity D/w mother - needs wt loss with less calories and more activity, consider rybelsus  3 mg qd but decliens for now  Chronic mycotic otitis externa Exam benign but still with itching - for otc benadryl cream topical prn itch  Hyperglycemia Lab Results  Component Value Date   HGBA1C 6.0 08/23/2021  Stable, pt to continue current medical treatment   - diet, wt control, activity   B12 deficiency Lab Results  Component Value Date   VITAMINB12 196 (L) 08/23/2021   Low, to start oral replacement - b12 1000 mcg qd   Vitamin D  deficiency Last vitamin D Lab Results  Component Value Date   VD25OH 7.17 (L) 08/23/2021   Very low, to start oral replacement  Followup: Return in about 6 months (around 08/24/2022).  [name], MD 02/24/2022 9:29 AM [facility] [place] [facility] - [facility]

## 2022-02-24 ENCOUNTER — Encounter: Payer: Self-pay | Admitting: Internal Medicine

## 2022-02-24 NOTE — Assessment & Plan Note (Signed)
D/w mother - needs wt loss with less calories and more activity, consider rybelsus  3 mg qd but decliens for now

## 2022-02-24 NOTE — Assessment & Plan Note (Signed)
Lab Results  Component Value Date   HGBA1C 6.0 08/23/2021   Stable, pt to continue current medical treatment   - diet, wt control, activity

## 2022-02-24 NOTE — Assessment & Plan Note (Signed)
Last vitamin D Lab Results  Component Value Date   VD25OH 7.17 (L) 08/23/2021   Very low, to start oral replacement

## 2022-02-24 NOTE — Assessment & Plan Note (Signed)
Exam benign but still with itching - for otc benadryl cream topical prn itch

## 2022-02-24 NOTE — Assessment & Plan Note (Signed)
Lab Results  Component Value Date   VITAMINB12 196 (L) 08/23/2021   Low, to start oral replacement - b12 1000 mcg qd  

## 2022-03-14 ENCOUNTER — Telehealth: Payer: Self-pay

## 2022-03-14 MED ORDER — AZITHROMYCIN 250 MG PO TABS: ORAL_TABLET | ORAL | 1 refills | Status: AC

## 2022-03-14 NOTE — Telephone Encounter (Signed)
Pt mother is currently experiencing a sinus infection and was wondering if Dr. Jonny Ruiz is willing to send in a abx to treat the pt for the same thing as he is experiencing stuffy nose, low grade fever.  Pharmacy: [id number] [place], [place] - [address]  [place] 02/21/22

## 2022-03-14 NOTE — Telephone Encounter (Signed)
Ok for z pack

## 2022-06-03 ENCOUNTER — Ambulatory Visit (INDEPENDENT_AMBULATORY_CARE_PROVIDER_SITE_OTHER): Payer: Medicare Other | Admitting: Internal Medicine

## 2022-06-03 ENCOUNTER — Encounter: Payer: Self-pay | Admitting: Internal Medicine

## 2022-06-03 VITALS — BP 116/68 | HR 88 | Temp 98.3°F | Ht 61.0 in | Wt 294.5 lb

## 2022-06-03 DIAGNOSIS — E559 Vitamin D deficiency, unspecified: Secondary | ICD-10-CM | POA: Diagnosis not present

## 2022-06-03 DIAGNOSIS — E538 Deficiency of other specified B group vitamins: Secondary | ICD-10-CM | POA: Diagnosis not present

## 2022-06-03 DIAGNOSIS — N62 Hypertrophy of breast: Secondary | ICD-10-CM | POA: Diagnosis not present

## 2022-06-03 DIAGNOSIS — J301 Allergic rhinitis due to pollen: Secondary | ICD-10-CM | POA: Diagnosis not present

## 2022-06-03 DIAGNOSIS — H1013 Acute atopic conjunctivitis, bilateral: Secondary | ICD-10-CM | POA: Diagnosis not present

## 2022-06-03 LAB — VITAMIN D 25 HYDROXY (VIT D DEFICIENCY, FRACTURES): VITD: 8.28 ng/mL — ABNORMAL LOW (ref 30.00–100.00)

## 2022-06-03 LAB — TESTOSTERONE: Testosterone: 115.08 ng/dL — ABNORMAL LOW (ref 300.00–890.00)

## 2022-06-03 LAB — VITAMIN B12: Vitamin B-12: 1283 pg/mL — ABNORMAL HIGH (ref 211–911)

## 2022-06-03 MED ORDER — AZELASTINE HCL 0.05 % OP SOLN: 2.0000 [drp] | Freq: Two times a day (BID) | OPHTHALMIC | 12 refills | Status: DC

## 2022-06-03 MED ORDER — METHYLPREDNISOLONE ACETATE 80 MG/ML IJ SUSP
80.0000 mg | Freq: Once | INTRAMUSCULAR | Status: AC
  Administered 2022-06-03: 80 mg via INTRAMUSCULAR

## 2022-06-03 NOTE — Assessment & Plan Note (Signed)
Last vitamin D Lab Results  Component Value Date   VD25OH 7.17 (L) 08/23/2021   Low, to start oral replacement

## 2022-06-03 NOTE — Progress Notes (Signed)
Patient ID: Scott Curtis, male   DOB: [birth year], 31 y.o.   MRN: [id number]        Chief Complaint: follow up with mother with left breast pain and swelling, eye itching and allergy, nasal congestion, lo vit d, and low b12       HPI:  Scott Curtis is a 31 y.o. male Does have several wks ongoing eye and nasal allergy symptoms with clearish congestion, itch and sneezing, without fever, pain, ST, cough, swelling or wheezing  Not taking B12 or Vit D.  Also with 3 wks worsening left breast pain and swelling, without trauma, fever or skin changes.         Wt Readings from Last 3 Encounters:  06/03/22 294 lb 8 oz (133.6 kg)  02/21/22 285 lb (129.3 kg)  01/18/22 286 lb (129.7 kg)   BP Readings from Last 3 Encounters:  06/03/22 116/68  02/21/22 118/68  01/18/22 118/80         Past Medical History:  Diagnosis Date   Development delay 09/14/2011   Impaired glucose tolerance 09/24/2012   Mental retardation 09/14/2011   Obesity 09/14/2011   Seizure disorder (HCC) 09/14/2011   Past Surgical History:  Procedure Laterality Date   NO PAST SURGERIES      reports that he has never smoked. He has never used smokeless tobacco. He reports that he does not drink alcohol and does not use drugs. family history includes Arthritis in an other family member; Cancer in an other family member; Hypertension in an other family member. Allergies  Allergen Reactions   Codeine Hives   Penicillin G Rash    Has patient had a PCN reaction causing immediate rash, facial/tongue/throat swelling, SOB or lightheadedness with hypotension: Unknown Has patient had a PCN reaction causing severe rash involving mucus membranes or skin necrosis: No Has patient had a PCN reaction that required hospitalization: No Has patient had a PCN reaction occurring within the last 10 years: No If all of the above answers are "NO", then may proceed with Cephalosporin use.    Current Outpatient Medications on File Prior to Visit   Medication Sig Dispense Refill   albuterol (VENTOLIN HFA) 108 (90 Base) MCG/ACT inhaler TAKE 2 PUFFS BY MOUTH EVERY 6 HOURS AS NEEDED FOR WHEEZE OR SHORTNESS OF BREATH 8.5 each 3   Cholecalciferol 50 MCG (2000 UT) TABS 1 tab by mouth once daily 30 tablet 99   clotrimazole-betamethasone (LOTRISONE) cream Use as directed twice per day as needed to affected area 45 g 2   escitalopram (LEXAPRO) 20 MG tablet Take 1 tablet (20 mg total) by mouth daily. 90 tablet 3   fluocinonide (LIDEX) 0.05 % external solution APPLY TO AFFECTED AREA TWICE A DAY 60 mL 1   fluocinonide ointment (LIDEX) 0.05 % APPLY 1 APPLICATION TOPICALLY 2 (TWO) TIMES DAILY. APPLY TO KNEE AS DIRECTED 60 g 0   triamcinolone (NASACORT) 55 MCG/ACT AERO nasal inhaler Place 2 sprays into the nose daily. 1 each 12   triamcinolone cream (KENALOG) 0.1 % APPLY TO AFFECTED AREA TWICE A DAY 30 g 0   vitamin B-12 (CYANOCOBALAMIN) 1000 MCG tablet Take 1 tablet (1,000 mcg total) by mouth daily. 90 tablet 3   No current facility-administered medications on file prior to visit.        ROS:  All others reviewed and negative.  Objective        PE:  BP 116/68   Pulse 88   Temp 98.3 F (36.8 C) (  Oral)   Ht 5\' 1"  (1.549 m)   Wt 294 lb 8 oz (133.6 kg)   SpO2 96%   BMI 55.65 kg/m                 Constitutional: Pt appears in NAD               HENT: Head: NCAT.                Right Ear: External ear normal.                 Left Ear: External ear normal.  Bilat tm's with mild erythema.  Max sinus areas non tender.  Pharynx with mild erythema, no exudate                Eyes: . Pupils are equal, round, and reactive to light. Conjunctivae and EOM are normal               Nose: without d/c or deformity               Neck: Neck supple. Gross normal ROM               Cardiovascular: Normal rate and regular rhythm.                 Pulmonary/Chest: Effort normal and breath sounds without rales or wheezing.                Abd:  Soft, NT, ND, + BS, no  organomegaly                Left breast with tender fatty tissue , no mass, c/w gynecomastia               Neurological: Pt is alert. At baseline orientation, motor grossly intact               Skin: Skin is warm. No rashes, no other new lesions, LE edema - none               Psychiatric: Pt behavior is normal without agitation   Micro: none  Cardiac tracings I have personally interpreted today:  none  Pertinent Radiological findings (summarize): none   Lab Results  Component Value Date   WBC 6.4 08/23/2021   HGB 13.7 08/23/2021   HCT 42.7 08/23/2021   PLT 275.0 08/23/2021   GLUCOSE 94 08/23/2021   CHOL 182 08/23/2021   TRIG 113.0 08/23/2021   HDL 39.70 08/23/2021   LDLCALC 120 (H) 08/23/2021   ALT 12 08/23/2021   AST 14 08/23/2021   NA 144 08/23/2021   K 4.0 08/23/2021   CL 105 08/23/2021   CREATININE 0.80 08/23/2021   BUN 9 08/23/2021   CO2 31 08/23/2021   TSH 1.49 08/23/2021   HGBA1C 6.0 08/23/2021   Assessment/Plan:  Scott Curtis is a 31 y.o. Black or African American [2] male with  has a past medical history of Development delay (09/14/2011), Impaired glucose tolerance (09/24/2012), Mental retardation (09/14/2011), Obesity (09/14/2011), and Seizure disorder (HCC) (09/14/2011).  Vitamin D deficiency Last vitamin D Lab Results  Component Value Date   VD25OH 7.17 (L) 08/23/2021   Low, to start oral replacement   B12 deficiency Lab Results  Component Value Date   VITAMINB12 196 (L) 08/23/2021   Low, to start oral replacement - b12 1000 mcg qd   Allergic conjunctivitis Mild to mod, for optivar asd, to f/u any worsening symptoms or concerns  Allergic rhinitis Mild to mod likely seasonal flare,  for depomedrol IM 80 mg,  to f/u any worsening symptoms or concerns   Gynecomastia, male Recent onset, doubt drug related, can't r/o low testosterone - for lab today, mother declines endo referral for now  Followup: Return in about 3 months (around  09/03/2022).  [name], MD 06/03/2022 12:40 PM [facility]

## 2022-06-03 NOTE — Assessment & Plan Note (Signed)
Mild to mod, for optivar asd,  to f/u any worsening symptoms or concerns 

## 2022-06-03 NOTE — Assessment & Plan Note (Addendum)
Recent onset, doubt drug related, can't r/o low testosterone - for lab today, mother declines endo referral for now

## 2022-06-03 NOTE — Addendum Note (Signed)
Addended by: Cathleen Fears, Azarya Oconnell P on: 06/03/2022 01:45 PM   Modules accepted: Orders

## 2022-06-03 NOTE — Assessment & Plan Note (Signed)
Lab Results  Component Value Date   VITAMINB12 196 (L) 08/23/2021   Low, to start oral replacement - b12 1000 mcg qd

## 2022-06-03 NOTE — Patient Instructions (Signed)
Please take all new medication as prescribed - the optivar for the eyes  You had the steroid shot today  Please continue all other medications as before, and refills have been done if requested.  Please have the pharmacy call with any other refills you may need.  Please continue your efforts at being more active, low cholesterol diet, and weight control.  You are otherwise up to date with prevention measures today.  Please keep your appointments with your specialists as you may have planned  Please go to the LAB at the blood drawing area for the tests to be done   You will be contacted by phone if any changes need to be made immediately.  Otherwise, you will receive a letter about your results with an explanation, but please check with MyChart first.  Please remember to sign up for MyChart if you have not done so, as this will be important to you in the future with finding out test results, communicating by private email, and scheduling acute appointments online when needed.  Please make an Appointment to return in 3 months, or sooner if needed

## 2022-06-03 NOTE — Assessment & Plan Note (Signed)
Mild to mod likely seasonal flare,  for depomedrol IM 80 mg,  to f/u any worsening symptoms or concerns

## 2022-06-09 NOTE — Progress Notes (Unsigned)
@Patient  ID: , male    DOB: 01-Apr-1991, 31 y.o.   MRN: [id number]  No chief complaint on file.   Referring provider: [id number], MD  HPI: 31 year old male. Never smoked. PMH significant for seizure disorder, OSA, elevated blood pressure, vit D deficiency, developmental delay. Patient of Dr. 26, last seen on 04/27/20.  Previous [facility] pulmonary encounter: 04/27/20- 29 yoM never smoker for OSA, Nocturnal Hypoxemia, complicated by  Allergic Rhinitis, Mental Retardation ( congenital syndrome), Seizure Disorder, Chronic Otitis, Chronic Sinusitis, Obesity Seborrheic Dermatitis, Asthma Mother  provides information. 06-10-2002 is non-verbal. CPAP auto 5-20/ [facility]   Desensitization mask fitting 11/10/19 Download- compliance 100%, AHI 37.4/ hr Body weight today- 291 lbs Covid vax- 2 Phizer Flu vax- declines Mother has not felt he would tolerate in-patient sleep study for BIPAP titration and confirmed this again today. Weight gain blamed on Lexapro. She says he rolls over a lot, dislodging mask and hose. We discussed high breakthrough apnea index. Given dificult situation and his limited ability to understand and communicate, we agreed to continue as we are doing.   06/10/2022- interim hx  Patient presents today for overdue OSA/CPAP follow-up. Patient has legal guardian.           Allergies  Allergen Reactions   Codeine Hives   Penicillin G Rash    Has patient had a PCN reaction causing immediate rash, facial/tongue/throat swelling, SOB or lightheadedness with hypotension: Unknown Has patient had a PCN reaction causing severe rash involving mucus membranes or skin necrosis: No Has patient had a PCN reaction that required hospitalization: No Has patient had a PCN reaction occurring within the last 10 years: No If all of the above answers are "NO", then may proceed with Cephalosporin use.     Immunization History  Administered Date(s) Administered   PFIZER(Purple  Top)SARS-COV-2 Vaccination 11/24/2019, 12/27/2019    Past Medical History:  Diagnosis Date   Development delay 09/14/2011   Impaired glucose tolerance 09/24/2012   Mental retardation 09/14/2011   Obesity 09/14/2011   Seizure disorder (HCC) 09/14/2011    Tobacco History: Social History   Tobacco Use  Smoking Status Never  Smokeless Tobacco Never   Counseling given: Not Answered   Outpatient Medications Prior to Visit  Medication Sig Dispense Refill   albuterol (VENTOLIN HFA) 108 (90 Base) MCG/ACT inhaler TAKE 2 PUFFS BY MOUTH EVERY 6 HOURS AS NEEDED FOR WHEEZE OR SHORTNESS OF BREATH 8.5 each 3   azelastine (OPTIVAR) 0.05 % ophthalmic solution Place 2 drops into both eyes 2 (two) times daily. 6 mL 12   Cholecalciferol 50 MCG (2000 UT) TABS 1 tab by mouth once daily 30 tablet 99   clotrimazole-betamethasone (LOTRISONE) cream Use as directed twice per day as needed to affected area 45 g 2   escitalopram (LEXAPRO) 20 MG tablet Take 1 tablet (20 mg total) by mouth daily. 90 tablet 3   fluocinonide (LIDEX) 0.05 % external solution APPLY TO AFFECTED AREA TWICE A DAY 60 mL 1   fluocinonide ointment (LIDEX) 0.05 % APPLY 1 APPLICATION TOPICALLY 2 (TWO) TIMES DAILY. APPLY TO KNEE AS DIRECTED 60 g 0   triamcinolone (NASACORT) 55 MCG/ACT AERO nasal inhaler Place 2 sprays into the nose daily. 1 each 12   triamcinolone cream (KENALOG) 0.1 % APPLY TO AFFECTED AREA TWICE A DAY 30 g 0   vitamin B-12 (CYANOCOBALAMIN) 1000 MCG tablet Take 1 tablet (1,000 mcg total) by mouth daily. 90 tablet 3   No facility-administered medications  prior to visit.      Review of Systems  Review of Systems   Physical Exam  There were no vitals taken for this visit. Physical Exam   Lab Results:  CBC    Component Value Date/Time   WBC 6.4 08/23/2021 0932   RBC 5.08 08/23/2021 0932   HGB 13.7 08/23/2021 0932   HCT 42.7 08/23/2021 0932   PLT 275.0 08/23/2021 0932   MCV 83.9 08/23/2021 0932   MCH 29.2  08/11/2017 2022   MCHC 32.2 08/23/2021 0932   RDW 15.1 08/23/2021 0932   LYMPHSABS 1.3 08/23/2021 0932   MONOABS 0.5 08/23/2021 0932   EOSABS 0.2 08/23/2021 0932   BASOSABS 0.0 08/23/2021 0932    BMET    Component Value Date/Time   NA 144 08/23/2021 0932   K 4.0 08/23/2021 0932   CL 105 08/23/2021 0932   CO2 31 08/23/2021 0932   GLUCOSE 94 08/23/2021 0932   BUN 9 08/23/2021 0932   CREATININE 0.80 08/23/2021 0932   CALCIUM 9.8 08/23/2021 0932   GFRNONAA >60 08/11/2017 2022   GFRAA >60 08/11/2017 2022    BNP No results found for: "BNP"  ProBNP No results found for: "PROBNP"  Imaging: No results found.   Assessment & Plan:   No problem-specific Assessment & Plan notes found for this encounter.     Glenford Bayley, NP 06/09/2022

## 2022-06-10 ENCOUNTER — Ambulatory Visit (INDEPENDENT_AMBULATORY_CARE_PROVIDER_SITE_OTHER): Payer: Medicare Other | Admitting: Primary Care

## 2022-06-10 ENCOUNTER — Encounter: Payer: Self-pay | Admitting: Primary Care

## 2022-06-10 VITALS — BP 126/80 | HR 79 | Temp 98.4°F | Ht 60.0 in | Wt 293.6 lb

## 2022-06-10 DIAGNOSIS — G4733 Obstructive sleep apnea (adult) (pediatric): Secondary | ICD-10-CM | POA: Diagnosis not present

## 2022-06-10 NOTE — Assessment & Plan Note (Signed)
-   Patient is 100% compliant with CPAP use over the last 30 days and reports benefit from wearing. He is sleeping well at night without residual daytime sleepiness. Current pressure 5-20cm h20; Residual AHI 25/hr. Large amount of airleaks. He is having incomplete control of obstructive sleep apnea on current pressure settings, recommend in lab CPAP titration study as he may need bilevel therapy- his mother is in agreement with this. Renew CPAP supplies with [facility]. We will place a new order for him to receive new PAP machine after titration study. Follow-up annually with Dr. Maple Hudson or sooner if needed.

## 2022-06-10 NOTE — Patient Instructions (Addendum)
Patient has very good compliance with wearing his CPAP at night  He is having incomplete control of obstructive sleep apnea on current pressure settings, recommend in lab CPAP titration study as he may need bilevel therapy  If he needs a new machine we will place an order after CPAP titration   Orders Renew CPAP supplies (DME [facility])  Follow-up 1 year with Dr. Maple Hudson or sooner if needed  CPAP and BIPAP Information CPAP and BIPAP are methods that use air pressure to keep your airways open and to help you breathe well. CPAP and BIPAP use different amounts of pressure. Your health care provider will tell you whether CPAP or BIPAP would be more helpful for you. CPAP stands for "continuous positive airway pressure." With CPAP, the amount of pressure stays the same while you breathe in (inhale) and out (exhale). BIPAP stands for "bi-level positive airway pressure." With BIPAP, the amount of pressure will be higher when you inhale and lower when you exhale. This allows you to take larger breaths. CPAP or BIPAP may be used in the hospital, or your health care provider may want you to use it at home. You may need to have a sleep study before your health care provider can order a machine for you to use at home. What are the advantages? CPAP or BIPAP can be helpful if you have: Sleep apnea. Chronic obstructive pulmonary disease (COPD). Heart failure. Medical conditions that cause muscle weakness, including muscular dystrophy or amyotrophic lateral sclerosis (ALS). Other problems that cause breathing to be shallow, weak, abnormal, or difficult. CPAP and BIPAP are most commonly used for obstructive sleep apnea (OSA) to keep the airways from collapsing when the muscles relax during sleep. What are the risks? Generally, this is a safe treatment. However, problems may occur, including: Irritated skin or skin sores if the mask does not fit properly. Dry or stuffy nose or nosebleeds. Dry mouth. Feeling  gassy or bloated. Sinus or lung infection if the equipment is not cleaned properly. When should CPAP or BIPAP be used? In most cases, the mask only needs to be worn during sleep. Generally, the mask needs to be worn throughout the night and during any daytime naps. People with certain medical conditions may also need to wear the mask at other times, such as when they are awake. Follow instructions from your health care provider about when to use the machine. What happens during CPAP or BIPAP?  Both CPAP and BIPAP are provided by a small machine with a flexible plastic tube that attaches to a plastic mask that you wear. Air is blown through the mask into your nose or mouth. The amount of pressure that is used to blow the air can be adjusted on the machine. Your health care provider will set the pressure setting and help you find the best mask for you. Tips for using the mask Because the mask needs to be snug, some people feel trapped or closed-in (claustrophobic) when first using the mask. If you feel this way, you may need to get used to the mask. One way to do this is to hold the mask loosely over your nose or mouth and then gradually apply the mask more snugly. You can also gradually increase the amount of time that you use the mask. Masks are available in various types and sizes. If your mask does not fit well, talk with your health care provider about getting a different one. Some common types of masks include: Full face masks,  which fit over the mouth and nose. Nasal masks, which fit over the nose. Nasal pillow or prong masks, which fit into the nostrils. If you are using a mask that fits over your nose and you tend to breathe through your mouth, a chin strap may be applied to help keep your mouth closed. Use a skin barrier to protect your skin as told by your health care provider. Some CPAP and BIPAP machines have alarms that may sound if the mask comes off or develops a leak. If you have  trouble with the mask, it is very important that you talk with your health care provider about finding a way to make the mask easier to tolerate. Do not stop using the mask. There could be a negative impact on your health if you stop using the mask. Tips for using the machine Place your CPAP or BIPAP machine on a secure table or stand near an electrical outlet. Know where the on/off switch is on the machine. Follow instructions from your health care provider about how to set the pressure on your machine and when you should use it. Do not eat or drink while the CPAP or BIPAP machine is on. Food or fluids could get pushed into your lungs by the pressure of the CPAP or BIPAP. For home use, CPAP and BIPAP machines can be rented or purchased through home health care companies. Many different brands of machines are available. Renting a machine before purchasing may help you find out which particular machine works well for you. Your health insurance company may also decide which machine you may get. Keep the CPAP or BIPAP machine and attachments clean. Ask your health care provider for specific instructions. Check the humidifier if you have a dry stuffy nose or nosebleeds. Make sure it is working correctly. Follow these instructions at home: Take over-the-counter and prescription medicines only as told by your health care provider. Ask if you can take sinus medicine if your sinuses are blocked. Do not use any products that contain nicotine or tobacco. These products include cigarettes, chewing tobacco, and vaping devices, such as e-cigarettes. If you need help quitting, ask your health care provider. Keep all follow-up visits. This is important. Contact a health care provider if: You have redness or pressure sores on your head, face, mouth, or nose from the mask or head gear. You have trouble using the CPAP or BIPAP machine. You cannot tolerate wearing the CPAP or BIPAP mask. Someone tells you that you  snore even when wearing your CPAP or BIPAP. Get help right away if: You have trouble breathing. You feel confused. Summary CPAP and BIPAP are methods that use air pressure to keep your airways open and to help you breathe well. If you have trouble with the mask, it is very important that you talk with your health care provider about finding a way to make the mask easier to tolerate. Do not stop using the mask. There could be a negative impact to your health if you stop using the mask. Follow instructions from your health care provider about when to use the machine. This information is not intended to replace advice given to you by your health care provider. Make sure you discuss any questions you have with your health care provider. Document Revised: 02/14/2021 Document Reviewed: 06/16/2020 Elsevier Patient Education  2023 [employer].

## 2022-06-11 DIAGNOSIS — G4733 Obstructive sleep apnea (adult) (pediatric): Secondary | ICD-10-CM | POA: Diagnosis not present

## 2022-06-27 ENCOUNTER — Encounter (HOSPITAL_BASED_OUTPATIENT_CLINIC_OR_DEPARTMENT_OTHER): Payer: Medicare Other | Admitting: Pulmonary Disease

## 2022-07-05 ENCOUNTER — Ambulatory Visit: Payer: Medicare Other | Admitting: Internal Medicine

## 2022-07-08 DIAGNOSIS — H6123 Impacted cerumen, bilateral: Secondary | ICD-10-CM | POA: Diagnosis not present

## 2022-07-08 DIAGNOSIS — G4733 Obstructive sleep apnea (adult) (pediatric): Secondary | ICD-10-CM | POA: Diagnosis not present

## 2022-07-22 DIAGNOSIS — G4733 Obstructive sleep apnea (adult) (pediatric): Secondary | ICD-10-CM | POA: Diagnosis not present

## 2022-08-23 ENCOUNTER — Ambulatory Visit (INDEPENDENT_AMBULATORY_CARE_PROVIDER_SITE_OTHER): Payer: 59 | Admitting: Internal Medicine

## 2022-08-23 ENCOUNTER — Encounter: Payer: Self-pay | Admitting: Internal Medicine

## 2022-08-23 VITALS — BP 126/74 | HR 88 | Temp 97.9°F | Ht 60.0 in | Wt 296.0 lb

## 2022-08-23 DIAGNOSIS — J301 Allergic rhinitis due to pollen: Secondary | ICD-10-CM | POA: Diagnosis not present

## 2022-08-23 DIAGNOSIS — Z0001 Encounter for general adult medical examination with abnormal findings: Secondary | ICD-10-CM

## 2022-08-23 DIAGNOSIS — E538 Deficiency of other specified B group vitamins: Secondary | ICD-10-CM | POA: Diagnosis not present

## 2022-08-23 DIAGNOSIS — J019 Acute sinusitis, unspecified: Secondary | ICD-10-CM | POA: Diagnosis not present

## 2022-08-23 DIAGNOSIS — E559 Vitamin D deficiency, unspecified: Secondary | ICD-10-CM | POA: Diagnosis not present

## 2022-08-23 DIAGNOSIS — R739 Hyperglycemia, unspecified: Secondary | ICD-10-CM

## 2022-08-23 DIAGNOSIS — R030 Elevated blood-pressure reading, without diagnosis of hypertension: Secondary | ICD-10-CM

## 2022-08-23 DIAGNOSIS — H04202 Unspecified epiphora, left lacrimal gland: Secondary | ICD-10-CM | POA: Diagnosis not present

## 2022-08-23 DIAGNOSIS — Z1159 Encounter for screening for other viral diseases: Secondary | ICD-10-CM

## 2022-08-23 DIAGNOSIS — E7800 Pure hypercholesterolemia, unspecified: Secondary | ICD-10-CM | POA: Diagnosis not present

## 2022-08-23 LAB — CBC WITH DIFFERENTIAL/PLATELET
Basophils Absolute: 0.1 10*3/uL (ref 0.0–0.1)
Basophils Relative: 0.9 % (ref 0.0–3.0)
Eosinophils Absolute: 0.2 10*3/uL (ref 0.0–0.7)
Eosinophils Relative: 2.6 % (ref 0.0–5.0)
HCT: 42.9 % (ref 39.0–52.0)
Hemoglobin: 14.2 g/dL (ref 13.0–17.0)
Lymphocytes Relative: 26.1 % (ref 12.0–46.0)
Lymphs Abs: 1.9 10*3/uL (ref 0.7–4.0)
MCHC: 33.1 g/dL (ref 30.0–36.0)
MCV: 83.7 fl (ref 78.0–100.0)
Monocytes Absolute: 0.6 10*3/uL (ref 0.1–1.0)
Monocytes Relative: 8.1 % (ref 3.0–12.0)
Neutro Abs: 4.6 10*3/uL (ref 1.4–7.7)
Neutrophils Relative %: 62.3 % (ref 43.0–77.0)
Platelets: 279 10*3/uL (ref 150.0–400.0)
RBC: 5.13 Mil/uL (ref 4.22–5.81)
RDW: 15 % (ref 11.5–15.5)
WBC: 7.3 10*3/uL (ref 4.0–10.5)

## 2022-08-23 LAB — LIPID PANEL
Cholesterol: 202 mg/dL — ABNORMAL HIGH (ref 0–200)
HDL: 38 mg/dL — ABNORMAL LOW (ref >39.00)
LDL Cholesterol: 146 mg/dL — ABNORMAL HIGH (ref 0–99)
NonHDL: 164.43
Total CHOL/HDL Ratio: 5
Triglycerides: 91 mg/dL (ref 0.0–149.0)
VLDL: 18.2 mg/dL (ref 0.0–40.0)

## 2022-08-23 LAB — URINALYSIS, ROUTINE W REFLEX MICROSCOPIC
Bilirubin Urine: NEGATIVE
Hgb urine dipstick: NEGATIVE
Ketones, ur: NEGATIVE
Leukocytes,Ua: NEGATIVE
Nitrite: NEGATIVE
Renal Epithel, UA: NONE SEEN
Specific Gravity, Urine: 1.02 (ref 1.000–1.030)
Urine Glucose: NEGATIVE
Urobilinogen, UA: 1 (ref 0.0–1.0)
pH: 7 (ref 5.0–8.0)

## 2022-08-23 LAB — HEPATIC FUNCTION PANEL
ALT: 18 U/L (ref 0–53)
AST: 15 U/L (ref 0–37)
Albumin: 4.3 g/dL (ref 3.5–5.2)
Alkaline Phosphatase: 78 U/L (ref 39–117)
Bilirubin, Direct: 0.2 mg/dL (ref 0.0–0.3)
Total Bilirubin: 0.6 mg/dL (ref 0.2–1.2)
Total Protein: 7.7 g/dL (ref 6.0–8.3)

## 2022-08-23 LAB — BASIC METABOLIC PANEL
BUN: 12 mg/dL (ref 6–23)
CO2: 30 mEq/L (ref 19–32)
Calcium: 9.3 mg/dL (ref 8.4–10.5)
Chloride: 103 mEq/L (ref 96–112)
Creatinine, Ser: 0.7 mg/dL (ref 0.40–1.50)
GFR: 123.27 mL/min (ref >60.00)
Glucose, Bld: 104 mg/dL — ABNORMAL HIGH (ref 70–99)
Potassium: 4.1 mEq/L (ref 3.5–5.1)
Sodium: 141 mEq/L (ref 135–145)

## 2022-08-23 LAB — HEMOGLOBIN A1C: Hgb A1c MFr Bld: 6.1 % (ref 4.6–6.5)

## 2022-08-23 LAB — VITAMIN D 25 HYDROXY (VIT D DEFICIENCY, FRACTURES): VITD: 7.85 ng/mL — ABNORMAL LOW (ref 30.00–100.00)

## 2022-08-23 LAB — TSH: TSH: 2.01 u[IU]/mL (ref 0.35–5.50)

## 2022-08-23 LAB — VITAMIN B12: Vitamin B-12: 601 pg/mL (ref 211–911)

## 2022-08-23 MED ORDER — DOXYCYCLINE HYCLATE 100 MG PO TABS: 100.0000 mg | ORAL_TABLET | Freq: Two times a day (BID) | ORAL | 0 refills | Status: DC

## 2022-08-23 MED ORDER — METHYLPREDNISOLONE ACETATE 80 MG/ML IJ SUSP
80.0000 mg | Freq: Once | INTRAMUSCULAR | Status: AC
  Administered 2022-08-23: 80 mg via INTRAMUSCULAR

## 2022-08-23 NOTE — Progress Notes (Unsigned)
Patient ID: Scott Curtis, male   DOB: 1991-07-22, 32 y.o.   MRN: [id number]

## 2022-08-23 NOTE — Progress Notes (Unsigned)
Patient ID: Scott Curtis, male   DOB: [birth year], 32 y.o.   MRN: [id number]         Chief Complaint:: wellness exam and Referral for eye doctor and sinus pressure and ears  ,allergies, sinusitis, hyperglycemia, low B12       HPI:  Scott Curtis is a 32 y.o. male here for wellness exam; decliens tdap, covid booster, and flu shot; ok for hep c screen, o/w up to date                        Also  Here with 2-3 days acute onset fever, facial pain, pressure, headache, general weakness and malaise, and greenish d/c, with mild ST and cough, but pt denies chest pain, wheezing, increased sob or doe, orthopnea, PND, increased LE swelling, palpitations, dizziness or syncope. Does have several wks ongoing nasal allergy symptoms with clearish congestion, itch and sneezing, without fever, pain, ST, cough, swelling or wheezing..   Pt denies polydipsia, polyuria, or new focal neuro s/s.   Not taking vit d.  Mother asks for referral [facility] optho for persistent left eye watering   Wt Readings from Last 3 Encounters:  08/23/22 296 lb (134.3 kg)  06/10/22 293 lb 9.6 oz (133.2 kg)  06/03/22 294 lb 8 oz (133.6 kg)   BP Readings from Last 3 Encounters:  08/23/22 126/74  06/10/22 126/80  06/03/22 116/68   Immunization History  Administered Date(s) Administered   PFIZER(Purple Top)SARS-COV-2 Vaccination 11/24/2019, 12/27/2019   Health Maintenance Due  Topic Date Due   Medicare Annual Wellness (AWV)  Never done   DTaP/Tdap/Td (1 - Tdap) Never done   COVID-19 Vaccine (3 - 2023-24 season) 03/22/2022      Past Medical History:  Diagnosis Date   Development delay 09/14/2011   Impaired glucose tolerance 09/24/2012   Mental retardation 09/14/2011   Obesity 09/14/2011   Seizure disorder ([place]) 09/14/2011   Past Surgical History:  Procedure Laterality Date   NO PAST SURGERIES      reports that he has never smoked. He has never been exposed to tobacco smoke. He has never used smokeless tobacco. He reports that he  does not drink alcohol and does not use drugs. family history includes Arthritis in an other family member; Cancer in an other family member; Hypertension in an other family member. Allergies  Allergen Reactions   Codeine Hives   Penicillin G Rash    Has patient had a PCN reaction causing immediate rash, facial/tongue/throat swelling, SOB or lightheadedness with hypotension: Unknown Has patient had a PCN reaction causing severe rash involving mucus membranes or skin necrosis: No Has patient had a PCN reaction that required hospitalization: No Has patient had a PCN reaction occurring within the last 10 years: No If all of the above answers are "NO", then may proceed with Cephalosporin use.    Current Outpatient Medications on File Prior to Visit  Medication Sig Dispense Refill   albuterol (VENTOLIN HFA) 108 (90 Base) MCG/ACT inhaler TAKE 2 PUFFS BY MOUTH EVERY 6 HOURS AS NEEDED FOR WHEEZE OR SHORTNESS OF BREATH 8.5 each 3   azelastine (OPTIVAR) 0.05 % ophthalmic solution Place 2 drops into both eyes 2 (two) times daily. 6 mL 12   Cholecalciferol 50 MCG (2000 UT) TABS 1 tab by mouth once daily 30 tablet 99   clotrimazole-betamethasone (LOTRISONE) cream Use as directed twice per day as needed to affected area 45 g 2   escitalopram (LEXAPRO) 20 MG  tablet Take 1 tablet (20 mg total) by mouth daily. 90 tablet 3   fluocinonide (LIDEX) 0.05 % external solution APPLY TO AFFECTED AREA TWICE A DAY 60 mL 1   fluocinonide ointment (LIDEX) 6.41 % APPLY 1 APPLICATION TOPICALLY 2 (TWO) TIMES DAILY. APPLY TO KNEE AS DIRECTED 60 g 0   triamcinolone (NASACORT) 55 MCG/ACT AERO nasal inhaler Place 2 sprays into the nose daily. 1 each 12   triamcinolone cream (KENALOG) 0.1 % APPLY TO AFFECTED AREA TWICE A DAY 30 g 0   vitamin B-12 (CYANOCOBALAMIN) 1000 MCG tablet Take 1 tablet (1,000 mcg total) by mouth daily. 90 tablet 3   No current facility-administered medications on file prior to visit.        ROS:  All  others reviewed and negative.  Objective        PE:  BP 126/74 (BP Location: Right Arm, Patient Position: Sitting, Cuff Size: Large)   Pulse 88   Temp 97.9 F (36.6 C) (Oral)   Ht 5' (1.524 m)   Wt 296 lb (134.3 kg)   SpO2 95%   BMI 57.81 kg/m                 Constitutional: Pt appears in NAD               HENT: Head: NCAT.                Right Ear: External ear normal.                 Left Ear: External ear normal.                Eyes: . Pupils are equal, round, and reactive to light. Conjunctivae and EOM are normal               Nose: without d/c or deformity               Neck: Neck supple. Gross normal ROM               Cardiovascular: Normal rate and regular rhythm.                 Pulmonary/Chest: Effort normal and breath sounds without rales or wheezing.                Abd:  Soft, NT, ND, + BS, no organomegaly               Neurological: Pt is alert. At baseline orientation, motor grossly intact               Skin: Skin is warm. No rashes, no other new lesions, LE edema - ***               Psychiatric: Pt behavior is normal without agitation   Micro: none  Cardiac tracings I have personally interpreted today:  none  Pertinent Radiological findings (summarize): none   Lab Results  Component Value Date   WBC 6.4 08/23/2021   HGB 13.7 08/23/2021   HCT 42.7 08/23/2021   PLT 275.0 08/23/2021   GLUCOSE 94 08/23/2021   CHOL 182 08/23/2021   TRIG 113.0 08/23/2021   HDL 39.70 08/23/2021   LDLCALC 120 (H) 08/23/2021   ALT 12 08/23/2021   AST 14 08/23/2021   NA 144 08/23/2021   K 4.0 08/23/2021   CL 105 08/23/2021   CREATININE 0.80 08/23/2021   BUN 9 08/23/2021   CO2 31 08/23/2021  TSH 1.49 08/23/2021   HGBA1C 6.0 08/23/2021   Assessment/Plan:  Scott Curtis is a 32 y.o. Black or African American [2] male with  has a past medical history of Development delay (09/14/2011), Impaired glucose tolerance (09/24/2012), Mental retardation (09/14/2011), Obesity (09/14/2011),  and Seizure disorder ([place]) (09/14/2011).  No problem-specific Assessment & Plan notes found for this encounter.  Followup: No follow-ups on file.  Cathlean Cower, MD 08/23/2022 10:35 AM [facility]

## 2022-08-23 NOTE — Patient Instructions (Signed)
You had the steroid shot today  Please take all new medication as prescribed - the antibiotic  Please continue all other medications as before, and refills have been done if requested.  Please have the pharmacy call with any other refills you may need.  Please continue your efforts at being more active, low cholesterol diet, and weight control.  You are otherwise up to date with prevention measures today.  Please keep your appointments with your specialists as you may have planned  You will be contacted regarding the referral for: [facility] doctor  Please go to the LAB at the blood drawing area for the tests to be done  You will be contacted by phone if any changes need to be made immediately.  Otherwise, you will receive a letter about your results with an explanation, but please check with MyChart first.  Please remember to sign up for MyChart if you have not done so, as this will be important to you in the future with finding out test results, communicating by private email, and scheduling acute appointments online when needed.  Please make an Appointment to return in 6 months, or sooner if needed

## 2022-08-24 ENCOUNTER — Encounter: Payer: Self-pay | Admitting: Internal Medicine

## 2022-08-24 LAB — HEPATITIS C ANTIBODY: Hepatitis C Ab: NONREACTIVE

## 2022-08-24 NOTE — Assessment & Plan Note (Signed)
Mild to mod, for depomedrol IM 80 mg,  to f/u any worsening symptoms or concerns 

## 2022-08-24 NOTE — Assessment & Plan Note (Signed)
Lab Results  Component Value Date   VITAMINB12 601 08/23/2022   Stable, cont oral replacement - b12 1000 mcg qd

## 2022-08-24 NOTE — Assessment & Plan Note (Signed)
Lab Results  Component Value Date   HGBA1C 6.1 08/23/2022   Stable, pt to continue current medical treatment  - diet, wt control

## 2022-08-24 NOTE — Assessment & Plan Note (Signed)
Mild to mod, for antibx course doxy 100 bid,  to f/u any worsening symptoms or concerns

## 2022-08-24 NOTE — Assessment & Plan Note (Signed)
Last vitamin D Lab Results  Component Value Date   VD25OH 7.85 (L) 08/23/2022   Low, to start oral replacement

## 2022-08-24 NOTE — Assessment & Plan Note (Signed)
Persistent may not be related to allergy - for [facility] optho referral per mother request

## 2022-08-24 NOTE — Assessment & Plan Note (Signed)
Age and sex appropriate education and counseling updated with regular exercise and diet Referrals for preventative services - none needed Immunizations addressed - declines tdap, covid booster and flu shot Smoking counseling  - none needed Evidence for depression or other mood disorder - stable behavior at home per mother Most recent labs reviewed. I have personally reviewed and have noted: 1) the patient's medical and social history 2) The patient's current medications and supplements 3) The patient's height, weight, and BMI have been recorded in the chart

## 2022-08-30 ENCOUNTER — Telehealth: Payer: Self-pay | Admitting: Internal Medicine

## 2022-08-30 NOTE — Telephone Encounter (Signed)
Patient's mother wants you to call her back about her son's referral that shows has pending review.  Patient's mother's name:  Ry Kham  Phone:  [phone]

## 2022-08-30 NOTE — Telephone Encounter (Signed)
MD put referral in for Ophthalmologist. Called mom back no answer [facility] will send referrals a msg checking status on ophthalmology.Marland KitchenJohny Chess

## 2022-09-06 ENCOUNTER — Telehealth: Payer: Self-pay | Admitting: Internal Medicine

## 2022-09-06 NOTE — Telephone Encounter (Signed)
Contacted Scott Curtis to schedule their annual wellness visit. Appointment made for 09/24/2022.  Thank you,  [place] ??[phone]

## 2022-09-06 NOTE — Telephone Encounter (Signed)
Called patient to schedule Medicare Annual Wellness Visit (AWV). Left message for patient to call back and schedule Medicare Annual Wellness Visit (AWV).  due 08/22/2012 awvi per [facility]  Please schedule an appointment at any time with Scott Curtis -Nurse schedule 2.  If any questions, please contact me at [phone].  Thank you ,  Thank you,  [place] ??[phone]

## 2022-09-09 DIAGNOSIS — G4733 Obstructive sleep apnea (adult) (pediatric): Secondary | ICD-10-CM | POA: Diagnosis not present

## 2022-09-11 DIAGNOSIS — G4733 Obstructive sleep apnea (adult) (pediatric): Secondary | ICD-10-CM | POA: Diagnosis not present

## 2022-09-24 ENCOUNTER — Ambulatory Visit: Payer: 59

## 2022-10-02 ENCOUNTER — Ambulatory Visit (INDEPENDENT_AMBULATORY_CARE_PROVIDER_SITE_OTHER): Payer: 59

## 2022-10-02 VITALS — Ht 60.0 in | Wt 296.0 lb

## 2022-10-02 DIAGNOSIS — Z0000 Encounter for general adult medical examination without abnormal findings: Secondary | ICD-10-CM | POA: Diagnosis not present

## 2022-10-02 NOTE — Patient Instructions (Signed)
Scott Curtis , Thank you for taking time to come for your Medicare Wellness Visit. I appreciate your ongoing commitment to your health goals. Please review the following plan we discussed and let me know if I can assist you in the future.   These are the goals we discussed:  Goals      Client understands the importance of follow-up with providers by attending scheduled visits        This is a list of the screening recommended for you and due dates:  Health Maintenance  Topic Date Due   DTaP/Tdap/Td vaccine (1 - Tdap) Never done   COVID-19 Vaccine (3 - 2023-24 season) 03/22/2022   Flu Shot  10/20/2022*   Medicare Annual Wellness Visit  10/02/2023   Hepatitis C Screening: USPSTF Recommendation to screen - Ages 18-79 yo.  Completed   HIV Screening  Completed   HPV Vaccine  Aged Out  *Topic was postponed. The date shown is not the original due date.    Advanced directives: No  Conditions/risks identified: Yes  Next appointment: Follow up in one year for your annual wellness visit.  Preventive Care 42-63 Years Old, Male Preventive care refers to lifestyle choices and visits with your health care provider that can promote health and wellness. Preventive care visits are also called wellness exams. What can I expect for my preventive care visit? Counseling During your preventive care visit, your health care provider may ask about your: Medical history, including: Past medical problems. Family medical history. Current health, including: Emotional well-being. Home life and relationship well-being. Sexual activity. Lifestyle, including: Alcohol, nicotine or tobacco, and drug use. Access to firearms. Diet, exercise, and sleep habits. Safety issues such as seatbelt and bike helmet use. Sunscreen use. Work and work Statistician. Physical exam Your health care provider may check your: Height and weight. These may be used to calculate your BMI (body mass index). BMI is a measurement  that tells if you are at a healthy weight. Waist circumference. This measures the distance around your waistline. This measurement also tells if you are at a healthy weight and may help predict your risk of certain diseases, such as type 2 diabetes and high blood pressure. Heart rate and blood pressure. Body temperature. Skin for abnormal spots. What immunizations do I need? Vaccines are usually given at various ages, according to a schedule. Your health care provider will recommend vaccines for you based on your age, medical history, and lifestyle or other factors, such as travel or where you work. What tests do I need? Screening Your health care provider may recommend screening tests for certain conditions. This may include: Lipid and cholesterol levels. Diabetes screening. This is done by checking your blood sugar (glucose) after you have not eaten for a while (fasting). Hepatitis B test. Hepatitis C test. HIV (human immunodeficiency virus) test. STI (sexually transmitted infection) testing, if you are at risk. Talk with your health care provider about your test results, treatment options, and if necessary, the need for more tests. Follow these instructions at home: Eating and drinking  Eat a healthy diet that includes fresh fruits and vegetables, whole grains, lean protein, and low-fat dairy products. Drink enough fluid to keep your urine pale yellow. Take vitamin and mineral supplements as recommended by your health care provider. Do not drink alcohol if your health care provider tells you not to drink. If you drink alcohol: Limit how much you have to 0-2 drinks a day. Know how much alcohol is in your  drink. In the [place] one drink equals one 12 oz bottle of beer (355 mL), one 5 oz glass of wine (148 mL), or one 1 oz glass of hard liquor (44 mL). Lifestyle Brush your teeth every morning and night with fluoride toothpaste. Floss one time each day. Exercise for at least 30 minutes 5  or more days each week. Do not use any products that contain nicotine or tobacco. These products include cigarettes, chewing tobacco, and vaping devices, such as e-cigarettes. If you need help quitting, ask your health care provider. Do not use drugs. If you are sexually active, practice safe sex. Use a condom or other form of protection to prevent STIs. Find healthy ways to manage stress, such as: Meditation, yoga, or listening to music. Journaling. Talking to a trusted person. Spending time with friends and family. Minimize exposure to UV radiation to reduce your risk of skin cancer. Safety Always wear your seat belt while driving or riding in a vehicle. Do not drive: If you have been drinking alcohol. Do not ride with someone who has been drinking. If you have been using any mind-altering substances or drugs. While texting. When you are tired or distracted. Wear a helmet and other protective equipment during sports activities. If you have firearms in your house, make sure you follow all gun safety procedures. Seek help if you have been physically or sexually abused. What's next? Go to your health care provider once a year for an annual wellness visit. Ask your health care provider how often you should have your eyes and teeth checked. Stay up to date on all vaccines. This information is not intended to replace advice given to you by your health care provider. Make sure you discuss any questions you have with your health care provider. Document Revised: 01/03/2021 Document Reviewed: 01/03/2021 Elsevier Patient Education  [place].

## 2022-10-02 NOTE — Progress Notes (Signed)
I connected with  Scott Curtis and mother on 10/02/22 by a audio enabled telemedicine application and verified that I am speaking with the correct person using two identifiers.  Patient Location: Home  Provider Location: Home Office  I discussed the limitations of evaluation and management by telemedicine. The patient expressed understanding and agreed to proceed.  Subjective:   Scott Curtis is a 32 y.o. male who presents for an Initial Medicare Annual Wellness Visit.  Review of Systems     Cardiac Risk Factors include: advanced age (>55mn, >>70women);male gender;obesity (BMI >30kg/m2);sedentary lifestyle     Objective:    Today's Vitals   10/02/22 1534  Weight: 296 lb (134.3 kg)  Height: 5' (1.524 m)  PainSc: 0-No pain   Body mass index is 57.81 kg/m.     10/02/2022    3:37 PM 08/11/2017    8:04 PM 07/25/2016   12:51 PM  Advanced Directives  Does Patient Have a Medical Advance Directive? No No No  Would patient like information on creating a medical advance directive? No - Patient declined No - Patient declined     Current Medications (verified) Outpatient Encounter Medications as of 10/02/2022  Medication Sig   albuterol (VENTOLIN HFA) 108 (90 Base) MCG/ACT inhaler TAKE 2 PUFFS BY MOUTH EVERY 6 HOURS AS NEEDED FOR WHEEZE OR SHORTNESS OF BREATH   azelastine (OPTIVAR) 0.05 % ophthalmic solution Place 2 drops into both eyes 2 (two) times daily.   Cholecalciferol 50 MCG (2000 UT) TABS 1 tab by mouth once daily   clotrimazole-betamethasone (LOTRISONE) cream Use as directed twice per day as needed to affected area   doxycycline (VIBRA-TABS) 100 MG tablet Take 1 tablet (100 mg total) by mouth 2 (two) times daily.   escitalopram (LEXAPRO) 20 MG tablet Take 1 tablet (20 mg total) by mouth daily.   fluocinonide (LIDEX) 0.05 % external solution APPLY TO AFFECTED AREA TWICE A DAY   fluocinonide ointment (LIDEX) 0AB-123456789% APPLY 1 APPLICATION TOPICALLY 2 (TWO) TIMES DAILY. APPLY TO  KNEE AS DIRECTED   triamcinolone (NASACORT) 55 MCG/ACT AERO nasal inhaler Place 2 sprays into the nose daily.   triamcinolone cream (KENALOG) 0.1 % APPLY TO AFFECTED AREA TWICE A DAY   vitamin B-12 (CYANOCOBALAMIN) 1000 MCG tablet Take 1 tablet (1,000 mcg total) by mouth daily.   No facility-administered encounter medications on file as of 10/02/2022.    Allergies (verified) Codeine and Penicillin g   History: Past Medical History:  Diagnosis Date   Development delay 09/14/2011   Impaired glucose tolerance 09/24/2012   Mental retardation 09/14/2011   Obesity 09/14/2011   [place]iz[place] disorder ([place] 09/14/2011   Past Surgical History:  Procedure Laterality Date   NO PAST SURGERIES     Family History  Problem Relation Age of Onset   Arthritis Other    Hypertension Other    Cancer Other        breast cancer   Neuropathy Neg Hx    Social History   Socioeconomic History   Marital status: Single    Spouse name: Not on file   Number of children: Not on file   Years of education: Not on file   Highest education level: Not on file  Occupational History   Occupation: N/A  Tobacco Use   Smoking status: Never    Passive exposure: Never   Smokeless tobacco: Never  Substance and Sexual Activity   Alcohol use: No   Drug use: No   Sexual activity: Not  on file  Other Topics Concern   Not on file  Social History Narrative   Lives at home w/ his mom   Caffeine: occasional tea or soda   Social Determinants of Health   Financial Resource Strain: Low Risk  (10/02/2022)   Overall Financial Resource Strain (CARDIA)    Difficulty of Paying Living Expenses: Not hard at all  Food Insecurity: No Food Insecurity (10/02/2022)   Hunger Vital Sign    Worried About Running Out of Food in the Last Year: Never true    Ran Out of Food in the Last Year: Never true  Transportation Needs: No Transportation Needs (10/02/2022)   PRAPARE - Hydrologist (Medical): No    Lack of  Transportation (Non-Medical): No  Physical Activity: Inactive (10/02/2022)   Exercise Vital Sign    Days of Exercise per Week: 0 days    Minutes of Exercise per Session: 0 min  Stress: No Stress Concern Present (10/02/2022)   [place]    Feeling of Stress : Not at all  Social Connections: Unknown (10/02/2022)   Social Connection and Isolation Panel [NHANES]    Frequency of Communication with Friends and Family: Patient refused    Frequency of Social Gatherings with Friends and Family: Patient refused    Attends Religious Services: Patient refused    Marine scientist or Organizations: Patient refused    Attends Music therapist: Patient refused    Marital Status: Patient refused    Tobacco Counseling Counseling given: Not Answered   Clinical Intake:  Pre-visit preparation completed: Yes  Pain : No/denies pain Pain Score: 0-No pain     Nutritional Risks: None Diabetes: No  How often do you need to have someone help you when you read instructions, pamphlets, or other written materials from your doctor or pharmacy?: 1 - Never What is the last grade level you completed in school?: HSG  Diabetic? No  Interpreter Needed?: No  Information entered by :: Lisette Abu, LPN.   Activities of Daily Living    10/02/2022    3:53 PM  In your present state of health, do you have any difficulty performing the following activities:  Hearing? 0  Vision? 0  Difficulty concentrating or making decisions? 1  Walking or climbing stairs? 0  Dressing or bathing? 1  Doing errands, shopping? 1  Preparing Food and eating ? N  Using the Toilet? Y  In the past six months, have you accidently leaked urine? Y  Do you have problems with loss of bowel control? Y  Managing your Medications? Y  Managing your Finances? Y  Housekeeping or managing your Housekeeping? Y    Patient Care Team: Biagio Borg, MD  as PCP - General (Internal Medicine)  Indicate any recent Medical Services you may have received from other than [facility] providers in the past year (date may be approximate).     Assessment:   This is a routine wellness examination for Scott Curtis.  Hearing/Vision screen Hearing Screening - Comments:: Patient denied any hearing difficulty.   Vision Screening - Comments:: Patient does wear corrective lenses/contacts.  Eye exam done by: [place]   Dietary issues and exercise activities discussed: Current Exercise Habits: The patient does not participate in regular exercise at present, Exercise limited by: neurologic condition(s)   Goals Addressed             This Visit's Progress  Client understands the importance of follow-up with providers by attending scheduled visits        Depression Screen    10/02/2022    3:49 PM 08/23/2022   10:35 AM 08/23/2022   10:04 AM 06/03/2022   10:11 AM 02/21/2022    3:06 PM 08/23/2021    9:09 AM 08/23/2021    8:53 AM  PHQ 2/9 Scores  PHQ - 2 Score 0 0 2 3 0 1 0  PHQ- 9 Score '8  12 10 2      '$ Fall Risk    10/02/2022    3:38 PM 08/23/2022   10:35 AM 06/03/2022   10:06 AM 02/21/2022    3:06 PM 08/23/2021    9:09 AM  [place] in the past year? 0 0 1 0 0  Number falls in past yr: 0 0 1 0 0  Injury with Fall? 0 0 1 0 0  Risk for fall due to : No Fall Risks  No Fall Risks    Follow up Falls prevention discussed  Falls evaluation completed      FALL RISK PREVENTION PERTAINING TO THE HOME:  Any stairs in or around the home? Yes  If so, are there any without handrails? No  Home free of loose throw rugs in walkways, pet beds, electrical cords, etc? Yes  Adequate lighting in your home to reduce risk of falls? Yes   ASSISTIVE DEVICES UTILIZED TO PREVENT FALLS:  Life alert? No  Use of a cane, walker or w/c? No  Grab bars in the bathroom? No  Shower chair or bench in shower? No  Elevated toilet seat or a handicapped toilet? No    TIMED UP AND GO:  Was the test performed? No . Telephonic Visit  Cognitive Function: Not able to perform 6 CIT Screening. Patient did not answer any questions.  Mother assisted with this visit.        Immunizations Immunization History  Administered Date(s) Administered   PFIZER(Purple Top)SARS-COV-2 Vaccination 11/24/2019, 12/27/2019    TDAP status: Never done  Flu vaccine status: Never done  Pneumococcal vaccine: Never done  Covid-19 vaccine status: Completed vaccines  Qualifies for Shingles Vaccine? No   Zostavax completed No   Shingrix Completed?: No.    Education has been provided regarding the importance of this vaccine. Patient has been advised to call insurance company to determine out of pocket expense if they have not yet received this vaccine. Advised may also receive vaccine at local pharmacy or Health Dept. Verbalized acceptance and understanding.  Screening Tests Health Maintenance  Topic Date Due   DTaP/Tdap/Td (1 - Tdap) Never done   COVID-19 Vaccine (3 - 2023-24 season) 03/22/2022   INFLUENZA VACCINE  10/20/2022 (Originally 02/19/2022)   Medicare Annual Wellness (AWV)  10/02/2023   Hepatitis C Screening  Completed   HIV Screening  Completed   HPV VACCINES  Aged Out    Health Maintenance  Health Maintenance Due  Topic Date Due   DTaP/Tdap/Td (1 - Tdap) Never done   COVID-19 Vaccine (3 - 2023-24 season) 03/22/2022    Colorectal cancer screening: Never done  Lung Cancer Screening: (Low Dose CT Chest recommended if Age 25-80 years, 30 pack-year currently smoking OR have quit w/in 15years.) does not qualify.   Lung Cancer Screening Referral: no  Additional Screening:  Hepatitis C Screening: does qualify; Completed 08/23/2022  Vision Screening: Recommended annual ophthalmology exams for early detection of glaucoma and other disorders of the eye. Is  the patient up to date with their annual eye exam?  Yes  Who is the provider or what is the name of  the office in which the patient attends annual eye exams? [facility] If pt is not established with a provider, would they like to be referred to a provider to establish care? No .   Dental Screening: Recommended annual dental exams for proper oral hygiene  Community Resource Referral / Chronic Care Management: CRR required this visit?  No   CCM required this visit?  No      Plan:     I have personally reviewed and noted the following in the patient's chart:   Medical and social history Use of alcohol, tobacco or illicit drugs  Current medications and supplements including opioid prescriptions. Patient is not currently taking opioid prescriptions. Functional ability and status Nutritional status Physical activity Advanced directives List of other physicians Hospitalizations, surgeries, and ER visits in previous 12 months Vitals Screenings to include cognitive, depression, and falls Referrals and appointments  In addition, I have reviewed and discussed with patient certain preventive protocols, quality metrics, and best practice recommendations. A written personalized care plan for preventive services as well as general preventive health recommendations were provided to patient.     Sheral Flow, LPN   [id number]   Nurse Notes:  Mother requested for Respite Care; referral was placed.

## 2022-10-03 ENCOUNTER — Telehealth: Payer: Self-pay | Admitting: *Deleted

## 2022-10-03 NOTE — Progress Notes (Signed)
  Care Coordination  Outreach Note  10/03/2022 Name: Scott Curtis MRN: [id number] DOB: [birth year]   Care Coordination Outreach Attempts: An unsuccessful telephone outreach was attempted today to offer the patient information about available care coordination services as a benefit of their health plan.   Follow Up Plan:  Additional outreach attempts will be made to offer the patient care coordination information and services.   Encounter Outcome:  No Answer  [place]  Direct Dial: [phone]

## 2022-10-03 NOTE — Progress Notes (Addendum)
  Care Coordination   Note   10/03/2022 Name: Scott Curtis MRN: [id number] DOB: 1990-12-25  Scott Curtis is a 32 y.o. year old male who sees Biagio Borg, MD for primary care. I reached out to Lazarus Salines by phone today to offer care coordination services.  [name] was given information about Care Coordination services today including:   The Care Coordination services include support from the care team which includes your Nurse Coordinator, Clinical Social Worker, or Pharmacist.  The Care Coordination team is here to help remove barriers to the health concerns and goals most important to you. Care Coordination services are voluntary, and the patient may decline or stop services at any time by request to their care team member.   Care Coordination Consent Status: Patient mother Rasmus Preusser agreed to services and verbal consent obtained.   Follow up plan:  Telephone appointment with care coordination team member scheduled for:  10/04/22  Encounter Outcome:  Pt. Scheduled  [place]  Direct Dial: [phone]

## 2022-10-04 ENCOUNTER — Ambulatory Visit: Payer: Self-pay | Admitting: Licensed Clinical Social Worker

## 2022-10-04 NOTE — Patient Outreach (Signed)
  Care Coordination  Initial Visit Note   10/04/2022 Name: SADIQ TENNYSON MRN: [id number] DOB: Nov 19, 1990  MICAN COBY is a 32 y.o. year old male who sees Biagio Borg, MD for primary care. I spoke with  Lazarus Salines 's mother by phone today.  What matters to the patients health and wellness today?  Per mom/ Legal Guardian they need support in the home to assist with patient's care.    Goals Addressed             This Visit's Progress    Provided Caregiver with support in the home for patient       Activities and task to complete in order to accomplish goals.   I have placed a referral with [facility] for the personal care services they will contact you.  Form has been faxed to Dr. Jenny Reichmann I have placed a referral to [place] [facility] [phone] to establish care.  Look for a packet in the mail from [place].  Per your request information brochures on each program have been e-mailed       SDOH assessments and interventions completed:  No   Care Coordination Interventions:  Yes, provided  Interventions Today    Flowsheet Row Most Recent Value  Chronic Disease   Chronic disease during today's visit Other  [Development Delay / Intellectural disability]  General Interventions   General Interventions Discussed/Reviewed General Interventions Discussed, [employer], Level of Care, Communication with  [reviewed community Day program options, not interested at this time]  Communication with PCP/Specialists  [CAPS coordinator]  Level of Care Personal Care Services  [completed PCS and faxed to PCP also requested  CAPS referral]  Applications Personal Care Services  [CAPS]  Education Interventions   Applications Personal Care Services  [CAPS]  Advanced Directive Interventions   Advanced Directives Discussed/Reviewed Guardianship  [Discussed]       Follow up plan: Follow up call scheduled for 2 weeks    Encounter Outcome:  Pt. Visit Completed   Casimer Lanius,  [place] [phone]

## 2022-10-04 NOTE — Patient Instructions (Signed)
Visit Information  Thank you for taking time to visit with me today. Please don't hesitate to contact me if I can be of assistance to you.   Following are the goals we discussed today:   Goals Addressed             This Visit's Progress    Provided Caregiver with support in the home for patient       Activities and task to complete in order to accomplish goals.   I have placed a referral with [facility] for the personal care services they will contact you.  Form has been faxed to Dr. Jenny Reichmann I have placed a referral to [place] [facility] [phone] to establish care.  Look for a packet in the mail from [place].  Per your request information brochures on each program have been e-mailed        Our next appointment is by telephone on 10/13/22 at 9:00  Please call the care guide team at [phone] if you need to cancel or reschedule your appointment.    Patient's mother verbalizes understanding of instructions and care plan provided today and agrees to view in [place]. Active MyChart status and patient understanding of how to access instructions and care plan via MyChart confirmed with patient.     Casimer Lanius, [place] [phone]

## 2022-10-11 ENCOUNTER — Encounter: Payer: Self-pay | Admitting: Licensed Clinical Social Worker

## 2022-10-11 NOTE — Patient Instructions (Signed)
  Patient was not contacted during this encounter.  LCSW collaborated with care team to accomplish patient's care plan goal   Casimer Lanius, [place] Work Care Coordination  [phone]

## 2022-10-11 NOTE — Patient Outreach (Signed)
  Care Coordination  Collaboration  Visit Note   10/11/2022 Name: CARTEL VOTAVA MRN: [id number] DOB: [birth year]  BRECKAN OCHSNER is a 32 y.o. year old male who sees Biagio Borg, MD for primary care.     Patient was not interviewed or contacted during this encounter LCSW  collaborated with PCP  to assist with meeting patient's needs.  .   What matters to the patients health and wellness today?  Getting PCS Services    SDOH assessments and interventions completed:  No  Care Coordination Interventions:  Yes, provided  Interventions Today    Flowsheet Row Most Recent Value  General Interventions   General Interventions Discussed/Reviewed Level of Care  Communication with PCP/Specialists  Level of [place]  [PCP did not recieve PCS form,  Refaxing it today]       Follow up plan:  will continue to collaborate with care team    Encounter Outcome:  Pt. Visit Completed   Casimer Lanius, [place] [phone]

## 2022-10-18 ENCOUNTER — Ambulatory Visit: Payer: Self-pay | Admitting: Licensed Clinical Social Worker

## 2022-10-18 NOTE — Patient Instructions (Signed)
Visit Information  Thank you for taking time to visit with me today. Please don't hesitate to contact me if I can be of assistance to you.   Following are the goals we discussed today:   Goals Addressed             This Visit's Progress    Provide Caregiver with support in the home for patient       Activities and task to complete in order to accomplish goals.   I have placed a referral with [facility] for the personal care services they will contact you.  Form has been faxed to Dr. Jenny Reichmann I have placed a referral to [place] [facility] [phone] to establish care.  Look for a packet in the mail from [place].  Per your request information brochures on each program have been e-mailed I will continue working with your provider to get the PCS form completed and faxed        Our next appointment is by telephone on 11/01/22 at 9:00  Please call the care guide team at [phone] if you need to cancel or reschedule your appointment.   The patient's mother verbalized understanding of instructions, educational materials, and care plan provided today and DECLINED offer to receive copy of patient instructions, educational materials, and care plan.   Casimer Lanius, [place] [phone]

## 2022-10-18 NOTE — Patient Outreach (Signed)
  Care Coordination  Follow Up Visit Note   10/18/2022 Name: DRAVEN DEGOOD MRN: [id number] DOB: [birth year]  JUSTIAN BRINKS is a 32 y.o. year old male who sees Biagio Borg, MD for primary care. I spoke with  Lazarus Salines 's mother by phone today.  What matters to the patients health and wellness today?  Getting additional support in the home.  Patient's mother continues to need additional support in the home with patient while managing her own health needs. LCSW faxed PCS form to PCP, waiting on update and status.  Attempted outreach to [place] for update.     Goals Addressed             This Visit's Progress    Provide Caregiver with support in the home for patient       Activities and task to complete in order to accomplish goals.   I have placed a referral with [facility] for the personal care services they will contact you.  Form has been faxed to Dr. Jenny Reichmann I have placed a referral to [place] [facility] [phone] to establish care.  Look for a packet in the mail from [place].  Per your request information brochures on each program have been e-mailed I will continue working with your provider to get the PCS form completed and faxed       SDOH assessments and interventions completed:  No  Care Coordination Interventions:  Yes, provided  Interventions Today    Flowsheet Row Most Recent Value  Chronic Disease   Chronic disease during today's visit Other  [IDD]  General Interventions   General Interventions Discussed/Reviewed General Interventions Reviewed, Level of Care, Communication with  Communication with PCP/Specialists  [ref. getting PCS form completed and faxed]  Level of [place]  Education Interventions   Applications [place] Discussed/Reviewed Other  [caregiver stress,  (active listening,  emotional support)]       Follow up plan:  Follow up call scheduled for 2 weeks with patient's mother; LCSW will continue to collaborate with care team on needed form for PCS and CAP program    Encounter Outcome:  Pt. Visit Completed  Casimer Lanius, [place] [phone]

## 2022-10-21 DIAGNOSIS — G4733 Obstructive sleep apnea (adult) (pediatric): Secondary | ICD-10-CM | POA: Diagnosis not present

## 2022-11-01 ENCOUNTER — Telehealth: Payer: Self-pay | Admitting: Licensed Clinical Social Worker

## 2022-11-01 ENCOUNTER — Encounter: Payer: Self-pay | Admitting: Licensed Clinical Social Worker

## 2022-11-01 NOTE — Patient Instructions (Signed)
  I am sorry you were unable to keep your phone appointment today.   Please call me to reschedule   I have updated the goals we previously discussed:   Goals Addressed             This Visit's Progress    Provide Caregiver with support in the home for patient       Activities and task to complete in order to accomplish goals.  Mother will assist with task below I have placed a referral with [facility] for the personal care services they will contact you.  However to move things alone faster Please call them [phone] to schedule the assessment for Christiane Ha. You will be on hold for a while.  I have placed a referral to [facility] [phone] to establish care.  Look for a packet in the mail from [place]. I spoke to the coordinator they will mail you another packet, they do not show that one was received.  Please call me when you receive the new packet so that we can review it together.  Per your request information brochures on each program have been e-mailed to you        Please call the care guide team at [phone] if you need to cancel or reschedule your appointment.   If you are experiencing a Mental Health or Behavioral Health Crisis or need someone to talk to, please call 1-800-273-TALK (toll free, 24 hour hotline)    Sammuel Hines, LCSW Social Work Care Coordination  [phone]

## 2022-11-01 NOTE — Patient Outreach (Signed)
  Care Coordination   11/01/2022 Name: Scott Curtis MRN: [id number] DOB: 12/05/1990   Care Coordination Outreach Attempts:  An unsuccessful telephone outreach was attempted for a scheduled appointment today.  Follow Up Plan:  Additional outreach attempts will be made to offer the patient care coordination information and services.   Encounter Outcome:  No Answer   Care Coordination Interventions:  Yes, provided   Interventions Today    Flowsheet Row Most Recent Value  General Interventions   General Interventions Discussed/Reviewed Level of Care, Communication with  Communication with PCP/Specialists  [CAPS coordinator,  call made to [facility] ,  office CMA]  Level of Care Personal Care Services       Sammuel Hines, LCSW Social Work Care Coordination  [facility] Emmie Niemann [employer] [phone]

## 2022-11-04 DIAGNOSIS — H52223 Regular astigmatism, bilateral: Secondary | ICD-10-CM | POA: Diagnosis not present

## 2022-11-04 DIAGNOSIS — H50111 Monocular exotropia, right eye: Secondary | ICD-10-CM | POA: Diagnosis not present

## 2022-11-04 DIAGNOSIS — H5213 Myopia, bilateral: Secondary | ICD-10-CM | POA: Diagnosis not present

## 2022-11-04 DIAGNOSIS — R6250 Unspecified lack of expected normal physiological development in childhood: Secondary | ICD-10-CM | POA: Diagnosis not present

## 2022-11-07 ENCOUNTER — Telehealth: Payer: Self-pay | Admitting: *Deleted

## 2022-11-07 NOTE — Progress Notes (Signed)
  Care Coordination Note  11/07/2022 Name: Scott Curtis MRN: [id number] DOB: April 12, 1991  Scott Curtis is a 32 y.o. year old male who is a primary care patient of Corwin Levins, MD and is actively engaged with the care management team. I reached out to Sheran Fava by phone today to assist with re-scheduling a follow up visit with the Licensed Clinical Social Worker  Follow up plan: Unsuccessful telephone outreach attempt made. A HIPAA compliant phone message was left for the patient providing contact information and requesting a return call.   Burman Nieves, CCMA Care Coordination Care Guide Direct Dial: [phone]

## 2022-11-11 NOTE — Progress Notes (Signed)
  Care Coordination Note  11/11/2022 Name: Scott Curtis MRN: [id number] DOB: [birth year]  Scott Curtis is a 32 y.o. year old male who is a primary care patient of Corwin Levins, MD and is actively engaged with the care management team. I reached out to Sheran Fava by phone today to assist with re-scheduling a follow up visit with the RN Case Manager  Follow up plan: 2nd Unsuccessful telephone outreach attempt made. A HIPAA compliant phone message was left for the patient providing contact information and requesting a return call.   Burman Nieves, CCMA Care Coordination Care Guide Direct Dial: [phone]

## 2022-11-22 NOTE — Progress Notes (Signed)
  Care Coordination Note  11/22/2022 Name: [name] MRN: [id number] DOB: [birth year]  Scott Curtis is a 32 y.o. year old male who is a primary care patient of Corwin Levins, MD and is actively engaged with the care management team. I reached out to Sheran Fava by phone today to assist with re-scheduling a follow up visit with the Licensed Clinical Social Worker  Follow up plan: We have been unable to make contact with the patient for follow up.    Burman Nieves, CCMA Care Coordination Care Guide Direct Dial: [phone]

## 2022-11-29 ENCOUNTER — Ambulatory Visit: Payer: 59 | Admitting: Family Medicine

## 2022-12-08 DIAGNOSIS — G4733 Obstructive sleep apnea (adult) (pediatric): Secondary | ICD-10-CM | POA: Diagnosis not present

## 2022-12-09 ENCOUNTER — Ambulatory Visit
Admission: EM | Admit: 2022-12-09 | Discharge: 2022-12-09 | Disposition: A | Discharge status: Home / Self Care | Payer: 59 | Attending: Family Medicine | Admitting: Family Medicine

## 2022-12-09 DIAGNOSIS — H66001 Acute suppurative otitis media without spontaneous rupture of ear drum, right ear: Secondary | ICD-10-CM | POA: Diagnosis not present

## 2022-12-09 DIAGNOSIS — G4733 Obstructive sleep apnea (adult) (pediatric): Secondary | ICD-10-CM | POA: Diagnosis not present

## 2022-12-09 MED ORDER — AZITHROMYCIN 250 MG PO TABS: ORAL_TABLET | ORAL | 0 refills | Status: DC

## 2022-12-09 NOTE — Discharge Instructions (Signed)
You were seen today for ear infection and sinus infection.  I have sent out an antibiotic to take.  Please continue over the counter medication for his symptoms as well.  Return if not improving.

## 2022-12-09 NOTE — ED Provider Notes (Signed)
[facility]    CSN: [id number] Arrival date & time: 12/09/22  6967      History   Chief Complaint Chief Complaint  Patient presents with   Cough    HPI DELAN CANJURA is a 32 y.o. male.   Patient is here with care giver today.  Patient is here for cough, congestion and right ear pain.  Started f4 days ago.  No drainage is noted.  He seems uncomfortable.  No fevers or chills.  Given cough syrup, claritin, and benadryl.        Past Medical History:  Diagnosis Date   Development delay 09/14/2011   Impaired glucose tolerance 09/24/2012   Mental retardation 09/14/2011   Obesity 09/14/2011   Seizure disorder (HCC) 09/14/2011    Patient Active Problem List   Diagnosis Date Noted   Watering of left eye 08/23/2022   Gynecomastia, male 06/03/2022   Impacted ear wax 08/23/2021   Hyperglycemia 08/23/2021   B12 deficiency 08/23/2021   Vitamin D deficiency 08/23/2021   Boil, leg 02/24/2021   Nocturnal hypoxemia 07/11/2019   Cough 03/31/2019   Right conjunctivitis 03/02/2019   Acute sinus infection 12/06/2018   Obstructive sleep apnea 08/19/2018   Left ear pain 04/16/2018   Left inguinal hernia 10/16/2017   Irritability and anger 08/13/2017   External otitis of right ear 08/13/2017   Lichen simplex chronicus 11/21/2016   Seborrheic dermatitis 11/21/2016   Elevated blood-pressure reading without diagnosis of hypertension 09/15/2016   Scalp lesion 09/10/2016   Acute upper respiratory infection 09/10/2016   Bilateral impacted cerumen 08/21/2016   Chronic mycotic otitis externa 08/21/2016   Rash and nonspecific skin eruption 04/06/2015   Allergic rhinitis 10/26/2014   Acne 09/24/2012   Allergic conjunctivitis 09/17/2011   Seizure disorder (HCC) 09/14/2011   Encounter for well adult exam with abnormal findings 09/14/2011   Development delay 09/14/2011   Intellectual disability 09/14/2011   Obesity 09/14/2011    Past Surgical History:  Procedure  Laterality Date   NO PAST SURGERIES         Home Medications    Prior to Admission medications   Medication Sig Start Date End Date Taking? Authorizing Provider  lamoTRIgine (LAMICTAL) 100 MG tablet Take 100 mg by mouth daily.   Yes [provider]  albuterol (VENTOLIN HFA) 108 (90 Base) MCG/ACT inhaler TAKE 2 PUFFS BY MOUTH EVERY 6 HOURS AS NEEDED FOR WHEEZE OR SHORTNESS OF BREATH 11/07/21   Corwin Levins, MD  azelastine (OPTIVAR) 0.05 % ophthalmic solution Place 2 drops into both eyes 2 (two) times daily. 06/03/22   Corwin Levins, MD  Cholecalciferol 50 MCG (2000 UT) TABS 1 tab by mouth once daily 08/23/21   Corwin Levins, MD  clotrimazole-betamethasone (LOTRISONE) cream Use as directed twice per day as needed to affected area 12/27/20   Corwin Levins, MD  doxycycline (VIBRA-TABS) 100 MG tablet Take 1 tablet (100 mg total) by mouth 2 (two) times daily. 08/23/22   Corwin Levins, MD  escitalopram (LEXAPRO) 20 MG tablet Take 1 tablet (20 mg total) by mouth daily. 11/07/21   Corwin Levins, MD  fluocinonide (LIDEX) 0.05 % external solution APPLY TO AFFECTED AREA TWICE A DAY 02/05/21   Corwin Levins, MD  fluocinonide ointment (LIDEX) 0.05 % APPLY 1 APPLICATION TOPICALLY 2 (TWO) TIMES DAILY. APPLY TO KNEE AS DIRECTED 05/02/20   Olive Bass, FNP  triamcinolone (NASACORT) 55 MCG/ACT AERO nasal inhaler Place 2 sprays into the nose  daily. 12/27/20   Corwin Levins, MD  triamcinolone cream (KENALOG) 0.1 % APPLY TO AFFECTED AREA TWICE A DAY 02/05/21   Corwin Levins, MD  vitamin B-12 (CYANOCOBALAMIN) 1000 MCG tablet Take 1 tablet (1,000 mcg total) by mouth daily. 08/23/21   Corwin Levins, MD    Family History Family History  Problem Relation Age of Onset   Arthritis Other    Hypertension Other    Cancer Other        breast cancer   Neuropathy Neg Hx     Social History Social History   Tobacco Use   Smoking status: Never    Passive exposure: Never   Smokeless tobacco: Never   Substance Use Topics   Alcohol use: No   Drug use: No     Allergies   Codeine and Penicillin g   Review of Systems Review of Systems  Constitutional: Negative.   HENT:  Positive for congestion, ear pain and rhinorrhea.   Respiratory:  Positive for cough.   Gastrointestinal: Negative.   Musculoskeletal: Negative.   Psychiatric/Behavioral: Negative.       Physical Exam Triage Vital Signs ED Triage Vitals  Enc Vitals Group     BP 12/09/22 1005 130/82     Pulse Rate 12/09/22 1005 88     Resp 12/09/22 1005 16     Temp 12/09/22 1005 98.2 F (36.8 C)     Temp Source 12/09/22 1005 Oral     SpO2 12/09/22 1005 97 %     Weight --      Height --      Head Circumference --      Peak Flow --      Pain Score 12/09/22 1006 0     Pain Loc --      Pain Edu? --      Excl. in GC? --    No data found.  Updated Vital Signs BP 130/82 (BP Location: Left Arm)   Pulse 88   Temp 98.2 F (36.8 C) (Oral)   Resp 16   SpO2 97%   Visual Acuity Right Eye Distance:   Left Eye Distance:   Bilateral Distance:    Right Eye Near:   Left Eye Near:    Bilateral Near:     Physical Exam Constitutional:      Appearance: Normal appearance.  HENT:     Right Ear: A middle ear effusion is present. Tympanic membrane is erythematous.     Nose: Congestion and rhinorrhea present.     Right Sinus: Maxillary sinus tenderness present.     Left Sinus: Maxillary sinus tenderness present.  Cardiovascular:     Rate and Rhythm: Normal rate.  Pulmonary:     Effort: Pulmonary effort is normal.     Breath sounds: Normal breath sounds.  Musculoskeletal:     Cervical back: Normal range of motion and neck supple. No tenderness.  Lymphadenopathy:     Cervical: No cervical adenopathy.  Neurological:     Mental Status: He is alert.  Psychiatric:        Mood and Affect: Mood normal.      UC Treatments / Results  Labs (all labs ordered are listed, but only abnormal results are displayed) Labs  Reviewed - No data to display  EKG   Radiology No results found.  Procedures Procedures (including critical care time)  Medications Ordered in UC Medications - No data to display  Initial Impression / Assessment and Plan / UC  Course  I have reviewed the triage vital signs and the nursing notes.  Pertinent labs & imaging results that were available during my care of the patient were reviewed by me and considered in my medical decision making (see chart for details).   Final Clinical Impressions(s) / UC Diagnoses   Final diagnoses:  Non-recurrent acute suppurative otitis media of right ear without spontaneous rupture of tympanic membrane     Discharge Instructions      You were seen today for ear infection and sinus infection.  I have sent out an antibiotic to take.  Please continue over the counter medication for his symptoms as well.  Return if not improving.      ED Prescriptions     Medication Sig Dispense Auth. Provider   azithromycin (ZITHROMAX Z-PAK) 250 MG tablet Take as directed 6 each Jannifer Franklin, MD      PDMP not reviewed this encounter.   Jannifer Franklin, MD 12/09/22 1049

## 2022-12-09 NOTE — ED Triage Notes (Signed)
Pt c/o cough, nasal congestion, right otalgia   Onset ~ "last Friday"   Caregiver reports giving codeine from a previous encounter to help with sxs. Is concerned for "sinus infection."

## 2023-01-20 ENCOUNTER — Other Ambulatory Visit: Payer: Self-pay | Admitting: Internal Medicine

## 2023-01-20 ENCOUNTER — Other Ambulatory Visit: Payer: Self-pay

## 2023-01-20 DIAGNOSIS — G4733 Obstructive sleep apnea (adult) (pediatric): Secondary | ICD-10-CM | POA: Diagnosis not present

## 2023-01-30 ENCOUNTER — Other Ambulatory Visit: Payer: Self-pay | Admitting: Internal Medicine

## 2023-01-31 ENCOUNTER — Telehealth: Payer: Self-pay | Admitting: Internal Medicine

## 2023-01-31 MED ORDER — ESCITALOPRAM OXALATE 20 MG PO TABS: 20.0000 mg | ORAL_TABLET | Freq: Every day | ORAL | 3 refills | Status: DC

## 2023-01-31 NOTE — Telephone Encounter (Signed)
[facility]

## 2023-01-31 NOTE — Telephone Encounter (Signed)
Med is not on med list. Per chart was taking 20 mg as of 12/09/22.Marland KitchenRaechel Curtis

## 2023-01-31 NOTE — Telephone Encounter (Signed)
Prescription Request  01/31/2023  LOV: 08/23/2022  What is the name of the medication or equipment? Escitalopram (Lexapro) 20 MG tablet  Have you contacted your pharmacy to request a refill? No   Which pharmacy would you like this sent to?  [id number] [place], [place] - [address]  [place] [address] Phone: [phone] Fax: [phone]    Patient notified that their request is being sent to the clinical staff for review and that they should receive a response within 2 business days.   Please advise at Mobile There is no such number on file (mobile).

## 2023-01-31 NOTE — Telephone Encounter (Signed)
Done erx 

## 2023-02-21 ENCOUNTER — Ambulatory Visit: Payer: 59 | Admitting: Internal Medicine

## 2023-03-10 DIAGNOSIS — G4733 Obstructive sleep apnea (adult) (pediatric): Secondary | ICD-10-CM | POA: Diagnosis not present

## 2023-03-11 DIAGNOSIS — G4733 Obstructive sleep apnea (adult) (pediatric): Secondary | ICD-10-CM | POA: Diagnosis not present

## 2023-04-18 DIAGNOSIS — H6123 Impacted cerumen, bilateral: Secondary | ICD-10-CM | POA: Diagnosis not present

## 2023-04-22 DIAGNOSIS — G4733 Obstructive sleep apnea (adult) (pediatric): Secondary | ICD-10-CM | POA: Diagnosis not present

## 2023-05-29 ENCOUNTER — Ambulatory Visit (INDEPENDENT_AMBULATORY_CARE_PROVIDER_SITE_OTHER): Payer: 59 | Admitting: Internal Medicine

## 2023-05-29 ENCOUNTER — Encounter: Payer: Self-pay | Admitting: Internal Medicine

## 2023-05-29 VITALS — BP 124/74 | HR 93 | Temp 98.4°F | Ht 60.0 in | Wt 298.0 lb

## 2023-05-29 DIAGNOSIS — J301 Allergic rhinitis due to pollen: Secondary | ICD-10-CM

## 2023-05-29 DIAGNOSIS — J019 Acute sinusitis, unspecified: Secondary | ICD-10-CM | POA: Diagnosis not present

## 2023-05-29 DIAGNOSIS — R21 Rash and other nonspecific skin eruption: Secondary | ICD-10-CM | POA: Diagnosis not present

## 2023-05-29 DIAGNOSIS — T7840XA Allergy, unspecified, initial encounter: Secondary | ICD-10-CM

## 2023-05-29 DIAGNOSIS — E559 Vitamin D deficiency, unspecified: Secondary | ICD-10-CM

## 2023-05-29 DIAGNOSIS — R739 Hyperglycemia, unspecified: Secondary | ICD-10-CM

## 2023-05-29 MED ORDER — PREDNISONE 10 MG PO TABS: ORAL_TABLET | ORAL | 0 refills | Status: DC

## 2023-05-29 MED ORDER — AZITHROMYCIN 250 MG PO TABS: ORAL_TABLET | ORAL | 1 refills | Status: AC

## 2023-05-29 MED ORDER — TRIAMCINOLONE ACETONIDE 0.1 % EX CREA: 1.0000 | TOPICAL_CREAM | Freq: Two times a day (BID) | CUTANEOUS | 0 refills | Status: DC

## 2023-05-29 MED ORDER — METHYLPREDNISOLONE ACETATE 80 MG/ML IJ SUSP
80.0000 mg | Freq: Once | INTRAMUSCULAR | Status: AC
Start: 2023-05-29 — End: 2023-05-29
  Administered 2023-05-29: 80 mg via INTRAMUSCULAR

## 2023-05-29 NOTE — Patient Instructions (Addendum)
You had the steroid shot today  Please take all new medication as prescribed - the antibiotic, prednisone, and steroid cream for the ear  Please continue all other medications as before, and refills have been done if requested.  Please have the pharmacy call with any other refills you may need.  Please keep your appointments with your specialists as you may have planned  Please make an Appointment to return in 3 months, or sooner if needed

## 2023-05-29 NOTE — Progress Notes (Signed)
Patient ID: Scott Curtis, male   DOB: February 16, 1991, 32 y.o.   MRN: [id number]        Chief Complaint: follow up sinusitis, allergies, right tragus rash, hypergylcemia, low vit d       HPI:  Scott Curtis is a 32 y.o. male  Here with 2-3 days acute onset fever, facial pain, pressure, headache, general weakness and malaise, and greenish d/c, with mild ST and cough, but pt denies chest pain, wheezing, increased sob or doe, orthopnea, PND, increased LE swelling, palpitations, dizziness or syncope.  Does have several wks ongoing nasal allergy symptoms with clearish congestion, itch and sneezing, without fever, pain, ST, cough, swelling or wheezing.  Also with dry scaly itchy rash to right tragus area, constantly rubbing.         Wt Readings from Last 3 Encounters:  05/29/23 298 lb (135.2 kg)  10/02/22 296 lb (134.3 kg)  08/23/22 296 lb (134.3 kg)   BP Readings from Last 3 Encounters:  05/29/23 124/74  12/09/22 130/82  08/23/22 126/74         Past Medical History:  Diagnosis Date   Development delay 09/14/2011   Impaired glucose tolerance 09/24/2012   Mental retardation 09/14/2011   Obesity 09/14/2011   Seizure disorder (HCC) 09/14/2011   Past Surgical History:  Procedure Laterality Date   NO PAST SURGERIES      reports that he has never smoked. He has never been exposed to tobacco smoke. He has never used smokeless tobacco. He reports that he does not drink alcohol and does not use drugs. family history includes Arthritis in an other family member; Cancer in an other family member; Hypertension in an other family member. Allergies  Allergen Reactions   Codeine Hives   Penicillin G Rash    Has patient had a PCN reaction causing immediate rash, facial/tongue/throat swelling, SOB or lightheadedness with hypotension: Unknown Has patient had a PCN reaction causing severe rash involving mucus membranes or skin necrosis: No Has patient had a PCN reaction that required hospitalization: No Has  patient had a PCN reaction occurring within the last 10 years: No If all of the above answers are "NO", then may proceed with Cephalosporin use.    Current Outpatient Medications on File Prior to Visit  Medication Sig Dispense Refill   lamoTRIgine (LAMICTAL) 100 MG tablet Take 100 mg by mouth daily.     escitalopram (LEXAPRO) 20 MG tablet Take 1 tablet (20 mg total) by mouth daily. (Patient not taking: Reported on 05/29/2023) 90 tablet 3   No current facility-administered medications on file prior to visit.        ROS:  All others reviewed and negative.  Objective        PE:  BP 124/74 (BP Location: Left Arm, Patient Position: Sitting, Cuff Size: Normal)   Pulse 93   Temp 98.4 F (36.9 C) (Oral)   Ht 5' (1.524 m)   Wt 298 lb (135.2 kg)   SpO2 98%   BMI 58.20 kg/m                 Constitutional: Pt appears in NAD               HENT: Head: NCAT.                Right Ear: External ear normal.                 Left Ear: External ear normal. Bilat tm's with  mild erythema.  Max sinus areas mild tender.  Pharynx with mild erythema, no exudate                 Eyes: . Pupils are equal, round, and reactive to light. Conjunctivae and EOM are normal               Nose: without d/c or deformity               Neck: Neck supple. Gross normal ROM               Cardiovascular: Normal rate and regular rhythm.                 Pulmonary/Chest: Effort normal and breath sounds without rales or wheezing.                               Neurological: Pt is alert. At baseline orientation, motor grossly intact               Skin: Skin is warm. Has scary large area rash to right pinna primairy right , no other new lesions, LE edema - none               Psychiatric: Pt behavior is normal without agitation   Micro: none  Cardiac tracings I have personally interpreted today:  none  Pertinent Radiological findings (summarize): none   Lab Results  Component Value Date   WBC 7.3 08/23/2022   HGB 14.2  08/23/2022   HCT 42.9 08/23/2022   PLT 279.0 08/23/2022   GLUCOSE 104 (H) 08/23/2022   CHOL 202 (H) 08/23/2022   TRIG 91.0 08/23/2022   HDL 38.00 (L) 08/23/2022   LDLCALC 146 (H) 08/23/2022   ALT 18 08/23/2022   AST 15 08/23/2022   NA 141 08/23/2022   K 4.1 08/23/2022   CL 103 08/23/2022   CREATININE 0.70 08/23/2022   BUN 12 08/23/2022   CO2 30 08/23/2022   TSH 2.01 08/23/2022   HGBA1C 6.1 08/23/2022   Assessment/Plan:  Scott Curtis is a 32 y.o. Black or African American [2] male with  has a past medical history of Development delay (09/14/2011), Impaired glucose tolerance (09/24/2012), Mental retardation (09/14/2011), Obesity (09/14/2011), and Seizure disorder (HCC) (09/14/2011).  Acute sinus infection Mild to mod, for antibx course zpack,  to f/u any worsening symptoms or concerns  Allergic rhinitis Mild to mod, for depomedrol 80 mg IM, prednisone taper,  to f/u any worsening symptoms or concerns  Rash With frequent itching scaly dry rash tragus area   - for triam cream asd prn  Hyperglycemia Lab Results  Component Value Date   HGBA1C 6.1 08/23/2022   Stable, pt to continue current medical treatment  - diet, wt control   Vitamin D deficiency Last vitamin D Lab Results  Component Value Date   VD25OH 7.85 (L) 08/23/2022   Low, to start oral replacement  Followup: Return in about 3 months (around 08/29/2023).  [name], MD 05/31/2023 8:55 AM [facility]

## 2023-05-31 ENCOUNTER — Encounter: Payer: Self-pay | Admitting: Internal Medicine

## 2023-05-31 DIAGNOSIS — R21 Rash and other nonspecific skin eruption: Secondary | ICD-10-CM | POA: Insufficient documentation

## 2023-05-31 NOTE — Assessment & Plan Note (Signed)
Mild to mod, for antibx course zpack,  to f/u any worsening symptoms or concerns 

## 2023-05-31 NOTE — Assessment & Plan Note (Signed)
With frequent itching scaly dry rash tragus area   - for triam cream asd prn

## 2023-05-31 NOTE — Assessment & Plan Note (Signed)
Mild to mod, for depomedrol 80 mg IM, prednisone taper,  to f/u any worsening symptoms or concerns

## 2023-05-31 NOTE — Assessment & Plan Note (Signed)
Last vitamin D Lab Results  Component Value Date   VD25OH 7.85 (L) 08/23/2022   Low, to start oral replacement

## 2023-05-31 NOTE — Assessment & Plan Note (Signed)
Lab Results  Component Value Date   HGBA1C 6.1 08/23/2022   Stable, pt to continue current medical treatment  - diet, wt control

## 2023-06-10 DIAGNOSIS — G4733 Obstructive sleep apnea (adult) (pediatric): Secondary | ICD-10-CM | POA: Diagnosis not present

## 2023-07-21 ENCOUNTER — Ambulatory Visit (INDEPENDENT_AMBULATORY_CARE_PROVIDER_SITE_OTHER): Payer: 59 | Admitting: Internal Medicine

## 2023-07-21 ENCOUNTER — Encounter: Payer: Self-pay | Admitting: Internal Medicine

## 2023-07-21 VITALS — BP 120/74 | HR 92 | Temp 97.6°F | Ht 60.0 in | Wt 296.0 lb

## 2023-07-21 DIAGNOSIS — J069 Acute upper respiratory infection, unspecified: Secondary | ICD-10-CM | POA: Diagnosis not present

## 2023-07-21 DIAGNOSIS — R739 Hyperglycemia, unspecified: Secondary | ICD-10-CM | POA: Diagnosis not present

## 2023-07-21 DIAGNOSIS — J301 Allergic rhinitis due to pollen: Secondary | ICD-10-CM | POA: Diagnosis not present

## 2023-07-21 DIAGNOSIS — E538 Deficiency of other specified B group vitamins: Secondary | ICD-10-CM

## 2023-07-21 DIAGNOSIS — E559 Vitamin D deficiency, unspecified: Secondary | ICD-10-CM | POA: Diagnosis not present

## 2023-07-21 MED ORDER — METHYLPREDNISOLONE ACETATE 80 MG/ML IJ SUSP
80.0000 mg | Freq: Once | INTRAMUSCULAR | Status: AC
Start: 2023-07-21 — End: 2023-07-21
  Administered 2023-07-21: 80 mg via INTRAMUSCULAR

## 2023-07-21 MED ORDER — HYDROCODONE BIT-HOMATROP MBR 5-1.5 MG/5ML PO SOLN: 5.0000 mL | Freq: Four times a day (QID) | ORAL | 0 refills | Status: AC | PRN

## 2023-07-21 MED ORDER — LEVOFLOXACIN 500 MG PO TABS: 500.0000 mg | ORAL_TABLET | Freq: Every day | ORAL | 0 refills | Status: DC

## 2023-07-21 NOTE — Progress Notes (Signed)
Patient ID: Scott Curtis, male   DOB: 12/21/1990, 32 y.o.   MRN: [id number]        Chief Complaint: follow up sinusitis, allergies, low b12 and Vit d       HPI:  Scott Curtis is a 32 y.o. male  Here with 2-3 days acute onset fever, facial pain, pressure, headache, general weakness and malaise, and greenish d/c, with mild ST and cough, but pt denies chest pain, wheezing, increased sob or doe, orthopnea, PND, increased LE swelling, palpitations, dizziness or syncope.  Does have several wks ongoing nasal allergy symptoms with clearish congestion, itch and sneezing, without fever, pain, ST, cough, swelling or wheezing.         Wt Readings from Last 3 Encounters:  07/21/23 296 lb (134.3 kg)  05/29/23 298 lb (135.2 kg)  10/02/22 296 lb (134.3 kg)   BP Readings from Last 3 Encounters:  07/21/23 120/74  05/29/23 124/74  12/09/22 130/82         Past Medical History:  Diagnosis Date   Development delay 09/14/2011   Impaired glucose tolerance 09/24/2012   Mental retardation 09/14/2011   Obesity 09/14/2011   Seizure disorder (HCC) 09/14/2011   Past Surgical History:  Procedure Laterality Date   NO PAST SURGERIES      reports that he has never smoked. He has never been exposed to tobacco smoke. He has never used smokeless tobacco. He reports that he does not drink alcohol and does not use drugs. family history includes Arthritis in an other family member; Cancer in an other family member; Hypertension in an other family member. Allergies  Allergen Reactions   Codeine Hives   Penicillin G Rash    Has patient had a PCN reaction causing immediate rash, facial/tongue/throat swelling, SOB or lightheadedness with hypotension: Unknown Has patient had a PCN reaction causing severe rash involving mucus membranes or skin necrosis: No Has patient had a PCN reaction that required hospitalization: No Has patient had a PCN reaction occurring within the last 10 years: No If all of the above answers are  "NO", then may proceed with Cephalosporin use.    Current Outpatient Medications on File Prior to Visit  Medication Sig Dispense Refill   lamoTRIgine (LAMICTAL) 100 MG tablet Take 100 mg by mouth daily.     escitalopram (LEXAPRO) 20 MG tablet Take 1 tablet (20 mg total) by mouth daily. (Patient not taking: Reported on 07/21/2023) 90 tablet 3   predniSONE (DELTASONE) 10 MG tablet 3 tabs by mouth per day for 3 days,2tabs per day for 3 days,1tab per day for 3 days (Patient not taking: Reported on 07/21/2023) 18 tablet 0   triamcinolone cream (KENALOG) 0.1 % Apply 1 Application topically 2 (two) times daily. (Patient not taking: Reported on 07/21/2023) 30 g 0   No current facility-administered medications on file prior to visit.        ROS:  All others reviewed and negative.  Objective        PE:  BP 120/74 (BP Location: Left Arm, Patient Position: Sitting, Cuff Size: Normal)   Pulse 92   Temp 97.6 F (36.4 C) (Oral)   Ht 5' (1.524 m)   Wt 296 lb (134.3 kg)   SpO2 99%   BMI 57.81 kg/m                 Constitutional: Pt appears in NAD               HENT: Head: NCAT.  Right Ear: External ear normal.                 Left Ear: External ear normal. Bilat tm's with mild erythema.  Max sinus areas mild tender.  Pharynx with mild erythema, no exudate                Eyes: . Pupils are equal, round, and reactive to light. Conjunctivae and EOM are normal               Nose: without d/c or deformity               Neck: Neck supple. Gross normal ROM               Cardiovascular: Normal rate and regular rhythm.                 Pulmonary/Chest: Effort normal and breath sounds without rales or wheezing.                               Neurological: Pt is alert. At baseline orientation, motor grossly intact               Skin: Skin is warm. No rashes, no other new lesions, LE edema - none               Psychiatric: Pt behavior is normal without agitation   Micro: none  Cardiac  tracings I have personally interpreted today:  none  Pertinent Radiological findings (summarize): none   Lab Results  Component Value Date   WBC 7.3 08/23/2022   HGB 14.2 08/23/2022   HCT 42.9 08/23/2022   PLT 279.0 08/23/2022   GLUCOSE 104 (H) 08/23/2022   CHOL 202 (H) 08/23/2022   TRIG 91.0 08/23/2022   HDL 38.00 (L) 08/23/2022   LDLCALC 146 (H) 08/23/2022   ALT 18 08/23/2022   AST 15 08/23/2022   NA 141 08/23/2022   K 4.1 08/23/2022   CL 103 08/23/2022   CREATININE 0.70 08/23/2022   BUN 12 08/23/2022   CO2 30 08/23/2022   TSH 2.01 08/23/2022   HGBA1C 6.1 08/23/2022   Assessment/Plan:  Scott Curtis is a 32 y.o. Black or African American [2] male with  has a past medical history of Development delay (09/14/2011), Impaired glucose tolerance (09/24/2012), Mental retardation (09/14/2011), Obesity (09/14/2011), and Seizure disorder (HCC) (09/14/2011).  Allergic rhinitis Mild to mod, for depomedrol 80 mgIM,  to f/u any worsening symptoms or concerns  Acute upper respiratory infection Mild to mod, for antibx course levaquin 500 every day and cough med prn,  to f/u any worsening symptoms or concerns  Hyperglycemia Lab Results  Component Value Date   HGBA1C 6.1 08/23/2022   Stable, pt to continue current medical treatment  - diet, wt control   B12 deficiency Lab Results  Component Value Date   VITAMINB12 601 08/23/2022   Stable, cont oral replacement - b12 1000 mcg qd   Vitamin D deficiency Last vitamin D Lab Results  Component Value Date   VD25OH 7.85 (L) 08/23/2022   Low, to start oral replacement  Followup: Return if symptoms worsen or fail to improve.  [name], MD 07/21/2023 8:01 PM [facility]

## 2023-07-21 NOTE — Assessment & Plan Note (Signed)
Mild to mod, for depomedrol 80 mg IM, to f/u any worsening symptoms or concerns 

## 2023-07-21 NOTE — Assessment & Plan Note (Signed)
Last vitamin D Lab Results  Component Value Date   VD25OH 7.85 (L) 08/23/2022   Low, to start oral replacement

## 2023-07-21 NOTE — Assessment & Plan Note (Signed)
Lab Results  Component Value Date   VITAMINB12 601 08/23/2022   Stable, cont oral replacement - b12 1000 mcg qd

## 2023-07-21 NOTE — Assessment & Plan Note (Signed)
Mild to mod, for antibx course levaquin 500 every day and cough med prn,  to f/u any worsening symptoms or concerns

## 2023-07-21 NOTE — Patient Instructions (Signed)
You had the steroid shot today  Please take all new medication as prescribed - the antibiotic, and cough medicine as needed  You can also take Mucinex (or it's generic off brand) for congestion, and tylenol as needed for pain.  Please continue all other medications as before, and refills have been done if requested.  Please have the pharmacy call with any other refills you may need.  Please continue your efforts at being more active, low cholesterol diet, and weight control.  Please keep your appointments with your specialists as you may have planned

## 2023-07-21 NOTE — Assessment & Plan Note (Signed)
Lab Results  Component Value Date   HGBA1C 6.1 08/23/2022   Stable, pt to continue current medical treatment  - diet, wt control

## 2023-07-30 DIAGNOSIS — G4733 Obstructive sleep apnea (adult) (pediatric): Secondary | ICD-10-CM | POA: Diagnosis not present

## 2023-08-30 DIAGNOSIS — G4733 Obstructive sleep apnea (adult) (pediatric): Secondary | ICD-10-CM | POA: Diagnosis not present

## 2023-09-27 ENCOUNTER — Other Ambulatory Visit: Payer: Self-pay | Admitting: Internal Medicine

## 2023-09-27 DIAGNOSIS — G4733 Obstructive sleep apnea (adult) (pediatric): Secondary | ICD-10-CM | POA: Diagnosis not present

## 2023-09-29 ENCOUNTER — Other Ambulatory Visit: Payer: Self-pay

## 2023-10-03 ENCOUNTER — Encounter: Payer: Self-pay | Admitting: Internal Medicine

## 2023-10-03 ENCOUNTER — Ambulatory Visit: Payer: 59 | Admitting: Internal Medicine

## 2023-10-03 ENCOUNTER — Ambulatory Visit: Payer: 59

## 2023-10-03 VITALS — BP 122/80 | HR 84 | Ht 61.0 in | Wt 297.0 lb

## 2023-10-03 VITALS — BP 122/80 | HR 84 | Temp 97.6°F | Ht 61.0 in | Wt 297.0 lb

## 2023-10-03 DIAGNOSIS — R21 Rash and other nonspecific skin eruption: Secondary | ICD-10-CM | POA: Diagnosis not present

## 2023-10-03 DIAGNOSIS — M7040 Prepatellar bursitis, unspecified knee: Secondary | ICD-10-CM | POA: Insufficient documentation

## 2023-10-03 DIAGNOSIS — M7041 Prepatellar bursitis, right knee: Secondary | ICD-10-CM

## 2023-10-03 DIAGNOSIS — R739 Hyperglycemia, unspecified: Secondary | ICD-10-CM | POA: Diagnosis not present

## 2023-10-03 DIAGNOSIS — E7800 Pure hypercholesterolemia, unspecified: Secondary | ICD-10-CM | POA: Diagnosis not present

## 2023-10-03 DIAGNOSIS — E538 Deficiency of other specified B group vitamins: Secondary | ICD-10-CM

## 2023-10-03 DIAGNOSIS — H9202 Otalgia, left ear: Secondary | ICD-10-CM

## 2023-10-03 DIAGNOSIS — J301 Allergic rhinitis due to pollen: Secondary | ICD-10-CM | POA: Diagnosis not present

## 2023-10-03 DIAGNOSIS — E785 Hyperlipidemia, unspecified: Secondary | ICD-10-CM | POA: Insufficient documentation

## 2023-10-03 DIAGNOSIS — E559 Vitamin D deficiency, unspecified: Secondary | ICD-10-CM

## 2023-10-03 DIAGNOSIS — Z0000 Encounter for general adult medical examination without abnormal findings: Secondary | ICD-10-CM

## 2023-10-03 DIAGNOSIS — Z0001 Encounter for general adult medical examination with abnormal findings: Secondary | ICD-10-CM | POA: Diagnosis not present

## 2023-10-03 LAB — BASIC METABOLIC PANEL
BUN: 10 mg/dL (ref 6–23)
CO2: 33 meq/L — ABNORMAL HIGH (ref 19–32)
Calcium: 10 mg/dL (ref 8.4–10.5)
Chloride: 101 meq/L (ref 96–112)
Creatinine, Ser: 0.76 mg/dL (ref 0.40–1.50)
GFR: 119.31 mL/min (ref >60.00)
Glucose, Bld: 93 mg/dL (ref 70–99)
Potassium: 4 meq/L (ref 3.5–5.1)
Sodium: 142 meq/L (ref 135–145)

## 2023-10-03 LAB — CBC WITH DIFFERENTIAL/PLATELET
Basophils Absolute: 0.1 10*3/uL (ref 0.0–0.1)
Basophils Relative: 0.8 % (ref 0.0–3.0)
Eosinophils Absolute: 0.1 10*3/uL (ref 0.0–0.7)
Eosinophils Relative: 1.7 % (ref 0.0–5.0)
HCT: 44.9 % (ref 39.0–52.0)
Hemoglobin: 14.5 g/dL (ref 13.0–17.0)
Lymphocytes Relative: 22.8 % (ref 12.0–46.0)
Lymphs Abs: 2 10*3/uL (ref 0.7–4.0)
MCHC: 32.2 g/dL (ref 30.0–36.0)
MCV: 85.8 fl (ref 78.0–100.0)
Monocytes Absolute: 0.7 10*3/uL (ref 0.1–1.0)
Monocytes Relative: 7.9 % (ref 3.0–12.0)
Neutro Abs: 5.9 10*3/uL (ref 1.4–7.7)
Neutrophils Relative %: 66.8 % (ref 43.0–77.0)
Platelets: 313 10*3/uL (ref 150.0–400.0)
RBC: 5.24 Mil/uL (ref 4.22–5.81)
RDW: 15.2 % (ref 11.5–15.5)
WBC: 8.8 10*3/uL (ref 4.0–10.5)

## 2023-10-03 LAB — TSH: TSH: 2.37 u[IU]/mL (ref 0.35–5.50)

## 2023-10-03 LAB — URINALYSIS, ROUTINE W REFLEX MICROSCOPIC
Bilirubin Urine: NEGATIVE
Hgb urine dipstick: NEGATIVE
Ketones, ur: NEGATIVE
Leukocytes,Ua: NEGATIVE
Nitrite: NEGATIVE
RBC / HPF: NONE SEEN (ref 0–?)
Specific Gravity, Urine: 1.025 (ref 1.000–1.030)
Total Protein, Urine: 30 — AB
Urine Glucose: NEGATIVE
Urobilinogen, UA: 0.2 (ref 0.0–1.0)
pH: 6 (ref 5.0–8.0)

## 2023-10-03 LAB — LIPID PANEL
Cholesterol: 203 mg/dL — ABNORMAL HIGH (ref 0–200)
HDL: 45.3 mg/dL (ref >39.00)
LDL Cholesterol: 131 mg/dL — ABNORMAL HIGH (ref 0–99)
NonHDL: 158.04
Total CHOL/HDL Ratio: 4
Triglycerides: 137 mg/dL (ref 0.0–149.0)
VLDL: 27.4 mg/dL (ref 0.0–40.0)

## 2023-10-03 LAB — HEPATIC FUNCTION PANEL
ALT: 22 U/L (ref 0–53)
AST: 18 U/L (ref 0–37)
Albumin: 4.4 g/dL (ref 3.5–5.2)
Alkaline Phosphatase: 81 U/L (ref 39–117)
Bilirubin, Direct: 0.2 mg/dL (ref 0.0–0.3)
Total Bilirubin: 0.6 mg/dL (ref 0.2–1.2)
Total Protein: 7.9 g/dL (ref 6.0–8.3)

## 2023-10-03 LAB — HEMOGLOBIN A1C: Hgb A1c MFr Bld: 6.2 % (ref 4.6–6.5)

## 2023-10-03 LAB — VITAMIN D 25 HYDROXY (VIT D DEFICIENCY, FRACTURES): VITD: <7 ng/mL — ABNORMAL LOW (ref 30.00–100.00)

## 2023-10-03 LAB — VITAMIN B12: Vitamin B-12: 343 pg/mL (ref 211–911)

## 2023-10-03 MED ORDER — ESCITALOPRAM OXALATE 20 MG PO TABS: 20.0000 mg | ORAL_TABLET | Freq: Every day | ORAL | 3 refills | Status: AC

## 2023-10-03 MED ORDER — METHYLPREDNISOLONE ACETATE 80 MG/ML IJ SUSP
80.0000 mg | Freq: Once | INTRAMUSCULAR | Status: AC
  Administered 2023-10-03: 80 mg via INTRAMUSCULAR

## 2023-10-03 MED ORDER — NYSTATIN 100000 UNIT/GM EX POWD: CUTANEOUS | 1 refills | Status: AC

## 2023-10-03 MED ORDER — AZITHROMYCIN 250 MG PO TABS: ORAL_TABLET | ORAL | 1 refills | Status: AC

## 2023-10-03 MED ORDER — PREDNISONE 10 MG PO TABS: ORAL_TABLET | ORAL | 0 refills | Status: DC

## 2023-10-03 NOTE — Progress Notes (Signed)
 The test results show that your current treatment is OK, as the tests are stable.  Please continue the same plan.  There is no other need for change of treatment or further evaluation based on these results, at this time.  thanks

## 2023-10-03 NOTE — Progress Notes (Signed)
 Patient ID: Scott Curtis, male   DOB: [birth year], 33 y.o.   MRN: [id number]         Chief Complaint:: wellness exam and left ear pain x 2 days, low vit d, hld       HPI:  Scott Curtis is a 33 y.o. male here for wellness exam; for tdap at pharmacy, declines flu shot, delcines hiv screen, o/w up to date                     Also has had 2 days onset left ear pain with feverish and slight d/c.  Pt denies chest pain, increased sob or doe, wheezing, orthopnea, PND, increased LE swelling, palpitations, dizziness or syncope.   Pt denies polydipsia, polyuria, or new focal neuro s/s.   Pt denies fever, wt loss, night sweats, loss of appetite, or other constitutional symptoms  Spends time on his knees with sore swelling right anterior kneecap.  Does have several wks ongoing nasal allergy symptoms with clearish congestion, itch and sneezing, without fever, pain, ST, cough, swelling or wheezing.   Wt Readings from Last 3 Encounters:  10/03/23 297 lb (134.7 kg)  10/03/23 297 lb (134.7 kg)  07/21/23 296 lb (134.3 kg)   BP Readings from Last 3 Encounters:  10/03/23 122/80  10/03/23 122/80  07/21/23 120/74   Immunization History  Administered Date(s) Administered   PFIZER(Purple Top)SARS-COV-2 Vaccination 11/24/2019, 12/27/2019   Td 06/15/2004   Health Maintenance Due  Topic Date Due   HIV Screening  Never done   DTaP/Tdap/Td (2 - Tdap) 06/15/2014   INFLUENZA VACCINE  Never done      Past Medical History:  Diagnosis Date   Development delay 09/14/2011   Impaired glucose tolerance 09/24/2012   Mental retardation 09/14/2011   Obesity 09/14/2011   Seizure disorder (HCC) 09/14/2011   Past Surgical History:  Procedure Laterality Date   NO PAST SURGERIES      reports that he has never smoked. He has never been exposed to tobacco smoke. He has never used smokeless tobacco. He reports that he does not drink alcohol and does not use drugs. family history includes Arthritis in an other family member;  Cancer in an other family member; Hypertension in an other family member. Allergies  Allergen Reactions   Codeine Hives   Penicillin G Rash    Has patient had a PCN reaction causing immediate rash, facial/tongue/throat swelling, SOB or lightheadedness with hypotension: Unknown Has patient had a PCN reaction causing severe rash involving mucus membranes or skin necrosis: No Has patient had a PCN reaction that required hospitalization: No Has patient had a PCN reaction occurring within the last 10 years: No If all of the above answers are "NO", then may proceed with Cephalosporin use.    Current Outpatient Medications on File Prior to Visit  Medication Sig Dispense Refill   lamoTRIgine (LAMICTAL) 100 MG tablet Take 100 mg by mouth daily. (Patient not taking: Reported on 10/03/2023)     No current facility-administered medications on file prior to visit.        ROS:  All others reviewed and negative.  Objective        PE:  BP 122/80 (BP Location: Right Arm, Patient Position: Sitting, Cuff Size: Normal)   Pulse 84   Temp 97.6 F (36.4 C) (Oral)   Ht 5\' 1"  (1.549 m)   Wt 297 lb (134.7 kg)   SpO2 99%   BMI 56.12 kg/m  Constitutional: Pt appears in NAD               HENT: Head: NCAT.                Right Ear: External ear normal.                 Left Ear: External ear normal.                Eyes: . Pupils are equal, round, and reactive to light. Conjunctivae and EOM are normal               Nose: without d/c or deformity               Neck: Neck supple. Gross normal ROM               Cardiovascular: Normal rate and regular rhythm.                 Pulmonary/Chest: Effort normal and breath sounds without rales or wheezing.                Abd:  Soft, NT, ND, + BS, no organomegaly               Neurological: Pt is alert. At baseline orientation, motor grossly intact               Skin: Skin is warm. Has right groin nontender erythem  rashes, no other new lesions, LE  edema - none;  right anterior knee with bursal swelling tender               Psychiatric: Pt behavior is normal without agitation   Micro: none  Cardiac tracings I have personally interpreted today:  none  Pertinent Radiological findings (summarize): none   Lab Results  Component Value Date   WBC 8.8 10/03/2023   HGB 14.5 10/03/2023   HCT 44.9 10/03/2023   PLT 313.0 10/03/2023   GLUCOSE 93 10/03/2023   CHOL 203 (H) 10/03/2023   TRIG 137.0 10/03/2023   HDL 45.30 10/03/2023   LDLCALC 131 (H) 10/03/2023   ALT 22 10/03/2023   AST 18 10/03/2023   NA 142 10/03/2023   K 4.0 10/03/2023   CL 101 10/03/2023   CREATININE 0.76 10/03/2023   BUN 10 10/03/2023   CO2 33 (H) 10/03/2023   TSH 2.37 10/03/2023   HGBA1C 6.2 10/03/2023   Assessment/Plan:  Scott Curtis is a 33 y.o. Black or African American [2] male with  has a past medical history of Development delay (09/14/2011), Impaired glucose tolerance (09/24/2012), Mental retardation (09/14/2011), Obesity (09/14/2011), and Seizure disorder (HCC) (09/14/2011).  Encounter for well adult exam with abnormal findings Age and sex appropriate education and counseling updated with regular exercise and diet Referrals for preventative services - none needed Immunizations addressed - for tdap at pharmacy, declines flu shot Smoking counseling  - none needed Evidence for depression or other mood disorder - none significant Most recent labs reviewed. I have personally reviewed and have noted: 1) the patient's medical and social history 2) The patient's current medications and supplements 3) The patient's height, weight, and BMI have been recorded in the chart   Allergic rhinitis Mild to mod, for depomedrol 80 g IM,  to f/u any worsening symptoms or concerns  B12 deficiency Lab Results  Component Value Date   VITAMINB12 343 10/03/2023   Stable, cont oral replacement - b12 1000 mcg qd   Hyperglycemia Lab Results  Component Value Date    HGBA1C 6.2 10/03/2023   Stable, pt to continue current medical treatment  - diet,wt control  Rash C/w fungal rash right groin - for nystatin powder prn asd  Vitamin D deficiency Last vitamin D Lab Results  Component Value Date   VD25OH <7.00 (L) 10/03/2023   Low, to start oral replacement   Prepatellar bursitis Mild to mod, for prednisone taper, to f/u any worsening symptoms or concerns  HLD (hyperlipidemia) Lab Results  Component Value Date   LDLCALC 131 (H) 10/03/2023   Uncontrolled, for low chol diet, delcines statin   Left ear pain Mild to mod, for antibx course zpack,  to f/u any worsening symptoms or concerns, mother states will f/u with ENT soon  Followup: Return in about 6 months (around 04/04/2024).  [name], MD 10/03/2023 9:49 PM [facility] - [facility]

## 2023-10-03 NOTE — Assessment & Plan Note (Signed)
 Age and sex appropriate education and counseling updated with regular exercise and diet Referrals for preventative services - none needed Immunizations addressed - for tdap at pharmacy, declines flu shot Smoking counseling  - none needed Evidence for depression or other mood disorder - none significant Most recent labs reviewed. I have personally reviewed and have noted: 1) the patient's medical and social history 2) The patient's current medications and supplements 3) The patient's height, weight, and BMI have been recorded in the chart

## 2023-10-03 NOTE — Progress Notes (Signed)
 Subjective:   Scott Curtis is a 33 y.o. who presents for a Medicare Wellness preventive visit.  Visit Complete: In person   Persons Participating in Visit: Patient assisted by mother.  AWV Questionnaire: No: Patient Medicare AWV questionnaire was not completed prior to this visit.  Cardiac Risk Factors include: advanced age (>103men, >81 women);male gender     Objective:    There were no vitals filed for this visit. There is no height or weight on file to calculate BMI.     10/03/2023    1:20 PM 10/02/2022    3:37 PM 08/11/2017    8:04 PM 07/25/2016   12:51 PM  Advanced Directives  Does Patient Have a Medical Advance Directive? Yes No No No  Type of Designer, multimedia of Healthcare Power of Attorney in Chart? No - copy requested     Would patient like information on creating a medical advance directive?  No - Patient declined No - Patient declined     Current Medications (verified) Outpatient Encounter Medications as of 10/03/2023  Medication Sig   escitalopram (LEXAPRO) 20 MG tablet Take 1 tablet (20 mg total) by mouth daily.   lamoTRIgine (LAMICTAL) 100 MG tablet Take 100 mg by mouth daily. (Patient not taking: Reported on 10/03/2023)   levofloxacin (LEVAQUIN) 500 MG tablet Take 1 tablet (500 mg total) by mouth daily. (Patient not taking: Reported on 10/03/2023)   predniSONE (DELTASONE) 10 MG tablet 3 tabs by mouth per day for 3 days,2tabs per day for 3 days,1tab per day for 3 days (Patient not taking: Reported on 10/03/2023)   triamcinolone cream (KENALOG) 0.1 % APPLY TO AFFECTED AREA TWICE A DAY (Patient not taking: Reported on 10/03/2023)   No facility-administered encounter medications on file as of 10/03/2023.    Allergies (verified) Codeine and Penicillin g   History: Past Medical History:  Diagnosis Date   Development delay 09/14/2011   Impaired glucose tolerance 09/24/2012   Mental retardation 09/14/2011   Obesity 09/14/2011    Seizure disorder (HCC) 09/14/2011   Past Surgical History:  Procedure Laterality Date   NO PAST SURGERIES     Family History  Problem Relation Age of Onset   Arthritis Other    Hypertension Other    Cancer Other        breast cancer   Neuropathy Neg Hx    Social History   Socioeconomic History   Marital status: Single    Spouse name: Not on file   Number of children: Not on file   Years of education: Not on file   Highest education level: Not on file  Occupational History   Occupation: N/A  Tobacco Use   Smoking status: Never    Passive exposure: Never   Smokeless tobacco: Never  Vaping Use   Vaping status: Never Used  Substance and Sexual Activity   Alcohol use: No   Drug use: No   Sexual activity: Not on file  Other Topics Concern   Not on file  Social History Narrative   Lives at home w/ his mom   Caffeine: occasional tea or soda   Social Drivers of Health   Financial Resource Strain: Low Risk  (10/02/2022)   Overall Financial Resource Strain (CARDIA)    Difficulty of Paying Living Expenses: Not hard at all  Food Insecurity: No Food Insecurity (10/02/2022)   Hunger Vital Sign    Worried About Running Out of Food in the  Last Year: Never true    Ran Out of Food in the Last Year: Never true  Transportation Needs: No Transportation Needs (10/02/2022)   PRAPARE - Administrator, Civil Service (Medical): No    Lack of Transportation (Non-Medical): No  Physical Activity: Inactive (10/03/2023)   Exercise Vital Sign    Days of Exercise per Week: 0 days    Minutes of Exercise per Session: 0 min  Stress: No Stress Concern Present (10/02/2022)   [employer] of Occupational Health - Occupational Stress Questionnaire    Feeling of Stress : Not at all  Social Connections: Patient Declined (10/02/2022)   Social Connection and Isolation Panel [NHANES]    Frequency of Communication with Friends and Family: Patient declined    Frequency of Social Gatherings  with Friends and Family: Patient declined    Attends Religious Services: Patient declined    Database administrator or Organizations: Patient declined    Attends Engineer, structural: Patient declined    Marital Status: Patient declined    Tobacco Counseling Counseling given: Not Answered    Clinical Intake:  Pre-visit preparation completed: Yes  Pain : 0-10 Pain Type:  (rt ear pain, starting today)     Nutritional Risks: None  How often do you need to have someone help you when you read instructions, pamphlets, or other written materials from your doctor or pharmacy?: 5 - Always     Comments: Mom is here with patient today Information entered by :: Rainna Nearhood, RMA   Activities of Daily Living     10/03/2023    1:06 PM  In your present state of health, do you have any difficulty performing the following activities:  Hearing? 0  Vision? 0  Difficulty concentrating or making decisions? 0  Walking or climbing stairs? 0  Dressing or bathing? 0  Doing errands, shopping? 0  Comment mom drives him  Preparing Food and eating ? N  Using the Toilet? N  In the past six months, have you accidently leaked urine? N  Do you have problems with loss of bowel control? N  Managing your Medications? N  Managing your Finances? N  Housekeeping or managing your Housekeeping? N    Patient Care Team: Corwin Levins, MD as PCP - General (Internal Medicine)  Indicate any recent Medical Services you may have received from other than [facility] providers in the past year (date may be approximate).     Assessment:   This is a routine wellness examination for Scott Curtis.  Hearing/Vision screen Hearing Screening - Comments:: Denies hearing difficulties   Vision Screening - Comments:: Wear eyeglasses   Goals Addressed   None    Depression Screen DID NOT ASK- DUE TO PATIENT"S CONDITION    10/02/2022    3:49 PM 08/23/2022   10:35 AM 08/23/2022   10:04 AM 06/03/2022   10:11 AM  02/21/2022    3:06 PM 08/23/2021    9:09 AM 08/23/2021    8:53 AM  PHQ 2/9 Scores  PHQ - 2 Score 0 0 2 3 0 1 0  PHQ- 9 Score 8  12 10 2       Fall Risk     10/03/2023    1:20 PM 10/02/2022    3:38 PM 08/23/2022   10:35 AM 06/03/2022   10:06 AM 02/21/2022    3:06 PM  Fall Risk   Falls in the past year? 0 0 0 1 0  Number falls in past yr:  0 0 0 1 0  Injury with Fall? 0 0 0 1 0  Risk for fall due to : No Fall Risks No Fall Risks  No Fall Risks   Follow up Falls prevention discussed;Falls evaluation completed Falls prevention discussed  Falls evaluation completed     MEDICARE RISK AT HOME:  Medicare Risk at Home Any stairs in or around the home?: Yes If so, are there any without handrails?: Yes Home free of loose throw rugs in walkways, pet beds, electrical cords, etc?: Yes Adequate lighting in your home to reduce risk of falls?: Yes Life alert?: No Use of a cane, walker or w/c?: No Grab bars in the bathroom?: No Shower chair or bench in shower?: No Elevated toilet seat or a handicapped toilet?: No  TIMED UP AND GO:  Was the test performed?  Yes  Length of time to ambulate 10 feet: 20 sec Gait steady and fast without use of assistive device  Cognitive Function: Declined: Patient declined cognitive screening, but was able to answer questions in an accurate and timely manner. No cognitive impairments observed.        Immunizations Immunization History  Administered Date(s) Administered   PFIZER(Purple Top)SARS-COV-2 Vaccination 11/24/2019, 12/27/2019   Td 06/15/2004    Screening Tests Health Maintenance  Topic Date Due   HIV Screening  Never done   DTaP/Tdap/Td (2 - Tdap) 06/15/2014   INFLUENZA VACCINE  Never done   COVID-19 Vaccine (3 - 2024-25 season) 03/23/2023   Medicare Annual Wellness (AWV)  10/02/2023   Hepatitis C Screening  Completed   HPV VACCINES  Aged Out    Health Maintenance  Health Maintenance Due  Topic Date Due   HIV Screening  Never done    DTaP/Tdap/Td (2 - Tdap) 06/15/2014   INFLUENZA VACCINE  Never done   COVID-19 Vaccine (3 - 2024-25 season) 03/23/2023   Medicare Annual Wellness (AWV)  10/02/2023   Health Maintenance Items Addressed: See Nurse Notes  Additional Screening:  Vision Screening: Recommended annual ophthalmology exams for early detection of glaucoma and other disorders of the eye.  Dental Screening: Recommended annual dental exams for proper oral hygiene  Community Resource Referral / Chronic Care Management: CRR required this visit?  Yes   CCM required this visit?  No     Plan:     I have personally reviewed and noted the following in the patient's chart:   Medical and social history Use of alcohol, tobacco or illicit drugs  Current medications and supplements including opioid prescriptions. Patient is not currently taking opioid prescriptions. Functional ability and status Nutritional status Physical activity Advanced directives List of other physicians Hospitalizations, surgeries, and ER visits in previous 12 months Vitals Screenings to include cognitive, depression, and falls Referrals and appointments  In addition, I have reviewed and discussed with patient certain preventive protocols, quality metrics, and best practice recommendations. A written personalized care plan for preventive services as well as general preventive health recommendations were provided to patient.     Elaine Roanhorse L Darrell Hauk, CMA   10/03/2023   After Visit Summary: (In Person-Printed) AVS printed and given to the patient  Notes: Please refer to Routing Comments.

## 2023-10-03 NOTE — Assessment & Plan Note (Signed)
 Lab Results  Component Value Date   LDLCALC 131 (H) 10/03/2023   Uncontrolled, for low chol diet, delcines statin

## 2023-10-03 NOTE — Assessment & Plan Note (Signed)
 Mild to mod, for depomedrol 80 g IM,  to f/u any worsening symptoms or concerns

## 2023-10-03 NOTE — Assessment & Plan Note (Signed)
 Last vitamin D Lab Results  Component Value Date   VD25OH <7.00 (L) 10/03/2023   Low, to start oral replacement

## 2023-10-03 NOTE — Assessment & Plan Note (Signed)
 Lab Results  Component Value Date   VITAMINB12 343 10/03/2023   Stable, cont oral replacement - b12 1000 mcg qd

## 2023-10-03 NOTE — Assessment & Plan Note (Addendum)
 Mild to mod, for antibx course zpack,  to f/u any worsening symptoms or concerns, mother states will f/u with ENT soon

## 2023-10-03 NOTE — Assessment & Plan Note (Signed)
 Mild to mod, for prednisone taper, to f/u any worsening symptoms or concerns

## 2023-10-03 NOTE — Assessment & Plan Note (Signed)
 C/w fungal rash right groin - for nystatin powder prn asd

## 2023-10-03 NOTE — Patient Instructions (Signed)
 Please take all new medication as prescribed  - the antibiotic, and prednisone, and nystatin powder for the rash  You had the steroid shot today  Please continue all other medications as before, and refills have been done if requested.  Please have the pharmacy call with any other refills you may need.  Please continue your efforts at being more active, low cholesterol diet, and weight control.  You are otherwise up to date with prevention measures today.  Please keep your appointments with your specialists as you may have planned  Please go to the LAB at the blood drawing area for the tests to be done  You will be contacted by phone if any changes need to be made immediately.  Otherwise, you will receive a letter about your results with an explanation, but please check with MyChart first.  Please make an Appointment to return in 6 months, or sooner if needed

## 2023-10-03 NOTE — Patient Instructions (Addendum)
 Mr. Scott Curtis , Thank you for taking time to come for your Medicare Wellness Visit. I appreciate your ongoing commitment to your health goals. Please review the following plan we discussed and let me know if I can assist you in the future.   Referrals/Orders/Follow-Ups/Clinician Recommendations: It was nice to meet you today.  Aim for 30 minutes of exercise or brisk walking, 6-8 glasses of water, and 5 servings of fruits and vegetables each day.   This is a list of the screening recommended for you and due dates:  Health Maintenance  Topic Date Due   HIV Screening  Never done   DTaP/Tdap/Td vaccine (1 - Tdap) Never done   Flu Shot  Never done   COVID-19 Vaccine (3 - 2024-25 season) 03/23/2023   Medicare Annual Wellness Visit  10/02/2023   Hepatitis C Screening  Completed   HPV Vaccine  Aged Out    Advanced directives: (Copy Requested) Please bring a copy of your health care power of attorney and living will to the office to be added to your chart at your convenience. You can mail to [facility] [address] [place], [place] [address] or email to [email or web address]  Next Medicare Annual Wellness Visit scheduled for next year: Yes

## 2023-10-03 NOTE — Assessment & Plan Note (Signed)
 Lab Results  Component Value Date   HGBA1C 6.2 10/03/2023   Stable, pt to continue current medical treatment  - diet,wt control

## 2023-10-29 DIAGNOSIS — G4733 Obstructive sleep apnea (adult) (pediatric): Secondary | ICD-10-CM | POA: Diagnosis not present

## 2023-11-28 ENCOUNTER — Ambulatory Visit (INDEPENDENT_AMBULATORY_CARE_PROVIDER_SITE_OTHER): Admitting: Internal Medicine

## 2023-11-28 ENCOUNTER — Encounter: Payer: Self-pay | Admitting: Internal Medicine

## 2023-11-28 VITALS — BP 124/78 | HR 97 | Temp 97.6°F | Ht 61.0 in | Wt 302.0 lb

## 2023-11-28 DIAGNOSIS — J4531 Mild persistent asthma with (acute) exacerbation: Secondary | ICD-10-CM

## 2023-11-28 DIAGNOSIS — E559 Vitamin D deficiency, unspecified: Secondary | ICD-10-CM | POA: Diagnosis not present

## 2023-11-28 DIAGNOSIS — T7840XA Allergy, unspecified, initial encounter: Secondary | ICD-10-CM

## 2023-11-28 DIAGNOSIS — M25462 Effusion, left knee: Secondary | ICD-10-CM

## 2023-11-28 DIAGNOSIS — J301 Allergic rhinitis due to pollen: Secondary | ICD-10-CM

## 2023-11-28 DIAGNOSIS — G4733 Obstructive sleep apnea (adult) (pediatric): Secondary | ICD-10-CM | POA: Diagnosis not present

## 2023-11-28 MED ORDER — MONTELUKAST SODIUM 10 MG PO TABS: 10.0000 mg | ORAL_TABLET | Freq: Every day | ORAL | 3 refills | Status: AC

## 2023-11-28 MED ORDER — PREDNISONE 10 MG PO TABS: ORAL_TABLET | ORAL | 0 refills | Status: DC

## 2023-11-28 MED ORDER — METHYLPREDNISOLONE ACETATE 80 MG/ML IJ SUSP
80.0000 mg | Freq: Once | INTRAMUSCULAR | Status: AC
Start: 2023-11-28 — End: 2023-11-28
  Administered 2023-11-28: 80 mg via INTRAMUSCULAR

## 2023-11-28 NOTE — Patient Instructions (Signed)
 You had the steroid shot today  Please take all new medication as prescribed - the prednisone , and the singulair  10 mg per day  Please continue all other medications as before, and refills have been done if requested.  Please have the pharmacy call with any other refills you may need.  Please keep your appointments with your specialists as you may have planned

## 2023-11-28 NOTE — Progress Notes (Unsigned)
 Patient ID: Scott Curtis, male   DOB: May 25, 1991, 33 y.o.   MRN: [id number]        Chief Complaint: follow up eye and nasal allergies, asthma, left knee effusion after fall       HPI:  Scott Curtis is a 33 y.o. male here with mother - Does have several wks ongoing nasal allergy symptoms with clearish congestion, itch and sneezing, chest rattling nd wheezing, without fever, pain, ST, cough, swelling.   Pt denies fever, wt loss, night sweats, loss of appetite, or other constitutional symptoms   Pt denies polydipsia, polyuria, or new focal neuro s/s.  Also incidentally had fall to left knee 3 days ago, pain much improved now but large swelling persists.  No hx of gout       Wt Readings from Last 3 Encounters:  11/28/23 (!) 302 lb (137 kg)  10/03/23 297 lb (134.7 kg)  10/03/23 297 lb (134.7 kg)   BP Readings from Last 3 Encounters:  11/28/23 124/78  10/03/23 122/80  10/03/23 122/80         Past Medical History:  Diagnosis Date   Development delay 09/14/2011   Impaired glucose tolerance 09/24/2012   Mental retardation 09/14/2011   Obesity 09/14/2011   Seizure disorder (HCC) 09/14/2011   Past Surgical History:  Procedure Laterality Date   NO PAST SURGERIES      reports that he has never smoked. He has never been exposed to tobacco smoke. He has never used smokeless tobacco. He reports that he does not drink alcohol and does not use drugs. family history includes Arthritis in an other family member; Cancer in an other family member; Hypertension in an other family member. Allergies  Allergen Reactions   Codeine Hives   Penicillin G Rash    Has patient had a PCN reaction causing immediate rash, facial/tongue/throat swelling, SOB or lightheadedness with hypotension: Unknown Has patient had a PCN reaction causing severe rash involving mucus membranes or skin necrosis: No Has patient had a PCN reaction that required hospitalization: No Has patient had a PCN reaction occurring within the last  10 years: No If all of the above answers are "NO", then may proceed with Cephalosporin use.    Current Outpatient Medications on File Prior to Visit  Medication Sig Dispense Refill   albuterol  (VENTOLIN  HFA) 108 (90 Base) MCG/ACT inhaler SMARTSIG:2 Puff(s) By Mouth Every 6 Hours PRN     escitalopram  (LEXAPRO ) 20 MG tablet Take 1 tablet (20 mg total) by mouth daily. 90 tablet 3   fluocinonide  (LIDEX ) 0.05 % external solution Apply topically.     nystatin  (MYCOSTATIN /NYSTOP ) powder Use as directed twice per day as needed 60 g 1   triamcinolone  cream (KENALOG ) 0.1 % Apply topically.     lamoTRIgine (LAMICTAL) 100 MG tablet Take 100 mg by mouth daily. (Patient not taking: Reported on 11/28/2023)     No current facility-administered medications on file prior to visit.        ROS:  All others reviewed and negative.  Objective        PE:  BP 124/78 (BP Location: Right Arm, Patient Position: Sitting, Cuff Size: Normal)   Pulse 97   Temp 97.6 F (36.4 C) (Oral)   Ht 5\' 1"  (1.549 m)   Wt (!) 302 lb (137 kg)   SpO2 98%   BMI 57.06 kg/m                 Constitutional: Pt appears in NAD  HENT: Head: NCAT.                Right Ear: External ear normal.                 Left Ear: External ear normal. Bilat tm's with mild erythema.  Max sinus areas non tender.  Pharynx with mild erythema, no exudate               Eyes: . Pupils are equal, round, and reactive to light. Conjunctivae and EOM are normal               Nose: without d/c or deformity               Neck: Neck supple. Gross normal ROM               Cardiovascular: Normal rate and regular rhythm.                 Pulmonary/Chest: Effort normal and breath sounds with mild bilateral wheezing.                Abd:  Soft, NT, ND, + BS, no organomegaly               Neurological: Pt is alert. At baseline orientation, motor grossly intact               Skin: Skin is warm. No rashes, no other new lesions, LE edema - none                 Left knee effusion 2+ with minor tender, FROM               Psychiatric: Pt behavior is normal without agitation   Micro: none  Cardiac tracings I have personally interpreted today:  none  Pertinent Radiological findings (summarize): none   Lab Results  Component Value Date   WBC 8.8 10/03/2023   HGB 14.5 10/03/2023   HCT 44.9 10/03/2023   PLT 313.0 10/03/2023   GLUCOSE 93 10/03/2023   CHOL 203 (H) 10/03/2023   TRIG 137.0 10/03/2023   HDL 45.30 10/03/2023   LDLCALC 131 (H) 10/03/2023   ALT 22 10/03/2023   AST 18 10/03/2023   NA 142 10/03/2023   K 4.0 10/03/2023   CL 101 10/03/2023   CREATININE 0.76 10/03/2023   BUN 10 10/03/2023   CO2 33 (H) 10/03/2023   TSH 2.37 10/03/2023   HGBA1C 6.2 10/03/2023   Assessment/Plan:  [name] is a 33 y.o. Black or African American [2] male with  has a past medical history of Development delay (09/14/2011), Impaired glucose tolerance (09/24/2012), Mental retardation (09/14/2011), Obesity (09/14/2011), and Seizure disorder (HCC) (09/14/2011).  Allergic rhinitis Mild to mod, for depomedrol 80 mg IM, prednisone  taper, add singulair  10 mg every day,  to f/u any worsening symptoms or concerns  Asthma exacerbation Mild to mod, for above and inhaler prn,,  to f/u any worsening symptoms or concerns  Effusion, left knee This is c/w post traumatic effusion, exam o/w benign, afeb, pain minor, pt declines nsaid prn or xray  Vitamin D  deficiency Last vitamin D  Lab Results  Component Value Date   VD25OH <7.00 (L) 10/03/2023   Low, to start oral replacement  Followup: Return if symptoms worsen or fail to improve.  Rosalia Colonel, MD 11/29/2023 7:28 PM [facility]

## 2023-11-29 ENCOUNTER — Encounter: Payer: Self-pay | Admitting: Internal Medicine

## 2023-11-29 DIAGNOSIS — M25462 Effusion, left knee: Secondary | ICD-10-CM | POA: Insufficient documentation

## 2023-11-29 DIAGNOSIS — J45901 Unspecified asthma with (acute) exacerbation: Secondary | ICD-10-CM | POA: Insufficient documentation

## 2023-11-29 NOTE — Assessment & Plan Note (Signed)
 This is c/w post traumatic effusion, exam o/w benign, afeb, pain minor, pt declines nsaid prn or xray

## 2023-11-29 NOTE — Assessment & Plan Note (Signed)
 Last vitamin D Lab Results  Component Value Date   VD25OH <7.00 (L) 10/03/2023   Low, to start oral replacement

## 2023-11-29 NOTE — Assessment & Plan Note (Signed)
 Mild to mod, for depomedrol 80 mg IM, prednisone  taper, add singulair  10 mg every day,  to f/u any worsening symptoms or concerns

## 2023-11-29 NOTE — Assessment & Plan Note (Signed)
 Mild to mod, for above and inhaler prn,,  to f/u any worsening symptoms or concerns

## 2023-12-12 ENCOUNTER — Other Ambulatory Visit: Payer: Self-pay | Admitting: Internal Medicine

## 2023-12-12 ENCOUNTER — Ambulatory Visit: Payer: Self-pay

## 2023-12-12 MED ORDER — AZITHROMYCIN 250 MG PO TABS: ORAL_TABLET | ORAL | 1 refills | Status: AC

## 2023-12-12 NOTE — Telephone Encounter (Signed)
 Copied from CRM [id number]. Topic: Clinical - Medical Advice >> Dec 12, 2023  1:48 PM [place] E wrote: Reason for CRM: Patient's mother, Georjean Kite, called in stating that patient was seen on 5/9 for allergies and was given a shot at time of visit, mother stated this shot did not help and patient is still experiencing some congestion. Questioning if there is another antibiotic he can take to help with congestion. Callback number for mom is [phone].  Declined triage; patient not at home, mother called for him.  States he needs an antibiotic called in.

## 2023-12-12 NOTE — Telephone Encounter (Signed)
Ok for zpack - done erx 

## 2023-12-12 NOTE — Telephone Encounter (Signed)
 Called and let Pt mother know.

## 2023-12-26 ENCOUNTER — Ambulatory Visit: Payer: Self-pay

## 2023-12-26 ENCOUNTER — Ambulatory Visit
Admission: EM | Admit: 2023-12-26 | Discharge: 2023-12-26 | Disposition: A | Discharge status: Home / Self Care | Attending: Family Medicine | Admitting: Family Medicine

## 2023-12-26 DIAGNOSIS — J4521 Mild intermittent asthma with (acute) exacerbation: Secondary | ICD-10-CM

## 2023-12-26 DIAGNOSIS — J019 Acute sinusitis, unspecified: Secondary | ICD-10-CM

## 2023-12-26 MED ORDER — DOXYCYCLINE HYCLATE 100 MG PO CAPS: 100.0000 mg | ORAL_CAPSULE | Freq: Two times a day (BID) | ORAL | 0 refills | Status: AC

## 2023-12-26 MED ORDER — PREDNISONE 20 MG PO TABS: 40.0000 mg | ORAL_TABLET | Freq: Every day | ORAL | 0 refills | Status: AC

## 2023-12-26 MED ORDER — HYDROCODONE BIT-HOMATROP MBR 5-1.5 MG/5ML PO SOLN: 5.0000 mL | Freq: Four times a day (QID) | ORAL | 0 refills | Status: AC | PRN

## 2023-12-26 NOTE — ED Triage Notes (Signed)
 Here with Scott Curtis. "He had a sinus infection, given an antibiotic that didn't work and now having cough, congestion, a lot of mucous and the cough is causing him to have a hard time at night". No fever.

## 2023-12-26 NOTE — Telephone Encounter (Signed)
 FYI Only or Action Required?: action required, mother wants pt seen in office and declined ED  Patient was last seen in primary care on 11/28/2023 by Roslyn Coombe, MD. Called Nurse Triage reporting Cough and Shortness of Breath. Symptoms began several weeks ago. Interventions attempted: antibiotics prescribed . Symptoms are: gradually worsening.  Triage Disposition: Go to ED Now (Notify PCP)  Patient/caregiver understands and will follow disposition?: no               Reason for Disposition  [1] MODERATE difficulty breathing (e.g., speaks in phrases, SOB even at rest, pulse 100-120) AND [2] NEW-onset or WORSE than normal  Answer Assessment - Initial Assessment Questions 1. RESPIRATORY STATUS: "Describe your breathing?" (e.g., wheezing, shortness of breath, unable to speak, severe coughing)      Heavy breathing, cough, phlegm, worsening SOB at rest 2. ONSET: "When did this breathing problem begin?"      Worsening since 5/9 visit  3. PATTERN "Does the difficult breathing come and go, or has it been constant since it started?"      Come and go  4. SEVERITY: "How bad is your breathing?" (e.g., mild, moderate, severe)    - MILD: No SOB at rest, mild SOB with walking, speaks normally in sentences, can lie down, no retractions, pulse < 100.    - MODERATE: SOB at rest, SOB with minimal exertion and prefers to sit, cannot lie down flat, speaks in phrases, mild retractions, audible wheezing, pulse 100-120.    - SEVERE: Very SOB at rest, speaks in single words, struggling to breathe, sitting hunched forward, retractions, pulse > 120      Some SOB at rest that is worsening, "just wants to sleep" 5. RECURRENT SYMPTOM: "Have you had difficulty breathing before?" If Yes, ask: "When was the last time?" and "What happened that time?"      Yes, some at baseline  6. CARDIAC HISTORY: "Do you have any history of heart disease?" (e.g., heart attack, angina, bypass surgery, angioplasty)      No  7.  LUNG HISTORY: "Do you have any history of lung disease?"  (e.g., pulmonary embolus, asthma, emphysema)     OSA, sinus infections, asthma 8. CAUSE: "What do you think is causing the breathing problem?"      Bronchitis  9. OTHER SYMPTOMS: "Do you have any other symptoms? (e.g., dizziness, runny nose, cough, chest pain, fever)     Cough with lots of phlegm  No fever, no N/V, "keeping everything down"  10. O2 SATURATION MONITOR:  "Do you use an oxygen saturation monitor (pulse oximeter) at home?" If Yes, ask: "What is your reading (oxygen level) today?" "What is your usual oxygen saturation reading?" (e.g., 95%)  Protocols used: Breathing Difficulty-A-AH

## 2023-12-26 NOTE — Telephone Encounter (Signed)
 Agree - pt should go to [facility] ro ED if no appts available in office today

## 2023-12-26 NOTE — Telephone Encounter (Signed)
 Called and spoke with Pt mother and she said they were currently at a [facility] being seen.

## 2023-12-26 NOTE — Discharge Instructions (Signed)
 Take doxycycline  100 mg --1 capsule 2 times daily for 7 days  Take prednisone  20 mg--2 daily for 5 days  Hydrocodone -homatroprine 5-1.5 mg / 5 mL syrup--take 5 mL by mouth every 6 hours as needed for cough  Please follow-up with Dr. Autry Legions about this issue.  He may need more medicines for his asthma or he may need further evaluation of his sinuses

## 2023-12-26 NOTE — Telephone Encounter (Signed)
 Copied from CRM [id number]. Topic: Clinical - Red Word Triage >> Dec 26, 2023  8:44 AM Lovett Ruck C wrote: Red Word that prompted transfer to Nurse Triage: Patient has been coughing mucus up since he had last appointment on 5/9 so patient has gotten worse.

## 2023-12-26 NOTE — ED Provider Notes (Signed)
 [facility]    CSN: [id number] Arrival date & time: 12/26/23  1248      History   Chief Complaint Chief Complaint  Patient presents with   Cough   Nasal Congestion    HPI Scott Curtis is a 33 y.o. male.    Cough Here for cough and congestion and wheezing.  His mom brings him in.  On May 9 he was treated for possible allergies with a steroid shot and steroid taper.  It sounds like mom did not pick up the steroid taper.  On May 23 they called the primary care and they decided to send in azithromycin  to see if that would treat an acute sinusitis.  Apparently symptoms did improve on the antibiotic and then about 2 days later he began having rhinorrhea and wheezing and more coughing.  No fever at any point.  He therefore has had worsened cough and congestion for about 8 days or so.  He is allergic to penicillin which causes a rash and codeine causes hives.  She states that he tolerates hydrocodone  cough syrup.  He has none verbal and mom therefore helps provide history  Past Medical History:  Diagnosis Date   Development delay 09/14/2011   Impaired glucose tolerance 09/24/2012   Mental retardation 09/14/2011   Obesity 09/14/2011   Seizure disorder (HCC) 09/14/2011    Patient Active Problem List   Diagnosis Date Noted   Asthma exacerbation 11/29/2023   Effusion, left knee 11/29/2023   Prepatellar bursitis 10/03/2023   HLD (hyperlipidemia) 10/03/2023   Rash 05/31/2023   Congenital exotropia of right eye 11/04/2022   Watering of left eye 08/23/2022   Gynecomastia, male 06/03/2022   Impacted ear wax 08/23/2021   Hyperglycemia 08/23/2021   B12 deficiency 08/23/2021   Vitamin D  deficiency 08/23/2021   Boil, leg 02/24/2021   Nocturnal hypoxemia 07/11/2019   Cough 03/31/2019   Right conjunctivitis 03/02/2019   Acute sinus infection 12/06/2018   Obstructive sleep apnea 08/19/2018   Left ear pain 04/16/2018   Left inguinal hernia 10/16/2017   Irritability and  anger 08/13/2017   External otitis of right ear 08/13/2017   Lichen simplex chronicus 11/21/2016   Seborrheic dermatitis 11/21/2016   Elevated blood-pressure reading without diagnosis of hypertension 09/15/2016   Scalp lesion 09/10/2016   Acute upper respiratory infection 09/10/2016   Bilateral impacted cerumen 08/21/2016   Chronic mycotic otitis externa 08/21/2016   Rash and nonspecific skin eruption 04/06/2015   Allergic rhinitis 10/26/2014   Acne 09/24/2012   Allergic conjunctivitis 09/17/2011   Seizure disorder (HCC) 09/14/2011   Encounter for well adult exam with abnormal findings 09/14/2011   Development delay 09/14/2011   Intellectual disability 09/14/2011   Obesity 09/14/2011    Past Surgical History:  Procedure Laterality Date   NO PAST SURGERIES         Home Medications    Prior to Admission medications   Medication Sig Start Date End Date Taking? Authorizing Provider  doxycycline  (VIBRAMYCIN ) 100 MG capsule Take 1 capsule (100 mg total) by mouth 2 (two) times daily for 7 days. 12/26/23 01/02/24 Yes Kapil Petropoulos, Paige Boatman, MD  escitalopram  (LEXAPRO ) 20 MG tablet Take 1 tablet (20 mg total) by mouth daily. 10/03/23  Yes Roslyn Coombe, MD  HYDROcodone  bit-homatropine [facility]) 5-1.5 MG/5ML syrup Take 5 mLs by mouth every 6 (six) hours as needed for cough. 12/26/23  Yes Ann Keto, MD  predniSONE  (DELTASONE ) 20 MG tablet Take 2 tablets (40 mg total) by  mouth daily with breakfast for 5 days. 12/26/23 12/31/23 Yes Ann Keto, MD  albuterol  (VENTOLIN  HFA) 108 (90 Base) MCG/ACT inhaler SMARTSIG:2 Puff(s) By Mouth Every 6 Hours PRN 11/01/23   [provider]  fluocinonide  (LIDEX ) 0.05 % external solution Apply topically. 02/05/21   [provider]  lamoTRIgine (LAMICTAL) 100 MG tablet Take 100 mg by mouth daily. Patient not taking: Reported on 10/03/2023    [provider]  montelukast  (SINGULAIR ) 10 MG tablet Take 1 tablet (10 mg total) by mouth  at bedtime. 11/28/23   Roslyn Coombe, MD  nystatin  (MYCOSTATIN /NYSTOP ) powder Use as directed twice per day as needed 10/03/23   Roslyn Coombe, MD  triamcinolone  cream (KENALOG ) 0.1 % Apply topically. 02/05/21   [provider]    Family History Family History  Problem Relation Age of Onset   Arthritis Other    Hypertension Other    Cancer Other        breast cancer   Neuropathy Neg Hx     Social History Social History   Tobacco Use   Smoking status: Never    Passive exposure: Never   Smokeless tobacco: Never  Vaping Use   Vaping status: Never Used  Substance Use Topics   Alcohol use: No   Drug use: No     Allergies   Codeine and Penicillin g   Review of Systems Review of Systems  Respiratory:  Positive for cough.      Physical Exam Triage Vital Signs ED Triage Vitals  Encounter Vitals Group     BP 12/26/23 1322 111/78     Systolic BP Percentile --      Diastolic BP Percentile --      Pulse Rate 12/26/23 1322 95     Resp 12/26/23 1322 20     Temp 12/26/23 1322 97.6 F (36.4 C)     Temp Source 12/26/23 1322 Oral     SpO2 12/26/23 1322 96 %     Weight 12/26/23 1321 (!) 302 lb 0.5 oz (137 kg)     Height 12/26/23 1321 5\' 1"  (1.549 m)     Head Circumference --      Peak Flow --      Pain Score 12/26/23 1357 0     Pain Loc --      Pain Education --      Exclude from Growth Chart --    No data found.  Updated Vital Signs BP 111/78 (BP Location: Left Arm)   Pulse 95   Temp 97.6 F (36.4 C) (Oral)   Resp 20   Ht 5\' 1"  (1.549 m)   Wt (!) 137 kg   SpO2 96%   BMI 57.07 kg/m   Visual Acuity Right Eye Distance:   Left Eye Distance:   Bilateral Distance:    Right Eye Near:   Left Eye Near:    Bilateral Near:     Physical Exam Vitals reviewed.  Constitutional:      General: He is not in acute distress.    Appearance: He is not toxic-appearing.  HENT:     Right Ear: Tympanic membrane and ear canal normal.     Left Ear: Tympanic membrane  and ear canal normal.     Nose: Nose normal.     Mouth/Throat:     Mouth: Mucous membranes are moist.     Pharynx: No oropharyngeal exudate or posterior oropharyngeal erythema.  Eyes:     Extraocular Movements: Extraocular movements  intact.     Conjunctiva/sclera: Conjunctivae normal.     Pupils: Pupils are equal, round, and reactive to light.     Comments: There is a little tearing in both inner canthi but no discharge.  No injection  Cardiovascular:     Rate and Rhythm: Normal rate and regular rhythm.     Heart sounds: No murmur heard. Pulmonary:     Effort: No respiratory distress.     Breath sounds: No stridor. No wheezing, rhonchi or rales.  Musculoskeletal:     Cervical back: Neck supple.  Lymphadenopathy:     Cervical: No cervical adenopathy.  Skin:    Capillary Refill: Capillary refill takes less than 2 seconds.     Coloration: Skin is not jaundiced or pale.  Neurological:     General: No focal deficit present.     Mental Status: He is alert and oriented to person, place, and time.  Psychiatric:        Behavior: Behavior normal.      UC Treatments / Results  Labs (all labs ordered are listed, but only abnormal results are displayed) Labs Reviewed - No data to display  EKG   Radiology No results found.  Procedures Procedures (including critical care time)  Medications Ordered in UC Medications - No data to display  Initial Impression / Assessment and Plan / UC Course  I have reviewed the triage vital signs and the nursing notes.  Pertinent labs & imaging results that were available during my care of the patient were reviewed by me and considered in my medical decision making (see chart for details).     Doxycycline  is sent in for possible acute sinusitis and prednisone  is sent in for asthma exacerbation.  Hydrocodone  cough syrup sent in at [facility] request.  She requested another injection of steroids, but since he is not having any vomiting, I think the  prednisone  burst will do as well or better.  It sounds like he did not take the prednisone  taper prescribed in May  Follow-up with primary care about this issue Final Clinical Impressions(s) / UC Diagnoses   Final diagnoses:  Acute sinusitis, recurrence not specified, unspecified location  Mild intermittent asthma with (acute) exacerbation     Discharge Instructions      Take doxycycline  100 mg --1 capsule 2 times daily for 7 days  Take prednisone  20 mg--2 daily for 5 days  Hydrocodone -homatroprine 5-1.5 mg / 5 mL syrup--take 5 mL by mouth every 6 hours as needed for cough  Please follow-up with Dr. Autry Legions about this issue.  He may need more medicines for his asthma or he may need further evaluation of his sinuses  ED Prescriptions     Medication Sig Dispense Auth. Provider   doxycycline  (VIBRAMYCIN ) 100 MG capsule Take 1 capsule (100 mg total) by mouth 2 (two) times daily for 7 days. 14 capsule Wai Litt K, MD   predniSONE  (DELTASONE ) 20 MG tablet Take 2 tablets (40 mg total) by mouth daily with breakfast for 5 days. 10 tablet Beyonce Sawatzky, Paige Boatman, MD   HYDROcodone  bit-homatropine [facility]) 5-1.5 MG/5ML syrup Take 5 mLs by mouth every 6 (six) hours as needed for cough. 120 mL Ann Keto, MD      PDMP not reviewed this encounter.   Ann Keto, MD 12/26/23 [phone]

## 2024-01-20 DIAGNOSIS — G4733 Obstructive sleep apnea (adult) (pediatric): Secondary | ICD-10-CM | POA: Diagnosis not present

## 2024-01-21 DIAGNOSIS — H6123 Impacted cerumen, bilateral: Secondary | ICD-10-CM | POA: Diagnosis not present

## 2024-01-27 DIAGNOSIS — G4733 Obstructive sleep apnea (adult) (pediatric): Secondary | ICD-10-CM | POA: Diagnosis not present

## 2024-03-01 ENCOUNTER — Ambulatory Visit: Payer: Self-pay | Admitting: *Deleted

## 2024-03-01 ENCOUNTER — Ambulatory Visit (INDEPENDENT_AMBULATORY_CARE_PROVIDER_SITE_OTHER): Admitting: Internal Medicine

## 2024-03-01 ENCOUNTER — Encounter: Payer: Self-pay | Admitting: Internal Medicine

## 2024-03-01 VITALS — BP 112/84 | HR 62 | Temp 98.2°F | Ht 61.0 in | Wt 303.8 lb

## 2024-03-01 DIAGNOSIS — E538 Deficiency of other specified B group vitamins: Secondary | ICD-10-CM

## 2024-03-01 DIAGNOSIS — E559 Vitamin D deficiency, unspecified: Secondary | ICD-10-CM

## 2024-03-01 DIAGNOSIS — J301 Allergic rhinitis due to pollen: Secondary | ICD-10-CM

## 2024-03-01 DIAGNOSIS — J4531 Mild persistent asthma with (acute) exacerbation: Secondary | ICD-10-CM | POA: Diagnosis not present

## 2024-03-01 MED ORDER — ALBUTEROL SULFATE HFA 108 (90 BASE) MCG/ACT IN AERS: 2.0000 | INHALATION_SPRAY | Freq: Four times a day (QID) | RESPIRATORY_TRACT | 5 refills | Status: AC | PRN

## 2024-03-01 MED ORDER — METHYLPREDNISOLONE ACETATE 80 MG/ML IJ SUSP
80.0000 mg | Freq: Once | INTRAMUSCULAR | Status: AC
Start: 2024-03-01 — End: 2024-03-01
  Administered 2024-03-01 (×2): 80 mg via INTRAMUSCULAR

## 2024-03-01 NOTE — Patient Instructions (Signed)
 You had the steroid shot today  Please continue all other medications as before, and refills have been done if requested - the albuterol  inhaler  Please have the pharmacy call with any other refills you may need.  Please keep your appointments with your specialists as you may have planned

## 2024-03-01 NOTE — Assessment & Plan Note (Signed)
Mild to mod, for depomedrol 80 mg IM, to f/u any worsening symptoms or concerns 

## 2024-03-01 NOTE — Assessment & Plan Note (Signed)
 Lab Results  Component Value Date   VITAMINB12 343 10/03/2023   Stable, cont oral replacement - b12 1000 mcg qd

## 2024-03-01 NOTE — Telephone Encounter (Signed)
 Copied from CRM #[id number]. Topic: Clinical - Red Word Triage >> Mar 01, 2024  8:55 AM Willma SAUNDERS wrote: Red Word that prompted transfer to Nurse Triage: Patient has had shortness of breath and difficulty breathing. Has a productive cough with discolored mucous. Has had symptoms for about a week. Reason for Disposition  [1] Longstanding difficulty breathing AND [2] not responding to usual therapy  Answer Assessment - Initial Assessment Questions 1. RESPIRATORY STATUS: Describe your breathing? (e.g., wheezing, shortness of breath, unable to speak, severe coughing)      Mother Michelene calling in   On DPR He is having shortness of breath for a week now.  He is having congestion and bronchitis and allergies.   No runny nose or sore throat.    No fever 2. ONSET: When did this breathing problem begin?      A week now 3. PATTERN Does the difficult breathing come and go, or has it been constant since it started?      It comes and go.    4. SEVERITY: How bad is your breathing? (e.g., mild, moderate, severe)      He is getting more short of breath 5. RECURRENT SYMPTOM: Have you had difficulty breathing before? If Yes, ask: When was the last time? and What happened that time?      Yes Dr. Norleen knows about it.   He gets bronchitis often. 6. CARDIAC HISTORY: Do you have any history of heart disease? (e.g., heart attack, angina, bypass surgery, angioplasty)      Not asked 7. LUNG HISTORY: Do you have any history of lung disease?  (e.g., pulmonary embolus, asthma, emphysema)     Bronchitis  8. CAUSE: What do you think is causing the breathing problem?      Bronchitis and congestion 9. OTHER SYMPTOMS: Do you have any other symptoms? (e.g., chest pain, cough, dizziness, fever, runny nose)     No. 10. O2 SATURATION MONITOR:  Do you use an oxygen saturation monitor (pulse oximeter) at home? If Yes, ask: What is your reading (oxygen level) today? What is your usual oxygen saturation  reading? (e.g., 95%)       N/A 11. PREGNANCY: Is there any chance you are pregnant? When was your last menstrual period?       N/A 12. TRAVEL: Have you traveled out of the country in the last month? (e.g., travel history, exposures)       N/A  Protocols used: Breathing Difficulty-A-AH FYI Only or Action Required?: FYI only for provider.  Patient was last seen in primary care on 11/28/2023 by Norleen Lynwood ORN, MD.  Called Nurse Triage reporting Shortness of Breath.  Symptoms began a week ago.  Interventions attempted: OTC medications: Saline spray.  Symptoms are: gradually worsening.  Triage Disposition: See HCP Within 4 Hours (Or PCP Triage)  Patient/caregiver understands and will follow disposition?: Yes

## 2024-03-01 NOTE — Progress Notes (Signed)
 Chief Complaint: follow up allergies, asthma exacerbation, low b12 and D       HPI:  Scott Curtis is a 33 y.o. male here with mother; Does have several wks ongoing nasal allergy symptoms with clearish congestion, itch and sneezing, without fever, pain, ST, cough, swelling, but has had chest congestion and wheezing mild sob as well.   Pt denies polydipsia, polyuria, or new focal neuro s/s.   Pt denies fever, wt loss, night sweats, loss of appetite, or other constitutional symptoms  Pt will not take prednisone  pills for some reason,  mother hoping for depomedrol shot which he will accept.        Wt Readings from Last 3 Encounters:  03/01/24 (!) 303 lb 12.8 oz (137.8 kg)  12/26/23 (!) 302 lb 0.5 oz (137 kg)  11/28/23 (!) 302 lb (137 kg)   BP Readings from Last 3 Encounters:  03/01/24 112/84  12/26/23 111/78  11/28/23 124/78         Past Medical History:  Diagnosis Date   Development delay 09/14/2011   Impaired glucose tolerance 09/24/2012   Mental retardation 09/14/2011   Obesity 09/14/2011   Seizure disorder (HCC) 09/14/2011   Past Surgical History:  Procedure Laterality Date   NO PAST SURGERIES      reports that he has never smoked. He has never been exposed to tobacco smoke. He has never used smokeless tobacco. He reports that he does not drink alcohol and does not use drugs. family history includes Arthritis in an other family member; Cancer in an other family member; Hypertension in an other family member. Allergies  Allergen Reactions   Codeine Hives   Penicillin G Rash    Has patient had a PCN reaction causing immediate rash, facial/tongue/throat swelling, SOB or lightheadedness with hypotension: Unknown Has patient had a PCN reaction causing severe rash involving mucus membranes or skin necrosis: No Has patient had a PCN reaction that required hospitalization: No Has patient had a PCN reaction occurring within the last 10 years: No If all of the above answers are NO,  then may proceed with Cephalosporin use.    Current Outpatient Medications on File Prior to Visit  Medication Sig Dispense Refill   escitalopram  (LEXAPRO ) 20 MG tablet Take 1 tablet (20 mg total) by mouth daily. 90 tablet 3   fluocinonide  (LIDEX ) 0.05 % external solution Apply topically.     HYDROcodone  bit-homatropine (HYCODAN) 5-1.5 MG/5ML syrup Take 5 mLs by mouth every 6 (six) hours as needed for cough. 120 mL 0   montelukast  (SINGULAIR ) 10 MG tablet Take 1 tablet (10 mg total) by mouth at bedtime. 90 tablet 3   nystatin  (MYCOSTATIN /NYSTOP ) powder Use as directed twice per day as needed 60 g 1   triamcinolone  cream (KENALOG ) 0.1 % Apply topically.     lamoTRIgine (LAMICTAL) 100 MG tablet Take 100 mg by mouth daily. (Patient not taking: Reported on 03/01/2024)     No current facility-administered medications on file prior to visit.        ROS:  All others reviewed and negative.  Objective        PE:  BP 112/84   Pulse 62   Temp 98.2 F (36.8 C)   Ht 5' 1 (1.549 m)   Wt (!) 303 lb 12.8 oz (137.8 kg)   SpO2 96%   BMI 57.40 kg/m                 Constitutional: Pt  appears in NAD               HENT: Head: NCAT.                Right Ear: External ear normal.                 Left Ear: External ear normal. Bilat tm's with mild erythema.  Max sinus areas non tender.  Pharynx with mild erythema, no exudate               Eyes: . Pupils are equal, round, and reactive to light. Conjunctivae and EOM are normal               Nose: without d/c or deformity               Neck: Neck supple. Gross normal ROM               Cardiovascular: Normal rate and regular rhythm.                 Pulmonary/Chest: Effort normal and breath sounds without rales or wheezing.                              Neurological: Pt is alert. At baseline orientation, motor grossly intact               Skin: Skin is warm. No rashes, no other new lesions, LE edema - none               Psychiatric: Pt behavior is normal  without agitation   Micro: none  Cardiac tracings I have personally interpreted today:  none  Pertinent Radiological findings (summarize): none   Lab Results  Component Value Date   WBC 8.8 10/03/2023   HGB 14.5 10/03/2023   HCT 44.9 10/03/2023   PLT 313.0 10/03/2023   GLUCOSE 93 10/03/2023   CHOL 203 (H) 10/03/2023   TRIG 137.0 10/03/2023   HDL 45.30 10/03/2023   LDLCALC 131 (H) 10/03/2023   ALT 22 10/03/2023   AST 18 10/03/2023   NA 142 10/03/2023   K 4.0 10/03/2023   CL 101 10/03/2023   CREATININE 0.76 10/03/2023   BUN 10 10/03/2023   CO2 33 (H) 10/03/2023   TSH 2.37 10/03/2023   HGBA1C 6.2 10/03/2023   Assessment/Plan:  Scott Curtis is a 33 y.o. Black or African American [2] male with  has a past medical history of Development delay (09/14/2011), Impaired glucose tolerance (09/24/2012), Mental retardation (09/14/2011), Obesity (09/14/2011), and Seizure disorder (HCC) (09/14/2011).  Vitamin D  deficiency Last vitamin D  Lab Results  Component Value Date   VD25OH <7.00 (L) 10/03/2023   Low, to start oral replacement   B12 deficiency Lab Results  Component Value Date   VITAMINB12 343 10/03/2023   Stable, cont oral replacement - b12 1000 mcg qd   Asthma exacerbation Mild, also for albuterol  hfa prn restart  Allergic rhinitis Mild to mod, for depomedrol 80 mg IM,   to f/u any worsening symptoms or concerns  Followup: Return if symptoms worsen or fail to improve.  [name], MD 03/01/2024 7:40 PM [facility] [place] [facility] [facility] - [facility]

## 2024-03-01 NOTE — Assessment & Plan Note (Signed)
 Last vitamin D Lab Results  Component Value Date   VD25OH <7.00 (L) 10/03/2023   Low, to start oral replacement

## 2024-03-01 NOTE — Assessment & Plan Note (Signed)
 Mild, also for albuterol  hfa prn restart

## 2024-03-08 ENCOUNTER — Telehealth: Payer: Self-pay

## 2024-03-08 NOTE — Telephone Encounter (Signed)
 Copied from CRM [id number]. Topic: General - Other >> Mar 08, 2024 10:16 AM Scott Curtis wrote: Reason for CRM: Pt's mother Scott Curtis, Scott Curtis [phone] called on behalf of pt stated pt still having a lot of congestion. Pt would like something sent to pharmacy [facility] [phone] -[address] [place] [address] Phone: [phone] Fax: [phone], and Scott Curtis would like a call back at [phone].

## 2024-03-12 MED ORDER — PREDNISONE 10 MG PO TABS
ORAL_TABLET | ORAL | 0 refills | Status: DC
Start: 2024-03-12 — End: 2024-03-16

## 2024-03-12 MED ORDER — TRIAMCINOLONE ACETONIDE 55 MCG/ACT NA AERO: 2.0000 | INHALATION_SPRAY | Freq: Every day | NASAL | 12 refills | Status: AC

## 2024-03-12 NOTE — Telephone Encounter (Signed)
**Note De-identified  [place] Obfuscation** Please advise 

## 2024-03-12 NOTE — Telephone Encounter (Signed)
 Ok for prednisone  short rx and nasacort ; also can use zyrtec 10 mg every day prn if this does not make him sleepy

## 2024-03-16 MED ORDER — FEXOFENADINE HCL 180 MG PO TABS: 180.0000 mg | ORAL_TABLET | Freq: Every day | ORAL | 11 refills | Status: AC

## 2024-03-16 MED ORDER — METHYLPREDNISOLONE 4 MG PO TBPK
ORAL_TABLET | ORAL | 0 refills | Status: AC
Start: 2024-03-16 — End: ?

## 2024-03-16 NOTE — Telephone Encounter (Signed)
 Patient mother states that he can not take the prednisone  because he doesn't like it and wanted to know if there is anything else he can take and the zyrtec does make him sleepy majority if all allergy medication does

## 2024-03-16 NOTE — Telephone Encounter (Signed)
 Called back and relayed the message for the patient mom and she verbalized she understood

## 2024-03-16 NOTE — Telephone Encounter (Signed)
 Ok to try allegra  and medrol  pack - done erx

## 2024-03-24 DIAGNOSIS — H50111 Monocular exotropia, right eye: Secondary | ICD-10-CM | POA: Diagnosis not present

## 2024-03-24 DIAGNOSIS — R6250 Unspecified lack of expected normal physiological development in childhood: Secondary | ICD-10-CM | POA: Diagnosis not present

## 2024-03-24 DIAGNOSIS — H52223 Regular astigmatism, bilateral: Secondary | ICD-10-CM | POA: Diagnosis not present

## 2024-03-24 DIAGNOSIS — H5213 Myopia, bilateral: Secondary | ICD-10-CM | POA: Diagnosis not present

## 2024-04-09 ENCOUNTER — Encounter: Payer: Self-pay | Admitting: Internal Medicine

## 2024-04-09 ENCOUNTER — Ambulatory Visit: Admitting: Internal Medicine

## 2024-04-09 VITALS — BP 128/78 | HR 87 | Ht 61.0 in | Wt 305.0 lb

## 2024-04-09 DIAGNOSIS — J019 Acute sinusitis, unspecified: Secondary | ICD-10-CM | POA: Diagnosis not present

## 2024-04-09 DIAGNOSIS — E559 Vitamin D deficiency, unspecified: Secondary | ICD-10-CM

## 2024-04-09 DIAGNOSIS — R030 Elevated blood-pressure reading, without diagnosis of hypertension: Secondary | ICD-10-CM | POA: Diagnosis not present

## 2024-04-09 DIAGNOSIS — R739 Hyperglycemia, unspecified: Secondary | ICD-10-CM | POA: Diagnosis not present

## 2024-04-09 MED ORDER — AZITHROMYCIN 250 MG PO TABS: ORAL_TABLET | ORAL | 1 refills | Status: AC

## 2024-04-09 NOTE — Progress Notes (Signed)
 Patient ID: Scott Curtis, male   DOB: 10/22/1990, 33 y.o.   MRN: [id number]        Chief Complaint: follow up prod cough       HPI:  Scott Curtis is a 33 y.o. male Here with acute onset mild to mod 2-3 days ST, HA, general weakness and malaise, with prod cough greenish sputum, but Pt denies chest pain, increased sob or doe, wheezing, orthopnea, PND, increased LE swelling, palpitations, dizziness or syncope.   Pt denies polydipsia, polyuria, or new focal neuro s/s.    Pt denies fever, wt loss, night sweats, loss of appetite, or other constitutional symptoms  Benadryl  and claritin no help.  Has f/u for OSA soon.        Wt Readings from Last 3 Encounters:  04/09/24 (!) 305 lb (138.3 kg)  03/01/24 (!) 303 lb 12.8 oz (137.8 kg)  12/26/23 (!) 302 lb 0.5 oz (137 kg)   BP Readings from Last 3 Encounters:  04/09/24 128/78  03/01/24 112/84  12/26/23 111/78         Past Medical History:  Diagnosis Date   Development delay 09/14/2011   Impaired glucose tolerance 09/24/2012   Mental retardation 09/14/2011   Obesity 09/14/2011   Seizure disorder (HCC) 09/14/2011   Past Surgical History:  Procedure Laterality Date   NO PAST SURGERIES      reports that he has never smoked. He has never been exposed to tobacco smoke. He has never used smokeless tobacco. He reports that he does not drink alcohol and does not use drugs. family history includes Arthritis in an other family member; Cancer in an other family member; Hypertension in an other family member. Allergies  Allergen Reactions   Codeine Hives   Penicillin G Rash    Has patient had a PCN reaction causing immediate rash, facial/tongue/throat swelling, SOB or lightheadedness with hypotension: Unknown Has patient had a PCN reaction causing severe rash involving mucus membranes or skin necrosis: No Has patient had a PCN reaction that required hospitalization: No Has patient had a PCN reaction occurring within the last 10 years: No If all of the  above answers are NO, then may proceed with Cephalosporin use.    Current Outpatient Medications on File Prior to Visit  Medication Sig Dispense Refill   albuterol  (VENTOLIN  HFA) 108 (90 Base) MCG/ACT inhaler Inhale 2 puffs into the lungs every 6 (six) hours as needed for wheezing or shortness of breath. 8 g 5   escitalopram  (LEXAPRO ) 20 MG tablet Take 1 tablet (20 mg total) by mouth daily. 90 tablet 3   fexofenadine  (ALLEGRA ) 180 MG tablet Take 1 tablet (180 mg total) by mouth daily. 30 tablet 11   fluocinonide  (LIDEX ) 0.05 % external solution Apply topically.     HYDROcodone  bit-homatropine (HYCODAN) 5-1.5 MG/5ML syrup Take 5 mLs by mouth every 6 (six) hours as needed for cough. 120 mL 0   methylPREDNISolone  (MEDROL  DOSEPAK) 4 MG TBPK tablet 4 tab by mouth x 3 days, 3 days x 3 days, 1 tab x 3 days 21 tablet 0   montelukast  (SINGULAIR ) 10 MG tablet Take 1 tablet (10 mg total) by mouth at bedtime. 90 tablet 3   nystatin  (MYCOSTATIN /NYSTOP ) powder Use as directed twice per day as needed 60 g 1   triamcinolone  (NASACORT ) 55 MCG/ACT AERO nasal inhaler Place 2 sprays into the nose daily. 1 each 12   triamcinolone  cream (KENALOG ) 0.1 % Apply topically.     lamoTRIgine (LAMICTAL) 100  MG tablet Take 100 mg by mouth daily. (Patient not taking: Reported on 04/09/2024)     No current facility-administered medications on file prior to visit.        ROS:  All others reviewed and negative.  Objective        PE:  BP 128/78   Pulse 87   Ht 5' 1 (1.549 m)   Wt (!) 305 lb (138.3 kg)   SpO2 96%   BMI 57.63 kg/m                 Constitutional: Pt appears mild ill               HENT: Head: NCAT.                Right Ear: External ear normal.                 Left Ear: External ear normal. Bilat tm's with mild erythema.  Max sinus areas mild tender.  Pharynx with mild erythema, no exudate               Eyes: . Pupils are equal, round, and reactive to light. Conjunctivae and EOM are normal                Nose: without d/c or deformity               Neck: Neck supple. Gross normal ROM               Cardiovascular: Normal rate and regular rhythm.                 Pulmonary/Chest: Effort normal and breath sounds without rales or wheezing.                               Neurological: Pt is alert. At baseline orientation, motor grossly intact               Skin: Skin is warm. No rashes, no other new lesions, LE edema - none               Psychiatric: Pt behavior is normal without agitation   Micro: none  Cardiac tracings I have personally interpreted today:  none  Pertinent Radiological findings (summarize): none   Lab Results  Component Value Date   WBC 8.8 10/03/2023   HGB 14.5 10/03/2023   HCT 44.9 10/03/2023   PLT 313.0 10/03/2023   GLUCOSE 93 10/03/2023   CHOL 203 (H) 10/03/2023   TRIG 137.0 10/03/2023   HDL 45.30 10/03/2023   LDLCALC 131 (H) 10/03/2023   ALT 22 10/03/2023   AST 18 10/03/2023   NA 142 10/03/2023   K 4.0 10/03/2023   CL 101 10/03/2023   CREATININE 0.76 10/03/2023   BUN 10 10/03/2023   CO2 33 (H) 10/03/2023   TSH 2.37 10/03/2023   HGBA1C 6.2 10/03/2023   Assessment/Plan:  Scott Curtis is a 33 y.o. Black or African American [2] male with  has a past medical history of Development delay (09/14/2011), Impaired glucose tolerance (09/24/2012), Mental retardation (09/14/2011), Obesity (09/14/2011), and Seizure disorder (HCC) (09/14/2011).  Acute sinus infection Mild to mod, for antibx course zpack,  to f/u any worsening symptoms or concerns  Elevated blood-pressure reading without diagnosis of hypertension BP Readings from Last 3 Encounters:  04/09/24 128/78  03/01/24 112/84  12/26/23 111/78   Stable, pt to continue medical  treatment  - diet, wt control   Hyperglycemia Lab Results  Component Value Date   HGBA1C 6.2 10/03/2023   Stable, pt to continue current medical treatment  - diet, wt control   Vitamin D  deficiency Last vitamin D  Lab Results   Component Value Date   VD25OH <7.00 (L) 10/03/2023   Low, to start  oral replacement  Followup: Return if symptoms worsen or fail to improve.  [name], MD 04/09/2024 7:44 PM [facility] [place] [facility] - [facility] Internal Medicine

## 2024-04-09 NOTE — Patient Instructions (Signed)
 Please take all new medication as prescribed - the antibiotic  Remember delsym for cough if needed  Please continue all other medications as before, and refills have been done if requested.  Please have the pharmacy call with any other refills you may need.  Please keep your appointments with your specialists as you may have planned - Pulmonary soon for sleep apnea

## 2024-04-09 NOTE — Assessment & Plan Note (Signed)
 BP Readings from Last 3 Encounters:  04/09/24 128/78  03/01/24 112/84  12/26/23 111/78   Stable, pt to continue medical treatment  - diet, wt control

## 2024-04-09 NOTE — Assessment & Plan Note (Signed)
 Last vitamin D Lab Results  Component Value Date   VD25OH <7.00 (L) 10/03/2023   Low, to start oral replacement

## 2024-04-09 NOTE — Assessment & Plan Note (Signed)
Mild to mod, for antibx course zpack,  to f/u any worsening symptoms or concerns 

## 2024-04-09 NOTE — Assessment & Plan Note (Signed)
 Lab Results  Component Value Date   HGBA1C 6.2 10/03/2023   Stable, pt to continue current medical treatment  - diet,wt control

## 2024-04-28 DIAGNOSIS — G4733 Obstructive sleep apnea (adult) (pediatric): Secondary | ICD-10-CM | POA: Diagnosis not present

## 2024-05-10 ENCOUNTER — Encounter: Payer: Self-pay | Admitting: Primary Care

## 2024-05-10 ENCOUNTER — Ambulatory Visit (INDEPENDENT_AMBULATORY_CARE_PROVIDER_SITE_OTHER): Admitting: Primary Care

## 2024-05-10 VITALS — BP 128/87 | HR 97 | Temp 97.8°F | Ht 61.0 in | Wt 309.4 lb

## 2024-05-10 DIAGNOSIS — G4733 Obstructive sleep apnea (adult) (pediatric): Secondary | ICD-10-CM

## 2024-05-10 DIAGNOSIS — G4734 Idiopathic sleep related nonobstructive alveolar hypoventilation: Secondary | ICD-10-CM | POA: Diagnosis not present

## 2024-05-10 NOTE — Patient Instructions (Signed)
  VISIT SUMMARY: You came in today for your one-year follow-up regarding your severe sleep apnea. We discussed your current use of the CPAP machine, issues with air leaks, and your ongoing symptoms. We also reviewed your history of nasal congestion, obesity, and allergies.  YOUR PLAN: -OBSTRUCTIVE SLEEP APNEA: Obstructive sleep apnea is a condition where your airway becomes blocked during sleep, causing breathing pauses. Your current CPAP therapy is not working well due to air leaks and a high apnea score. We will arrange an in-lab sleep study to determine if a BiPAP machine, which uses different pressures for inhaling and exhaling, might be more effective for you.  -OBESITY: Obesity is a condition where excess body fat negatively affects your health. It is contributing to your sleep apnea. We recommend discussing weight loss strategies with your primary care provider and considering GLP-1 medications, which can help with weight loss, although you are hesitant about new treatments.  -ALLERGIC RHINITIS AND FOOD ALLERGY: Allergic rhinitis is inflammation of the inside of your nose caused by allergens, and food allergies are reactions to certain foods. You have intermittent symptoms that you manage with Benadryl . We suggest trying Nasacort  or over-the-counter antihistamines for better symptom control.  INSTRUCTIONS: We will arrange an in-lab sleep study to determine the optimal pressure settings for a potential BiPAP machine. Please discuss weight loss strategies and the possibility of GLP-1 medications with your primary care provider. Continue using Benadryl  as needed for allergy symptoms, and consider trying Nasacort  or over-the-counter antihistamines for better management.  Follow-up 3-6 months with Dr. Neda (new sleep patient- former Dr. Neysa)

## 2024-05-10 NOTE — Progress Notes (Signed)
 @Patient  ID: Scott Curtis, male    DOB: 06-15-1991, 33 y.o.   MRN: [id number]  Chief Complaint  Patient presents with   Obstructive Sleep Apnea    Pt's mom states the machine will not stay on all night long, it dont cut off but it just blows.     Referring provider: Norleen Lynwood ORN, MD  HPI: 33 year old male. Never smoked. PMH significant for seizure disorder, OSA, elevated blood pressure, vit D deficiency, developmental delay. Patient of Dr. Neysa, last seen on 04/27/20.  Previous [facility] pulmonary encounter: 04/27/20 - 29 yoM never smoker for OSA, Nocturnal Hypoxemia, complicated by  Allergic Rhinitis, Mental Retardation ( congenital syndrome), Seizure Disorder, Chronic Otitis, Chronic Sinusitis, Obesity Seborrheic Dermatitis, Asthma Mother  provides information. Scott Curtis is non-verbal. CPAP auto 5-20/ [employer]   Desensitization mask fitting 11/10/19 Download- compliance 100%, AHI 37.4/ hr Body weight today- 291 lbs Covid vax- 2 Phizer Flu vax- declines Mother has not felt he would tolerate in-patient sleep study for BIPAP titration and confirmed this again today. Weight gain blamed on Lexapro . She says he rolls over a lot, dislodging mask and hose. We discussed high breakthrough apnea index. Given dificult situation and his limited ability to understand and communicate, we agreed to continue as we are doing.   06/10/2022 Patient presents today for overdue OSA/CPAP follow-up. Patient has legal guardian. He was last seen by our office in 2021. He is needing CPAP supplies renewed. He is sleeping well at night. He reports no issues with mask fit or pressure settings. He wears full face mask, due to be changed. He is having large amount of air leask on download with residual AHI 35/hour. This is similar to 2021. At that time it was recommended patient had CPAP titration but his mother did not wish to pursue this. She is open to having it done now. No daytime sleepiness. Dme company is [employer].    Airview download 05/08/22-06/06/22 Average usage 12 hours 36 mins Pressure 5-20cm h20 (14.1cm h20- 95%) Airleaks 79L/min (95%) AHI 35.6    05/10/2024- Interim hx  Discussed the use of AI scribe software for clinical note transcription with the patient, who gave verbal consent to proceed.  History of Present Illness Scott Curtis Scott Curtis is a 33 year old male with sleep apnea who presents for a one-year follow-up. Accompanied by his mother, patient as severe developmental delay.   He has a history of severe sleep apnea, diagnosed in 2020, with a home sleep study showing 30.3 apneic events per hour. He uses an auto CPAP machine but experiences significant issues with air leaks and frequently removes the mask during the night. His apnea score has increased to 43/hour despite usage.   He experiences nasal congestion, which has been a persistent issue since childhood. He does not use Nasacort , Allegra , or Singulair , but occasionally uses saline and Benadryl  for relief. He has a history of allergies and sometimes uses an albuterol  inhaler for respiratory symptoms. No current issues with nasal breathing.  He has a history of obesity, which contributes to his obstructive sleep apnea. His weight was 279 pounds in 2020 with current weight 209. He has not been a candidate for surgical interventions for OSA due to his BMI 58. He does not currently take any medications for weight management.  He has a history of seizure disorder from childhood. No recent seizures. Patient is on Lamictal.       Allergies  Allergen Reactions   Codeine Hives  Penicillin G Rash    Has patient had a PCN reaction causing immediate rash, facial/tongue/throat swelling, SOB or lightheadedness with hypotension: Unknown Has patient had a PCN reaction causing severe rash involving mucus membranes or skin necrosis: No Has patient had a PCN reaction that required hospitalization: No Has patient had a PCN reaction  occurring within the last 10 years: No If all of the above answers are NO, then may proceed with Cephalosporin use.     Immunization History  Administered Date(s) Administered   PFIZER(Purple Top)SARS-COV-2 Vaccination 11/24/2019, 12/27/2019   Td 06/15/2004    Past Medical History:  Diagnosis Date   Development delay 09/14/2011   Impaired glucose tolerance 09/24/2012   Mental retardation 09/14/2011   Obesity 09/14/2011   Seizure disorder (HCC) 09/14/2011    Tobacco History: Social History   Tobacco Use  Smoking Status Never   Passive exposure: Never  Smokeless Tobacco Never   Counseling given: Not Answered   Outpatient Medications Prior to Visit  Medication Sig Dispense Refill   albuterol  (VENTOLIN  HFA) 108 (90 Base) MCG/ACT inhaler Inhale 2 puffs into the lungs every 6 (six) hours as needed for wheezing or shortness of breath. 8 g 5   escitalopram  (LEXAPRO ) 20 MG tablet Take 1 tablet (20 mg total) by mouth daily. 90 tablet 3   HYDROcodone  bit-homatropine (HYCODAN) 5-1.5 MG/5ML syrup Take 5 mLs by mouth every 6 (six) hours as needed for cough. 120 mL 0   triamcinolone  cream (KENALOG ) 0.1 % Apply topically.     fexofenadine  (ALLEGRA ) 180 MG tablet Take 1 tablet (180 mg total) by mouth daily. (Patient not taking: Reported on 05/10/2024) 30 tablet 11   fluocinonide  (LIDEX ) 0.05 % external solution Apply topically. (Patient not taking: Reported on 05/10/2024)     lamoTRIgine (LAMICTAL) 100 MG tablet Take 100 mg by mouth daily. (Patient not taking: Reported on 05/10/2024)     methylPREDNISolone  (MEDROL  DOSEPAK) 4 MG TBPK tablet 4 tab by mouth x 3 days, 3 days x 3 days, 1 tab x 3 days (Patient not taking: Reported on 05/10/2024) 21 tablet 0   montelukast  (SINGULAIR ) 10 MG tablet Take 1 tablet (10 mg total) by mouth at bedtime. (Patient not taking: Reported on 05/10/2024) 90 tablet 3   nystatin  (MYCOSTATIN /NYSTOP ) powder Use as directed twice per day as needed (Patient not taking:  Reported on 05/10/2024) 60 g 1   triamcinolone  (NASACORT ) 55 MCG/ACT AERO nasal inhaler Place 2 sprays into the nose daily. (Patient not taking: Reported on 05/10/2024) 1 each 12   No facility-administered medications prior to visit.   Review of Systems  Review of Systems  Constitutional: Negative.   HENT:  Positive for congestion.   Respiratory: Negative.  Negative for shortness of breath.    Physical Exam  BP 128/87   Pulse 97   Temp 97.8 F (36.6 C)   Ht 5' 1 (1.549 m)   Wt (!) 309 lb 6.4 oz (140.3 kg)   SpO2 97% Comment: ra  BMI 58.46 kg/m  Physical Exam Constitutional:      Appearance: Normal appearance. He is obese.  HENT:     Head: Normocephalic and atraumatic.     Mouth/Throat:     Pharynx: Oropharynx is clear.  Neck:     Comments: Large/thick neck Cardiovascular:     Rate and Rhythm: Normal rate and regular rhythm.  Pulmonary:     Effort: Pulmonary effort is normal.     Breath sounds: Normal breath sounds.  Musculoskeletal:  General: Normal range of motion.  Skin:    General: Skin is warm and dry.  Neurological:     General: No focal deficit present.     Mental Status: He is alert and oriented to person, place, and time. Mental status is at baseline.  Psychiatric:        Mood and Affect: Mood normal.        Behavior: Behavior normal.        Thought Content: Thought content normal.        Judgment: Judgment normal.     Lab Results:  CBC    Component Value Date/Time   WBC 8.8 10/03/2023 1504   RBC 5.24 10/03/2023 1504   HGB 14.5 10/03/2023 1504   HCT 44.9 10/03/2023 1504   PLT 313.0 10/03/2023 1504   MCV 85.8 10/03/2023 1504   MCH 29.2 08/11/2017 2022   MCHC 32.2 10/03/2023 1504   RDW 15.2 10/03/2023 1504   LYMPHSABS 2.0 10/03/2023 1504   MONOABS 0.7 10/03/2023 1504   EOSABS 0.1 10/03/2023 1504   BASOSABS 0.1 10/03/2023 1504    BMET    Component Value Date/Time   NA 142 10/03/2023 1504   K 4.0 10/03/2023 1504   CL 101  10/03/2023 1504   CO2 33 (H) 10/03/2023 1504   GLUCOSE 93 10/03/2023 1504   BUN 10 10/03/2023 1504   CREATININE 0.76 10/03/2023 1504   CALCIUM 10.0 10/03/2023 1504   GFRNONAA >60 08/11/2017 2022   GFRAA >60 08/11/2017 2022    BNP No results found for: BNP  ProBNP No results found for: PROBNP  Imaging: No results found.   Assessment & Plan:   1. Obstructive sleep apnea (Primary) - Cpap titration; Future  2. Nocturnal hypoxemia  Assessment & Plan Obstructive sleep apnea, severe, suboptimally controlled on CPAP Severe obstructive sleep apnea with 30.3 apneic events per hour on a 2020 home sleep study. Current CPAP therapy is suboptimal due to air leaks and high apnea score of 43. He frequently removes the mask during sleep, indicating poor compliance or discomfort. In-lab sleep study required to determine optimal pressure settings and appropriate therapy. - Will arrange in-lab sleep study for CPAP titration to determine optimal pressure settings vs BIPAP need.  Obesity contributing to obstructive sleep apnea Obesity is contributing to obstructive sleep apnea. Current BMI precludes candidacy for Inspire surgical implantation. Weight loss is recommended to improve sleep apnea management. Discussion of GLP-1 medications for weight loss, patients guardian is not interested in weight loss medication at this time - Encourage weight loss through diet and exercise  Allergic rhinitis with intermittent symptoms Intermittent symptoms of allergic rhinitis and food allergies. No current use of Nasacort  or antihistamines. Symptoms managed with occasional Benadryl . No significant nasal obstruction reported. - Consider trial of Nasacort  or over-the-counter antihistamines for symptom management. - Continue Benadryl  as needed for acute allergy symptoms.   Almarie LELON Ferrari, NP 05/10/2024

## 2024-07-07 ENCOUNTER — Ambulatory Visit (INDEPENDENT_AMBULATORY_CARE_PROVIDER_SITE_OTHER)

## 2024-07-07 ENCOUNTER — Ambulatory Visit: Admission: EM | Admit: 2024-07-07 | Discharge: 2024-07-07 | Disposition: A | Discharge status: Home / Self Care

## 2024-07-07 ENCOUNTER — Ambulatory Visit: Payer: Self-pay

## 2024-07-07 ENCOUNTER — Encounter: Payer: Self-pay | Admitting: Emergency Medicine

## 2024-07-07 DIAGNOSIS — R051 Acute cough: Secondary | ICD-10-CM

## 2024-07-07 DIAGNOSIS — J019 Acute sinusitis, unspecified: Secondary | ICD-10-CM

## 2024-07-07 MED ORDER — OLOPATADINE HCL 0.2 % OP SOLN: 1.0000 [drp] | Freq: Two times a day (BID) | OPHTHALMIC | 0 refills | Status: AC

## 2024-07-07 MED ORDER — DOXYCYCLINE HYCLATE 100 MG PO CAPS: 100.0000 mg | ORAL_CAPSULE | Freq: Two times a day (BID) | ORAL | 0 refills | Status: AC

## 2024-07-07 NOTE — Telephone Encounter (Signed)
 Attempted to return call to pt but NA, LM to return call. Attempt #1   Copied from CRM #[id number]. Topic: Clinical - Red Word Triage >> Jul 07, 2024 10:33 AM Joesph NOVAK wrote: Red Word that prompted transfer to Nurse Triage: Wheezing, puffy eyes, breathing heavy. >> Jul 07, 2024 10:46 AM Joesph B wrote: Patients mother hung up twice while waiting.

## 2024-07-07 NOTE — Telephone Encounter (Signed)
 Patient seen in UC today for symptoms.   Copied from CRM #[id number]. Topic: Clinical - Red Word Triage >> Jul 07, 2024 10:33 AM Joesph NOVAK wrote: Red Word that prompted transfer to Nurse Triage: Wheezing, puffy eyes, breathing heavy. >> Jul 07, 2024 10:46 AM Joesph B wrote: Patients mother hung up twice while waiting.

## 2024-07-07 NOTE — ED Provider Notes (Signed)
 [facility]    CSN: [id number] Arrival date & time: 07/07/24  1208      History   Chief Complaint Chief Complaint  Patient presents with   Nasal Congestion   Chest Congestion    HPI Scott Curtis is a 33 y.o. male.   Pt presents with mother, due to productive cough, wheezing, shortness of breath, fatigue, and nasal congestion for 1 week. Mom states that she has been giving her hydrocodone  for his cough with relief. Pt denies that patient has had fever or change in appetite.   The history is provided by the patient.    Past Medical History:  Diagnosis Date   Development delay 09/14/2011   Impaired glucose tolerance 09/24/2012   Mental retardation 09/14/2011   Obesity 09/14/2011   Seizure disorder (HCC) 09/14/2011    Patient Active Problem List   Diagnosis Date Noted   Asthma exacerbation 11/29/2023   Effusion, left knee 11/29/2023   Prepatellar bursitis 10/03/2023   HLD (hyperlipidemia) 10/03/2023   Rash 05/31/2023   Congenital exotropia of right eye 11/04/2022   Watering of left eye 08/23/2022   Gynecomastia, male 06/03/2022   Impacted ear wax 08/23/2021   Hyperglycemia 08/23/2021   B12 deficiency 08/23/2021   Vitamin D  deficiency 08/23/2021   Boil, leg 02/24/2021   Nocturnal hypoxemia 07/11/2019   Cough 03/31/2019   Right conjunctivitis 03/02/2019   Acute sinus infection 12/06/2018   Obstructive sleep apnea 08/19/2018   Left ear pain 04/16/2018   Left inguinal hernia 10/16/2017   Irritability and anger 08/13/2017   External otitis of right ear 08/13/2017   Lichen simplex chronicus 11/21/2016   Seborrheic dermatitis 11/21/2016   Elevated blood-pressure reading without diagnosis of hypertension 09/15/2016   Scalp lesion 09/10/2016   Bilateral impacted cerumen 08/21/2016   Chronic mycotic otitis externa 08/21/2016   Rash and nonspecific skin eruption 04/06/2015   Allergic rhinitis 10/26/2014   Acne 09/24/2012   Allergic conjunctivitis  09/17/2011   Seizure disorder (HCC) 09/14/2011   Encounter for well adult exam with abnormal findings 09/14/2011   Development delay 09/14/2011   Intellectual disability 09/14/2011   Obesity 09/14/2011    Past Surgical History:  Procedure Laterality Date   NO PAST SURGERIES         Home Medications    Prior to Admission medications  Medication Sig Start Date End Date Taking? Authorizing Provider  Olopatadine  HCl 0.2 % SOLN Apply 1 drop to eye. 03/24/24  Yes [provider]  albuterol  (VENTOLIN  HFA) 108 (90 Base) MCG/ACT inhaler Inhale 2 puffs into the lungs every 6 (six) hours as needed for wheezing or shortness of breath. 03/01/24   Norleen Lynwood ORN, MD  escitalopram  (LEXAPRO ) 20 MG tablet Take 1 tablet (20 mg total) by mouth daily. 10/03/23   Norleen Lynwood ORN, MD  fexofenadine  (ALLEGRA ) 180 MG tablet Take 1 tablet (180 mg total) by mouth daily. Patient not taking: Reported on 05/10/2024 03/16/24 03/16/25  Norleen Lynwood ORN, MD  fluocinonide  (LIDEX ) 0.05 % external solution Apply topically. Patient not taking: Reported on 05/10/2024 02/05/21   [provider]  HYDROcodone  bit-homatropine (HYCODAN) 5-1.5 MG/5ML syrup Take 5 mLs by mouth every 6 (six) hours as needed for cough. 12/26/23   Banister, Pamela K, MD  lamoTRIgine (LAMICTAL) 100 MG tablet Take 100 mg by mouth daily. Patient not taking: Reported on 05/10/2024    [provider]  methylPREDNISolone  (MEDROL  DOSEPAK) 4 MG TBPK tablet 4 tab by mouth x 3 days, 3  days x 3 days, 1 tab x 3 days Patient not taking: Reported on 05/10/2024 03/16/24   Norleen Lynwood ORN, MD  montelukast  (SINGULAIR ) 10 MG tablet Take 1 tablet (10 mg total) by mouth at bedtime. Patient not taking: Reported on 05/10/2024 11/28/23   Norleen Lynwood ORN, MD  nystatin  (MYCOSTATIN /NYSTOP ) powder Use as directed twice per day as needed Patient not taking: Reported on 05/10/2024 10/03/23   Norleen Lynwood ORN, MD  triamcinolone  (NASACORT ) 55 MCG/ACT AERO nasal inhaler  Place 2 sprays into the nose daily. Patient not taking: Reported on 05/10/2024 03/12/24   Norleen Lynwood ORN, MD  triamcinolone  cream (KENALOG ) 0.1 % Apply topically. 02/05/21   [provider]    Family History Family History  Problem Relation Age of Onset   Arthritis Other    Hypertension Other    Cancer Other        breast cancer   Neuropathy Neg Hx     Social History Social History[1]   Allergies   Codeine and Penicillin g   Review of Systems Review of Systems   Physical Exam Triage Vital Signs ED Triage Vitals  Encounter Vitals Group     BP 07/07/24 1322 (!) 157/95     Girls Systolic BP Percentile --      Girls Diastolic BP Percentile --      Boys Systolic BP Percentile --      Boys Diastolic BP Percentile --      Pulse Rate 07/07/24 1322 75     Resp 07/07/24 1322 20     Temp 07/07/24 1322 98.8 F (37.1 C)     Temp Source 07/07/24 1322 Oral     SpO2 07/07/24 1322 94 %     Weight 07/07/24 1320 (!) 309 lb 4.9 oz (140.3 kg)     Height --      Head Circumference --      Peak Flow --      Pain Score 07/07/24 1319 0     Pain Loc --      Pain Education --      Exclude from Growth Chart --    No data found.  Updated Vital Signs BP (!) 157/95 (BP Location: Left Arm)   Pulse 75   Temp 98.8 F (37.1 C) (Oral)   Resp 20   Wt (!) 309 lb 4.9 oz (140.3 kg)   SpO2 94%   BMI 58.44 kg/m   Visual Acuity Right Eye Distance:   Left Eye Distance:   Bilateral Distance:    Right Eye Near:   Left Eye Near:    Bilateral Near:     Physical Exam Vitals and nursing note reviewed.  Constitutional:      General: He is not in acute distress.    Appearance: Normal appearance. He is not ill-appearing, toxic-appearing or diaphoretic.  HENT:     Nose: Congestion (moderately enlarged turbinates) present. No rhinorrhea.     Right Sinus: Maxillary sinus tenderness present. No frontal sinus tenderness.     Left Sinus: Maxillary sinus tenderness present. No frontal  sinus tenderness.     Comments: Tenderness to palpation of upper lip    Mouth/Throat:     Mouth: Mucous membranes are moist.     Pharynx: Oropharynx is clear. No oropharyngeal exudate or posterior oropharyngeal erythema.  Eyes:     General: No scleral icterus. Cardiovascular:     Rate and Rhythm: Normal rate and regular rhythm.     Heart sounds: Normal heart  sounds.  Pulmonary:     Effort: Pulmonary effort is normal. No respiratory distress.     Breath sounds: Normal breath sounds. No wheezing or rhonchi.  Skin:    General: Skin is warm.  Neurological:     Mental Status: He is alert and oriented to person, place, and time.  Psychiatric:        Mood and Affect: Mood normal.        Behavior: Behavior normal.      UC Treatments / Results  Labs (all labs ordered are listed, but only abnormal results are displayed) Labs Reviewed - No data to display  EKG   Radiology No results found.  Procedures Procedures (including critical care time)  Medications Ordered in UC Medications - No data to display  Initial Impression / Assessment and Plan / UC Course  I have reviewed the triage vital signs and the nursing notes.  Pertinent labs & imaging results that were available during my care of the patient were reviewed by me and considered in my medical decision making (see chart for details).    Final Clinical Impressions(s) / UC Diagnoses   Final diagnoses:  Acute cough   Discharge Instructions   None    ED Prescriptions   None    PDMP not reviewed this encounter.    [1]  Social History Tobacco Use   Smoking status: Never    Passive exposure: Never   Smokeless tobacco: Never  Vaping Use   Vaping status: Never Used  Substance Use Topics   Alcohol use: No   Drug use: No     Andra Corean BROCKS, PA-C 07/07/24 1337

## 2024-07-07 NOTE — ED Triage Notes (Addendum)
 Pt presents with Scott Curtis, mother of the pt. Pt is c/o Sinus infection, chest congestion, and nasal congestion x a while. Pt states,  He been breathing like a dragon for a while and I need to get him checked out. He not missing no meals but that congestion has me concerned.  Pt denies emesis and diarrhea.

## 2024-07-07 NOTE — Discharge Instructions (Addendum)

## 2024-07-13 ENCOUNTER — Ambulatory Visit (HOSPITAL_BASED_OUTPATIENT_CLINIC_OR_DEPARTMENT_OTHER): Admitting: Pulmonary Disease

## 2024-10-11 ENCOUNTER — Ambulatory Visit: Admitting: Pulmonary Disease
